# Patient Record
Sex: Male | Born: 1975 | Hispanic: No | Smoking: Current every day smoker
Health system: Southern US, Community
[De-identification: ages and names within clinical notes are randomized; demographics above are authoritative.]

## PROBLEM LIST (undated history)

## (undated) DIAGNOSIS — N189 Chronic kidney disease, unspecified: Secondary | ICD-10-CM

## (undated) DIAGNOSIS — C649 Malignant neoplasm of unspecified kidney, except renal pelvis: Secondary | ICD-10-CM

## (undated) DIAGNOSIS — M1990 Unspecified osteoarthritis, unspecified site: Secondary | ICD-10-CM

## (undated) DIAGNOSIS — I10 Essential (primary) hypertension: Secondary | ICD-10-CM

## (undated) DIAGNOSIS — F329 Major depressive disorder, single episode, unspecified: Secondary | ICD-10-CM

## (undated) DIAGNOSIS — G8929 Other chronic pain: Secondary | ICD-10-CM

## (undated) DIAGNOSIS — M109 Gout, unspecified: Secondary | ICD-10-CM

## (undated) DIAGNOSIS — E119 Type 2 diabetes mellitus without complications: Secondary | ICD-10-CM

## (undated) DIAGNOSIS — M25569 Pain in unspecified knee: Secondary | ICD-10-CM

## (undated) DIAGNOSIS — R569 Unspecified convulsions: Secondary | ICD-10-CM

## (undated) DIAGNOSIS — C801 Malignant (primary) neoplasm, unspecified: Secondary | ICD-10-CM

## (undated) DIAGNOSIS — G2581 Restless legs syndrome: Secondary | ICD-10-CM

## (undated) DIAGNOSIS — G629 Polyneuropathy, unspecified: Secondary | ICD-10-CM

## (undated) DIAGNOSIS — J45909 Unspecified asthma, uncomplicated: Secondary | ICD-10-CM

## (undated) DIAGNOSIS — F32A Depression, unspecified: Secondary | ICD-10-CM

## (undated) DIAGNOSIS — J449 Chronic obstructive pulmonary disease, unspecified: Secondary | ICD-10-CM

## (undated) HISTORY — PX: OTHER SURGICAL HISTORY: SHX169

## (undated) HISTORY — PX: PARTIAL NEPHRECTOMY: SHX414

## (undated) HISTORY — PX: KNEE SURGERY: SHX244

## (undated) HISTORY — PX: SEPTOPLASTY: SUR1290

## (undated) HISTORY — DX: Pain in unspecified knee: M25.569

## (undated) HISTORY — DX: Malignant neoplasm of unspecified kidney, except renal pelvis: C64.9

## (undated) HISTORY — DX: Other chronic pain: G89.29

## (undated) HISTORY — DX: Unspecified osteoarthritis, unspecified site: M19.90

---

## 2012-03-04 ENCOUNTER — Encounter: Payer: Self-pay | Admitting: Internal Medicine

## 2012-03-16 DIAGNOSIS — R569 Unspecified convulsions: Secondary | ICD-10-CM

## 2014-06-20 ENCOUNTER — Encounter (HOSPITAL_COMMUNITY): Payer: Self-pay | Admitting: *Deleted

## 2014-06-20 DIAGNOSIS — Z880 Allergy status to penicillin: Secondary | ICD-10-CM | POA: Diagnosis not present

## 2014-06-20 DIAGNOSIS — Z720 Tobacco use: Secondary | ICD-10-CM | POA: Insufficient documentation

## 2014-06-20 DIAGNOSIS — S4992XA Unspecified injury of left shoulder and upper arm, initial encounter: Secondary | ICD-10-CM | POA: Insufficient documentation

## 2014-06-20 DIAGNOSIS — I10 Essential (primary) hypertension: Secondary | ICD-10-CM | POA: Diagnosis not present

## 2014-06-20 DIAGNOSIS — S199XXA Unspecified injury of neck, initial encounter: Secondary | ICD-10-CM | POA: Diagnosis not present

## 2014-06-20 DIAGNOSIS — S0990XA Unspecified injury of head, initial encounter: Secondary | ICD-10-CM | POA: Insufficient documentation

## 2014-06-20 DIAGNOSIS — S3992XA Unspecified injury of lower back, initial encounter: Secondary | ICD-10-CM | POA: Insufficient documentation

## 2014-06-20 DIAGNOSIS — W07XXXA Fall from chair, initial encounter: Secondary | ICD-10-CM | POA: Diagnosis not present

## 2014-06-20 DIAGNOSIS — G8929 Other chronic pain: Secondary | ICD-10-CM | POA: Diagnosis not present

## 2014-06-20 DIAGNOSIS — M1990 Unspecified osteoarthritis, unspecified site: Secondary | ICD-10-CM | POA: Diagnosis not present

## 2014-06-20 DIAGNOSIS — G629 Polyneuropathy, unspecified: Secondary | ICD-10-CM | POA: Insufficient documentation

## 2014-06-20 DIAGNOSIS — Y998 Other external cause status: Secondary | ICD-10-CM | POA: Insufficient documentation

## 2014-06-20 DIAGNOSIS — J449 Chronic obstructive pulmonary disease, unspecified: Secondary | ICD-10-CM | POA: Diagnosis not present

## 2014-06-20 DIAGNOSIS — E119 Type 2 diabetes mellitus without complications: Secondary | ICD-10-CM | POA: Insufficient documentation

## 2014-06-20 DIAGNOSIS — Y9389 Activity, other specified: Secondary | ICD-10-CM | POA: Diagnosis not present

## 2014-06-20 DIAGNOSIS — Y92008 Other place in unspecified non-institutional (private) residence as the place of occurrence of the external cause: Secondary | ICD-10-CM | POA: Diagnosis not present

## 2014-06-20 NOTE — ED Notes (Addendum)
Pt fell out of a transfer chair - backwards ,  Pain back of neck , head, lower back ,  Headache,  ? Loc.  Alert now.   Pt outside smoking when called.  C collar placed on pt

## 2014-06-20 NOTE — ED Notes (Signed)
Pt outside , had to be called by visitor

## 2014-06-21 ENCOUNTER — Emergency Department (HOSPITAL_COMMUNITY): Payer: Medicaid Other

## 2014-06-21 ENCOUNTER — Emergency Department (HOSPITAL_COMMUNITY)
Admission: EM | Admit: 2014-06-21 | Discharge: 2014-06-21 | Discharge status: Against Medical Advice | Payer: Medicaid Other | Attending: Emergency Medicine | Admitting: Emergency Medicine

## 2014-06-21 DIAGNOSIS — W19XXXA Unspecified fall, initial encounter: Secondary | ICD-10-CM

## 2014-06-21 HISTORY — DX: Type 2 diabetes mellitus without complications: E11.9

## 2014-06-21 HISTORY — DX: Essential (primary) hypertension: I10

## 2014-06-21 HISTORY — DX: Unspecified osteoarthritis, unspecified site: M19.90

## 2014-06-21 HISTORY — DX: Polyneuropathy, unspecified: G62.9

## 2014-06-21 HISTORY — DX: Chronic obstructive pulmonary disease, unspecified: J44.9

## 2014-06-21 HISTORY — DX: Unspecified asthma, uncomplicated: J45.909

## 2014-06-21 LAB — CBG MONITORING, ED: GLUCOSE-CAPILLARY: 189 mg/dL — AB (ref 70–99)

## 2014-06-21 MED ORDER — CYCLOBENZAPRINE HCL 10 MG PO TABS
10.0000 mg | ORAL_TABLET | Freq: Once | ORAL | Status: AC
  Administered 2014-06-21: 10 mg via ORAL
  Filled 2014-06-21: qty 1

## 2014-06-21 MED ORDER — NAPROXEN 250 MG PO TABS
500.0000 mg | ORAL_TABLET | Freq: Once | ORAL | Status: AC
  Administered 2014-06-21: 500 mg via ORAL
  Filled 2014-06-21: qty 2

## 2014-06-21 NOTE — ED Notes (Signed)
Pt left after seeing EDP. States had to get his daughter & wife has court in the morning. Advised to return if any problems.

## 2014-06-21 NOTE — ED Notes (Signed)
Pt decided that he was going to have to leave after he got his medications. Pt states he had to get his daughter from grandmothers & his wife has court in the morning. Pt advised he can return at anytime to be seen if needed. Wife said she would make sure he comes back if having other issues or problems.

## 2014-06-21 NOTE — ED Provider Notes (Signed)
CSN: [id number]     Arrival date & time 06/20/14  2049 History   First MD Initiated Contact with Patient 06/21/14 [phone]     Chief Complaint  Patient presents with  . Fall     (Consider location/radiation/quality/duration/timing/severity/associated sxs/prior Treatment) HPI  Patient reports about 5 PM he was sitting in a transfer chair on his porch watching his daughter. He states one of the wheels was broken and when he started to get up out of the chair it fell backwards. He states he hit the back of his head. He is unsure of loss of consciousness. He denies nausea or vomiting but states he feels lightheaded. He has a throbbing posterior headache and also pain in his left temple which is different from his prior migraine headaches. He complains of neck pain. He also complains of pain in his left shoulder and in his lower back. Patient has chronic numbness from neuropathy and states it is unchanged. He states he has chronic low back pain from pinched nerves in his back and he goes to a pain management clinic for that. Patient states his CBGs normally run in the 150-189 range.  PCP Dr Woody [facility]  Past Medical History  Diagnosis Date  . COPD (chronic obstructive pulmonary disease)   . Diabetes mellitus without complication   . Arthritis   . Asthma   . Neuropathy   . Hypertension    Past Surgical History  Procedure Laterality Date  . Carpal tunnel release    . Pinched nerves in back     History reviewed. No pertinent family history. History  Substance Use Topics  . Smoking status: Current Every Day Smoker  . Smokeless tobacco: Not on file  . Alcohol Use: No  applying for disability  Patient uses a cane and sometimes a wheelchair for ambulation. He states he has arthritis in his knees. Patient was smoking 3 packs per day, he's down to 1 pack a day.  Review of Systems  All other systems reviewed and are negative.     Allergies  Penicillins  Home  Medications   Prior to Admission medications   Not on File   BP 118/78 mmHg  Pulse 87  Temp(Src) 98 F (36.7 C) (Oral)  Resp 16  Ht 5\' 11"  (1.803 m)  Wt 225 lb (102.059 kg)  BMI 31.39 kg/m2  SpO2 97%  Vital signs normal   Physical Exam  Constitutional: He is oriented to person, place, and time. He appears well-developed and well-nourished.  Non-toxic appearance. He does not appear ill. No distress.  HENT:  Head: Normocephalic and atraumatic.    Right Ear: External ear normal.  Left Ear: External ear normal.  Nose: Nose normal. No mucosal edema or rhinorrhea.  Mouth/Throat: Oropharynx is clear and moist and mucous membranes are normal. No dental abscesses or uvula swelling.  Patient has tenderness of his posterior scalp at the level of the top of the c-collar.  Eyes: Conjunctivae and EOM are normal. Pupils are equal, round, and reactive to light.  Neck: Full passive range of motion without pain.  Patient in a c-collar.  Cardiovascular: Normal rate, regular rhythm and normal heart sounds.  Exam reveals no gallop and no friction rub.   No murmur heard. Pulmonary/Chest: Effort normal and breath sounds normal. No respiratory distress. He has no wheezes. He has no rhonchi. He has no rales. He exhibits no tenderness and no crepitus.  Abdominal: Soft. Normal appearance and bowel sounds are normal. He  exhibits no distension. There is no tenderness. There is no rebound and no guarding.  Musculoskeletal: Normal range of motion. He exhibits no edema or tenderness.       Arms: Moves all extremities well. Patient does not have pain in his left shoulder joint but states he has pain in the left trapezius that radiates up into the left side of his neck when he moves his left arm. He also is tender over his left clavicle without deformity or crepitance. There is no swelling seen. There is no bruising seen to his back. He is tender diffusely his whole lumbar spine and over the left SI joint. His  thoracic spine is nontender.  Neurological: He is alert and oriented to person, place, and time. He has normal strength. No cranial nerve deficit.  Skin: Skin is warm, dry and intact. No rash noted. No erythema. No pallor.  Psychiatric: He has a normal mood and affect. His speech is normal and behavior is normal. His mood appears not anxious.  Nursing note and vitals reviewed.   ED Course  Procedures (including critical care time)  Medications  naproxen (NAPROSYN) tablet 500 mg (500 mg Oral Given 06/21/14 0257)  cyclobenzaprine (FLEXERIL) tablet 10 mg (10 mg Oral Given 06/21/14 0257)     Patient was fine when I left the room. However he was informed by nursing staff that x-ray was slow tonight getting the results back. Patient decided he did not want to wait. He states his wife has a court appearance 9:30 this morning. Pt was seen walking out of the ED without his cervical collar.   Review of the [place] shows patient gets #120 hydrocodone 7.5/325 monthly, the last was filled on November 27. Are prescribed from the [facility]. He is also prescribed Valium 5 mg tablets and Ambien.    Labs Review Labs Reviewed  CBG MONITORING, ED - Abnormal; Notable for the following:    Glucose-Capillary 189 (*)    All other components within normal limits    Imaging Review No results found.  X-rays were ordered of his left clavicle, lumbar spine, cervical spine, and CT of his head. Patient did not have these done.   EKG Interpretation None      MDM   Final diagnoses:  Fall    Pt left AMA   Rolland Porter, MD, Abram Sander     Janice Norrie, MD 06/21/14 [phone]

## 2014-06-21 NOTE — ED Notes (Signed)
Pt wanting to go outside to smoke. Pt informed that if he leaves he will be signing out against medical advise. Pt then requested to speak to the person in charge of the department. Charge nurse advised & went to see pt.

## 2014-06-21 NOTE — ED Notes (Signed)
Pt states fell back in a chair, hitting his head & left shoulder. Pt with good movement & sensation in upper ext. Pt ambulated to treatment room. Neuro is intact, PERRLA. Pt still has c collar in place & was advised to keep on until seen by EDP.

## 2014-08-04 ENCOUNTER — Encounter: Payer: Medicaid Other | Attending: "Endocrinology | Admitting: Nutrition

## 2014-09-21 ENCOUNTER — Telehealth: Payer: Self-pay | Admitting: Nutrition

## 2014-09-21 ENCOUNTER — Encounter: Payer: Medicaid Other | Attending: "Endocrinology | Admitting: Nutrition

## 2014-09-21 NOTE — Telephone Encounter (Signed)
Called and left message on answering machine to call [phone] to reschedule missed appointment. NS letter sent to PCP. Jearld Fenton, RDN, CDE

## 2015-04-10 ENCOUNTER — Other Ambulatory Visit: Payer: Self-pay | Admitting: "Endocrinology

## 2015-04-19 ENCOUNTER — Ambulatory Visit: Payer: Medicaid Other | Admitting: Nutrition

## 2015-04-19 ENCOUNTER — Telehealth: Payer: Self-pay | Admitting: Nutrition

## 2015-04-19 NOTE — Telephone Encounter (Signed)
VM to return call for missed appt. PC 

## 2015-04-21 ENCOUNTER — Other Ambulatory Visit: Payer: Self-pay | Admitting: "Endocrinology

## 2015-04-28 ENCOUNTER — Ambulatory Visit: Payer: Self-pay | Admitting: "Endocrinology

## 2015-05-08 ENCOUNTER — Encounter: Payer: Self-pay | Admitting: "Endocrinology

## 2015-05-19 ENCOUNTER — Other Ambulatory Visit: Payer: Self-pay | Admitting: "Endocrinology

## 2015-06-12 ENCOUNTER — Other Ambulatory Visit: Payer: Self-pay | Admitting: "Endocrinology

## 2015-07-17 ENCOUNTER — Other Ambulatory Visit: Payer: Self-pay | Admitting: "Endocrinology

## 2015-07-24 ENCOUNTER — Other Ambulatory Visit: Payer: Self-pay | Admitting: "Endocrinology

## 2015-08-21 ENCOUNTER — Other Ambulatory Visit: Payer: Self-pay | Admitting: "Endocrinology

## 2015-08-22 ENCOUNTER — Other Ambulatory Visit: Payer: Self-pay | Admitting: "Endocrinology

## 2015-08-25 ENCOUNTER — Other Ambulatory Visit: Payer: Self-pay | Admitting: "Endocrinology

## 2015-10-18 ENCOUNTER — Ambulatory Visit (INDEPENDENT_AMBULATORY_CARE_PROVIDER_SITE_OTHER): Payer: Medicaid Other | Admitting: Orthopaedic Surgery

## 2015-10-18 ENCOUNTER — Encounter: Payer: Self-pay | Admitting: Orthopaedic Surgery

## 2015-10-18 VITALS — BP 93/62 | HR 86 | Temp 97.5°F | Resp 16 | Ht 70.0 in | Wt 221.0 lb

## 2015-10-18 DIAGNOSIS — I10 Essential (primary) hypertension: Secondary | ICD-10-CM

## 2015-10-18 DIAGNOSIS — M25561 Pain in right knee: Secondary | ICD-10-CM

## 2015-10-18 DIAGNOSIS — E119 Type 2 diabetes mellitus without complications: Secondary | ICD-10-CM | POA: Diagnosis not present

## 2015-10-18 MED ORDER — HYDROCODONE-ACETAMINOPHEN 5-325 MG PO TABS
1.0000 | ORAL_TABLET | ORAL | Status: DC | PRN
Start: 2015-10-18 — End: 2015-11-16

## 2015-10-18 NOTE — Progress Notes (Signed)
Subjective: my right knee hurts    Patient ID: Matthew Dougherty, male    DOB: 28-Aug-1975, 40 y.o.   MRN: [id number]  Knee Pain  There was no injury mechanism. The pain is present in the right knee. The quality of the pain is described as aching. The pain is at a severity of 5/10. The pain is moderate. The pain has been worsening since onset. Associated symptoms include an inability to bear weight and a loss of motion. Pertinent negatives include no loss of sensation, muscle weakness, numbness or tingling. The symptoms are aggravated by weight bearing. He has tried ice, elevation, heat, non-weight bearing, rest and NSAIDs for the symptoms. The treatment provided mild relief.   He has had pain in the right knee for over six weeks.  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  He has had giving way of the knee.  He has swelling and popping.  He has no redness.  He has no trauma.   Review of Systems  Constitutional:       He has hypertension.  He has diabetes mellitus on insulin.  He has shortness of breath.  He smokes and is not willing to quit.  HENT: Negative for congestion.   Respiratory: Positive for shortness of breath. Negative for cough.   Cardiovascular: Negative for chest pain and leg swelling.  Endocrine: Positive for cold intolerance.  Musculoskeletal: Positive for joint swelling, arthralgias and gait problem.  Allergic/Immunologic: Positive for environmental allergies.  Neurological: Negative for tingling and numbness.   Past Medical History  Diagnosis Date  . COPD (chronic obstructive pulmonary disease) ([place])   . Diabetes mellitus without complication ([place])   . Arthritis   . Asthma   . Neuropathy ([place])   . Hypertension    Past Surgical History  Procedure Laterality Date  . Carpal tunnel release    . Pinched nerves in back     Social History   Social History  . Marital Status: Unknown    Spouse Name:  N/A  . Number of Children: N/A  . Years of Education: N/A   Occupational History  . Not on file.   Social History Main Topics  . Smoking status: Current Every Day Smoker  . Smokeless tobacco: Not on file  . Alcohol Use: No  . Drug Use: No  . Sexual Activity: Not on file   Other Topics Concern  . Not on file   Social History Narrative   BP 93/62 mmHg  Pulse 86  Temp(Src) 97.5 F (36.4 C)  Resp 16  Ht 5\' 10"  (1.778 m)  Wt 221 lb (100.245 kg)  BMI 31.71 kg/m2     Objective:   Physical Exam  Constitutional: He is oriented to person, place, and time. He appears well-developed and well-nourished.  HENT:  Head: Normocephalic and atraumatic.  Eyes: Conjunctivae and EOM are normal. Pupils are equal, round, and reactive to light.  Neck: Normal range of motion. Neck supple.  Cardiovascular: Normal rate, regular rhythm and intact distal pulses.   Pulmonary/Chest: Effort normal.  Abdominal: Soft.  Musculoskeletal: He exhibits tenderness (pain of right knee with 1+ effusion.  He has more pain medially, ROM 0 to 100 with crepitus.  Weakly positive medial Mcmurray.).       Legs: Neurological: He is alert and oriented to person, place, and time. He has normal reflexes. No cranial nerve deficit. He exhibits normal muscle tone. Coordination normal.  Skin:  Skin is warm and dry.  Psychiatric: He has a normal mood and affect. His behavior is normal. Judgment and thought content normal.   Left knee is not tender and has full motion.     Assessment & Plan:   Encounter Diagnoses  Name Primary?  . Right knee pain   . Essential hypertension Yes  . Diabetes mellitus without complication ([place])    I would like to get a MRI of the right knee.  Rx given for pain.  PROCEDURE NOTE:  The patient requests injections of the right knee , verbal consent was obtained.  The right knee was prepped appropriately after time out was performed.   Sterile technique was observed and injection of 1  cc of Depo-Medrol 40 mg with several cc's of plain xylocaine. Anesthesia was provided by ethyl chloride and a 20-gauge needle was used to inject the knee area. The injection was tolerated well.  A band aid dressing was applied.  The patient was advised to apply ice later today and tomorrow to the injection sight as needed.  Return after MRI of the right knee.

## 2015-10-18 NOTE — Patient Instructions (Signed)
Get MRI of the right knee.  Return after MRI.

## 2015-11-01 ENCOUNTER — Telehealth: Payer: Self-pay | Admitting: Orthopaedic Surgery

## 2015-11-01 NOTE — Telephone Encounter (Signed)
Patient called, left message inquiring about status of MRI / pre-authorization?  His ph# is [phone]; tried returning call to let patient know it is in progress; ph# not going through.

## 2015-11-02 NOTE — Telephone Encounter (Signed)
Spoke with patient and advised that medicaid denied the MRI. I told him that I would try to get a peer to peer with DR. Keeling, and we would let him know

## 2015-11-13 ENCOUNTER — Other Ambulatory Visit: Payer: Self-pay | Admitting: "Endocrinology

## 2015-11-15 ENCOUNTER — Other Ambulatory Visit: Payer: Self-pay | Admitting: "Endocrinology

## 2015-11-16 ENCOUNTER — Ambulatory Visit (INDEPENDENT_AMBULATORY_CARE_PROVIDER_SITE_OTHER): Payer: Medicaid Other | Admitting: Orthopaedic Surgery

## 2015-11-16 ENCOUNTER — Encounter: Payer: Self-pay | Admitting: Orthopaedic Surgery

## 2015-11-16 VITALS — BP 113/70 | HR 82 | Temp 97.9°F | Ht 70.0 in | Wt 221.0 lb

## 2015-11-16 DIAGNOSIS — I10 Essential (primary) hypertension: Secondary | ICD-10-CM

## 2015-11-16 DIAGNOSIS — M25561 Pain in right knee: Secondary | ICD-10-CM | POA: Diagnosis not present

## 2015-11-16 DIAGNOSIS — E119 Type 2 diabetes mellitus without complications: Secondary | ICD-10-CM | POA: Diagnosis not present

## 2015-11-16 MED ORDER — HYDROCODONE-ACETAMINOPHEN 5-325 MG PO TABS: 1.0000 | ORAL_TABLET | ORAL | Status: DC | PRN

## 2015-11-16 NOTE — Progress Notes (Signed)
Patient [id number] Matthew Dougherty, male DOB:[birth year], 40 y.o. [id number]  Chief Complaint  Patient presents with  . Follow-up    right knee pain     HPI  Matthew Dougherty is a 40 y.o. male who has right knee pain, giving way, swelling, popping, no improvement with therapy.  He has a knee brace and uses a cane.  He has had injection in the knee with no help on last visit 10-17-15.  He was seen in the ER at [facility] on 09-05-15 for the knee pain.  He had x-rays done then.  He has gone over six weeks with therapy, rest, conservative treatment, injection, brace, cane, etc with no improvement.  I feel he needs MRI of the knee.  MRI was denied by Medicaid as not meting the conservative treatment.  It is documented.  I feel he needs MRI.  MRI requested again.  He has giving way of the right knee, I feel he has a medial meniscus tear.  I think he may need surgery.   HPI  Body mass index is 31.71 kg/(m^2).  Review of Systems  Constitutional:       He has hypertension.  He has diabetes mellitus on insulin.  He has shortness of breath.  He smokes and is not willing to quit.  HENT: Negative for congestion.   Respiratory: Positive for shortness of breath. Negative for cough.   Cardiovascular: Negative for chest pain and leg swelling.  Endocrine: Positive for cold intolerance.  Musculoskeletal: Positive for joint swelling, arthralgias and gait problem.  Allergic/Immunologic: Positive for environmental allergies.  Neurological: Negative for numbness.    Past Medical History  Diagnosis Date  . COPD (chronic obstructive pulmonary disease) ([place])   . Diabetes mellitus without complication ([place])   . Arthritis   . Asthma   . Neuropathy ([place])   . Hypertension     Past Surgical History  Procedure Laterality Date  . Carpal tunnel release    . Pinched nerves in back      History reviewed. No pertinent family history.  Social History Social History  Substance Use Topics  . Smoking  status: Current Every Day Smoker  . Smokeless tobacco: None  . Alcohol Use: No    Allergies  Allergen Reactions  . Penicillins     Current Outpatient Prescriptions  Medication Sig Dispense Refill  . gabapentin (NEURONTIN) 300 MG capsule Take 300 mg by mouth 2 (two) times daily.     Marland Kitchen HYDROcodone-acetaminophen (NORCO/VICODIN) 5-325 MG tablet Take 1 tablet by mouth every 4 (four) hours as needed for moderate pain (Must last 30 days.  Do not take and drive a car or use machinery.). 120 tablet 0  . INVOKANA 100 MG TABS tablet TAKE (1) TABLET BY MOUTH ONCE DAILY. 30 tablet 0  . lisinopril (PRINIVIL,ZESTRIL) 10 MG tablet Take 10 mg by mouth daily.    Marland Kitchen NOVOLOG FLEXPEN 100 UNIT/ML FlexPen INJECT 10 UNITS SUBCUTANEOUSLY 3 TIMES DAILY BEFORE MEALS. 15 mL 0   No current facility-administered medications for this visit.     Physical Exam  Blood pressure 113/70, pulse 82, temperature 97.9 F (36.6 C), height 5\' 10"  (1.778 m), weight 221 lb (100.245 kg).  Constitutional: overall normal hygiene, normal nutrition, well developed, normal grooming, normal body habitus. Assistive device:cane  Musculoskeletal: gait and station Limp right, muscle tone and strength are normal, no tremors or atrophy is present.  .  Neurological: coordination overall normal.  Deep tendon reflex/nerve stretch intact.  Sensation normal.  Cranial nerves II-XII intact.   Skin:   normal overall no scars, lesions, ulcers or rashes. No psoriasis.  Psychiatric: Alert and oriented x 3.  Recent memory intact, remote memory unclear.  Normal mood and affect. Well groomed.  Good eye contact.  Cardiovascular: overall no swelling, no varicosities, no edema bilaterally, normal temperatures of the legs and arms, no clubbing, cyanosis and good capillary refill.  Lymphatic: palpation is normal.  The right lower extremity is examined:  Inspection:  Thigh:  Non-tender and no defects  Knee has swelling 2+ effusion.                         Joint tenderness is present                        Patient is tender over the medial joint line  Lower Leg:  Has normal appearance and no tenderness or defects  Ankle:  Non-tender and no defects  Foot:  Non-tender and no defects Range of Motion:  Knee:  Range of motion is: 0-100                        Crepitus is  present  Ankle:  Range of motion is normal. Strength and Tone:  The right lower extremity has normal strength and tone. Stability:  Knee:  The knee has positive McMurray medially.  Ankle:  The ankle is stable.  The left knee is negative.  The patient has been educated about the nature of the problem(s) and counseled on treatment options.  The patient appeared to understand what I have discussed and is in agreement with it.  Encounter Diagnoses  Name Primary?  . Right knee pain Yes  . Essential hypertension   . Diabetes mellitus without complication [facility])     PLAN Call if any problems.  Precautions discussed.  Continue current medications.   Return to clinic after MRI of the right knee.

## 2015-11-16 NOTE — Patient Instructions (Signed)
WE WILL SCHEDULE MRI FOR YOU AND CALL YOU WITH APPT

## 2015-11-22 ENCOUNTER — Telehealth: Payer: Self-pay | Admitting: Nutrition

## 2015-11-22 NOTE — Telephone Encounter (Signed)
Talked to pt's wife who stated family issues have prevented for scheduling appt. Will call for appt at a later time.

## 2015-11-28 ENCOUNTER — Ambulatory Visit (HOSPITAL_COMMUNITY)
Admission: RE | Admit: 2015-11-28 | Discharge: 2015-11-28 | Disposition: A | Discharge status: Home / Self Care | Payer: Medicaid Other | Source: Ambulatory Visit | Attending: Orthopaedic Surgery | Admitting: Orthopaedic Surgery

## 2015-11-28 DIAGNOSIS — S83206A Unspecified tear of unspecified meniscus, current injury, right knee, initial encounter: Secondary | ICD-10-CM | POA: Diagnosis not present

## 2015-11-28 DIAGNOSIS — X58XXXA Exposure to other specified factors, initial encounter: Secondary | ICD-10-CM | POA: Insufficient documentation

## 2015-11-28 DIAGNOSIS — M179 Osteoarthritis of knee, unspecified: Secondary | ICD-10-CM | POA: Insufficient documentation

## 2015-11-28 DIAGNOSIS — S83511S Sprain of anterior cruciate ligament of right knee, sequela: Secondary | ICD-10-CM | POA: Insufficient documentation

## 2015-11-28 DIAGNOSIS — M25561 Pain in right knee: Secondary | ICD-10-CM | POA: Diagnosis present

## 2015-11-30 ENCOUNTER — Ambulatory Visit (INDEPENDENT_AMBULATORY_CARE_PROVIDER_SITE_OTHER): Payer: Medicaid Other | Admitting: Orthopaedic Surgery

## 2015-11-30 ENCOUNTER — Encounter: Payer: Self-pay | Admitting: Orthopaedic Surgery

## 2015-11-30 VITALS — BP 117/82 | HR 112 | Temp 98.1°F | Ht 70.0 in | Wt 221.0 lb

## 2015-11-30 DIAGNOSIS — I10 Essential (primary) hypertension: Secondary | ICD-10-CM

## 2015-11-30 DIAGNOSIS — M25561 Pain in right knee: Secondary | ICD-10-CM | POA: Diagnosis not present

## 2015-11-30 DIAGNOSIS — E119 Type 2 diabetes mellitus without complications: Secondary | ICD-10-CM

## 2015-11-30 MED ORDER — DICLOFENAC SODIUM 1 % TD GEL: 4.0000 g | Freq: Four times a day (QID) | TRANSDERMAL | Status: DC

## 2015-11-30 NOTE — Patient Instructions (Signed)
Refer to see Dr. Aline Brochure as soon as possible.

## 2015-11-30 NOTE — Progress Notes (Signed)
CC:  I have pain of my right knee. I would like an injection.  The patient has chronic pain of the right knee.  There is no recent trauma.  There is no redness.  Injections in the past have helped.  The knee has no redness, has an effusion and crepitus present.  ROM of the knee is 0-105.  Impression:  Chronic knee pain right.  He also had MRI of the right knee just done 11-28-15 showing: IMPRESSION: 1. 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 Several intra-articular loose bodies are suspected. 2. 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 Associated posterior PCL buckling. 3. Extensive degenerative tearing of the medial meniscus with probable meniscal fragment anteriorly. 4. Possible small longitudinal tear involving the superior articular surface of the posterior horn of the lateral meniscus. 5. No acute osseous findings.  He will need arthroscopy of the knee.  I will have Dr. Aline Brochure see him.  PROCEDURE NOTE:  The patient requests injections of the right knee , verbal consent was obtained.  The right knee was prepped appropriately after time out was performed.   Sterile technique was observed and injection of 1 cc of Depo-Medrol 40 mg with several cc's of plain xylocaine. Anesthesia was provided by ethyl chloride and a 20-gauge needle was used to inject the knee area. The injection was tolerated well.  A band aid dressing was applied.  The patient was advised to apply ice later today and tomorrow to the injection sight as needed.

## 2015-12-12 ENCOUNTER — Other Ambulatory Visit: Payer: Self-pay | Admitting: "Endocrinology

## 2015-12-14 ENCOUNTER — Telehealth: Payer: Self-pay | Admitting: Orthopaedic Surgery

## 2015-12-14 MED ORDER — HYDROCODONE-ACETAMINOPHEN 5-325 MG PO TABS: 1.0000 | ORAL_TABLET | ORAL | Status: DC | PRN

## 2015-12-14 NOTE — Telephone Encounter (Signed)
Hydrocodone-Acetaminophen  5/325mg  Qty 120 Tablets °

## 2015-12-14 NOTE — Telephone Encounter (Signed)
Rx done. 

## 2015-12-18 ENCOUNTER — Ambulatory Visit: Payer: Medicaid Other | Admitting: Orthopedic Surgery

## 2015-12-19 ENCOUNTER — Other Ambulatory Visit: Payer: Self-pay | Admitting: "Endocrinology

## 2015-12-20 ENCOUNTER — Ambulatory Visit: Payer: Medicaid Other | Admitting: Orthopedic Surgery

## 2015-12-25 ENCOUNTER — Ambulatory Visit (INDEPENDENT_AMBULATORY_CARE_PROVIDER_SITE_OTHER): Payer: Medicaid Other | Admitting: Orthopedic Surgery

## 2015-12-25 VITALS — BP 120/82 | HR 88 | Ht 70.0 in | Wt 217.6 lb

## 2015-12-25 DIAGNOSIS — M25561 Pain in right knee: Secondary | ICD-10-CM

## 2015-12-25 DIAGNOSIS — M1711 Unilateral primary osteoarthritis, right knee: Secondary | ICD-10-CM

## 2015-12-25 DIAGNOSIS — M238X1 Other internal derangements of right knee: Secondary | ICD-10-CM

## 2015-12-25 DIAGNOSIS — S83281A Other tear of lateral meniscus, current injury, right knee, initial encounter: Secondary | ICD-10-CM

## 2015-12-25 DIAGNOSIS — S83241A Other tear of medial meniscus, current injury, right knee, initial encounter: Secondary | ICD-10-CM

## 2015-12-25 NOTE — Patient Instructions (Addendum)
Your surgery will be scheduled and the nurse will be calling you with surgery dates and pre and post op appointments.Knee Arthroscopy Knee arthroscopy is a surgical procedure that is used to examine the inside of your knee joint and repair any damage. The surgeon puts a small, lighted instrument with a camera on the tip (arthroscope) through a small incision in your knee. The camera sends pictures to a monitor in the operating room. Your surgeon uses those pictures to guide the surgical instruments through other incisions to the area of damage. Knee arthroscopy can be used to treat many types of knee problems. It may be used:  To repair a torn ligament.  To repair or remove damaged tissue.  To remove a fluid-filled sac (cyst) from your knee. LET [facility] CARE PROVIDER KNOW ABOUT:  Any allergies you have.  All medicines you are taking, including vitamins, herbs, eye drops, creams, and over-the-counter medicines.  Previous problems you or members of your family have had with the use of anesthetics.  Any blood disorders you have.  Previous surgeries you have had.  Any medical conditions you may have. RISKS AND COMPLICATIONS Generally, this is a safe procedure. However, problems may occur, including:  Infection.  Bleeding.  Damage to blood vessels, nerves, or structures of your knee.  A blood clot that forms in your leg and travels to your lung.  Failure to relieve symptoms. BEFORE THE PROCEDURE  Ask your health care provider about:  Changing or stopping your regular medicines. This is especially important if you are taking diabetes medicines or blood thinners.  Taking medicines such as aspirin and ibuprofen. These medicines can thin your blood. Do not take these medicines before your procedure if your health care provider instructs you not to.  Follow your health care provider's instructions about eating or drinking restrictions.  Plan to have someone take you home after  the procedure.  If you go home right after the procedure, plan to have someone with you for 24 hours.  Do not drink alcohol unless your health care provider says that you can.  Do not use any tobacco products, including cigarettes, chewing tobacco, or electronic cigarettes unless your health care provider says that you can. If you need help quitting, ask your health care provider.  You may have a physical exam. PROCEDURE  An IV tube will be inserted into one of your veins.  You will be given one or more of the following:  A medicine that helps you relax (sedative).  A medicine that numbs the area (local anesthetic).  A medicine that makes you fall asleep (general anesthetic).  A medicine that is injected into your spine that numbs the area below and slightly above the injection site (spinal anesthetic).  A medicine that is injected into an area of your body that numbs everything below the injection site (regional anesthetic).  A cuff may be placed around your upper leg to slow bleeding during the procedure.  The surgeon will make a small number of incisions around your knee.  Your knee joint will be flushed and filled with a germ-free (sterile) solution.  The arthroscope will be passed through an incision into your knee joint.  More instruments will be passed through other incisions to repair your knee as needed.  The fluid will be removed from your knee.  The incisions will be closed with adhesive strips or stitches (sutures).  A bandage (dressing) will be placed over your knee. The procedure may vary among  health care providers and hospitals. AFTER THE PROCEDURE  Your blood pressure, heart rate, breathing rate and blood oxygen level will be monitored often until the medicines you were given have worn off.  You may be given medicine for pain.  You may get crutches to help you walk without using your knee to support your body weight.  You may have to wear compression  stockings. These stocking help to prevent blood clots and reduce swelling in your legs.   This information is not intended to replace advice given to you by your health care provider. Make sure you discuss any questions you have with your health care provider.   Document Released: 06/21/2000 Document Revised: 11/08/2014 Document Reviewed: 06/20/2014 Elsevier Interactive Patient Education [employer].

## 2015-12-25 NOTE — Progress Notes (Signed)
Patient ID: Matthew Dougherty, male   DOB: 1976-04-09, 40 y.o.   MRN: [id number]  Chief Complaint  Patient presents with  . Follow-up    Right knee, Discuss surgery, patient of Dr. Luna Glasgow    HPI 40 year old male with long-standing anterior cruciate ligament deficient knee presents for possible arthroscopic surgery on the right knee complaining of pain popping locking and instability. Patient had a recent instability episode within the last 2 weeks. He has used a cane outside of his house for the last 5 years and he also uses a knee brace to help with stability  He had an MRI which showed severe arthritis in the right knee loose bodies as well as anterior cruciate ligament deficiency torn lateral meniscus degenerative tear medial meniscus  ROS  Occasional shortness of breath from COPD Abnormal sensation most likely from neuropathy    Past Medical History  Diagnosis Date  . COPD (chronic obstructive pulmonary disease) ([place])   . Diabetes mellitus without complication ([place])   . Arthritis   . Asthma   . Neuropathy ([place])   . Hypertension     BP 120/82 mmHg  Pulse 88  Ht 5\' 10"  (1.778 m)  Wt 217 lb 9.6 oz (98.703 kg)  BMI 31.22 kg/m2  Physical Exam Physical Exam  Constitutional: The patient appears well-developed and well-nourished. No distress.  The patient is oriented to person, place, and time.  Psychiatric: The patient has a normal mood and affect.  Cardiovascular: Intact distal pulses.   right and left foot  Neurological: sensation is abnormal in both feet  Skin: Right and left foot Skin is warm and dry. No rash noted. The patient is not diaphoretic. No erythema. No pallor.    Ortho Exam  Left knee he says he had injury there with deficiency of the ligament but on my exam he has full range of motion no tenderness collateral ligaments and cruciate ligaments are stable muscle tone is normal.  On the right knee has anterior cruciate ligament deficiency positive Lockman  positive drawer test anteriorly flexion to 100 extension to 0 medial compartment tenderness muscle tone is normal. Palpable joint line tenderness in the medial compartment as well with positive McMurray sign on the medial side  ASSESSMENT AND PLAN   I reviewed his MRI and Dr. Brooke Bonito notes there included an incorporated by reference  He has failed conservative treatment including bracing hydrocodone Voltaren gel ibuprofen and bracing  He's 40 years old recommend arthroscopic meniscectomy medial and lateral chondroplasties as needed. I do not recommend anterior cruciate ligament reconstruction in this low demand patient. He will need to be braced postoperatively. I expect a 60-70% pain relief with this surgery an avid ice to move such  Risks versus benefits and imponderables have been discussed with the patient This procedure has been fully reviewed with the patient and written informed consent has been obtained.   Plan arthroscopy right knee medial and lateral meniscectomy   Arther Abbott, MD 12/25/2015 9:49 AM

## 2015-12-28 ENCOUNTER — Other Ambulatory Visit: Payer: Self-pay | Admitting: *Deleted

## 2016-01-01 ENCOUNTER — Other Ambulatory Visit: Payer: Self-pay | Admitting: "Endocrinology

## 2016-01-03 NOTE — Patient Instructions (Signed)
Matthew Dougherty  01/03/2016     @PREFPERIOPPHARMACY @   Your procedure is scheduled on 01/11/2016.  Report to [facility] at 7:30 A.M.  Call this number if you have problems the morning of surgery:  [phone]   Remember:  Do not eat food or drink liquids after midnight.  Take these medicines the morning of surgery with A SIP OF WATER Gaapentin, Lisinopril, Hydrocodone if needed, Voltaren  DO NOT TAKE DIABETIC MEDICATIONS MORNING OF PROCEDURE  TAKE ONLY 1/2 EVENING DOSE OF INSULIN NIGHT PRIOR TO PROCEDURE.   Do not wear jewelry, make-up or nail polish.  Do not wear lotions, powders, or perfumes.  You may wear deoderant.  Do not shave 48 hours prior to surgery.  Men may shave face and neck.  Do not bring valuables to the hospital.  [facility] is not responsible for any belongings or valuables.  Contacts, dentures or bridgework may not be worn into surgery.  Leave your suitcase in the car.  After surgery it may be brought to your room.  For patients admitted to the hospital, discharge time will be determined by your treatment team.  Patients discharged the day of surgery will not be allowed to drive home.    Please read over the following fact sheets that you were given. Surgical Site Infection Prevention and Anesthesia Post-op Instructions     PATIENT INSTRUCTIONS POST-ANESTHESIA  IMMEDIATELY FOLLOWING SURGERY:  Do not drive or operate machinery for the first twenty four hours after surgery.  Do not make any important decisions for twenty four hours after surgery or while taking narcotic pain medications or sedatives.  If you develop intractable nausea and vomiting or a severe headache please notify your doctor immediately.  FOLLOW-UP:  Please make an appointment with your surgeon as instructed. You do not need to follow up with anesthesia unless specifically instructed to do so.  WOUND CARE INSTRUCTIONS (if applicable):  Keep a dry clean dressing on the  anesthesia/puncture wound site if there is drainage.  Once the wound has quit draining you may leave it open to air.  Generally you should leave the bandage intact for twenty four hours unless there is drainage.  If the epidural site drains for more than 36-48 hours please call the anesthesia department.  QUESTIONS?:  Please feel free to call your physician or the hospital operator if you have any questions, and they will be happy to assist you.      Knee Arthroscopy Knee arthroscopy is a surgical procedure that is used to examine the inside of your knee joint and repair any damage. The surgeon puts a small, lighted instrument with a camera on the tip (arthroscope) through a small incision in your knee. The camera sends pictures to a monitor in the operating room. Your surgeon uses those pictures to guide the surgical instruments through other incisions to the area of damage. Knee arthroscopy can be used to treat many types of knee problems. It may be used:  To repair a torn ligament.  To repair or remove damaged tissue.  To remove a fluid-filled sac (cyst) from your knee. LET [facility] CARE PROVIDER KNOW ABOUT:  Any allergies you have.  All medicines you are taking, including vitamins, herbs, eye drops, creams, and over-the-counter medicines.  Previous problems you or members of your family have had with the use of anesthetics.  Any blood disorders you have.  Previous surgeries you have had.  Any medical conditions you may have. RISKS AND  COMPLICATIONS Generally, this is a safe procedure. However, problems may occur, including:  Infection.  Bleeding.  Damage to blood vessels, nerves, or structures of your knee.  A blood clot that forms in your leg and travels to your lung.  Failure to relieve symptoms. BEFORE THE PROCEDURE  Ask your health care provider about:  Changing or stopping your regular medicines. This is especially important if you are taking diabetes medicines  or blood thinners.  Taking medicines such as aspirin and ibuprofen. These medicines can thin your blood. Do not take these medicines before your procedure if your health care provider instructs you not to.  Follow your health care provider's instructions about eating or drinking restrictions.  Plan to have someone take you home after the procedure.  If you go home right after the procedure, plan to have someone with you for 24 hours.  Do not drink alcohol unless your health care provider says that you can.  Do not use any tobacco products, including cigarettes, chewing tobacco, or electronic cigarettes unless your health care provider says that you can. If you need help quitting, ask your health care provider.  You may have a physical exam. PROCEDURE  An IV tube will be inserted into one of your veins.  You will be given one or more of the following:  A medicine that helps you relax (sedative).  A medicine that numbs the area (local anesthetic).  A medicine that makes you fall asleep (general anesthetic).  A medicine that is injected into your spine that numbs the area below and slightly above the injection site (spinal anesthetic).  A medicine that is injected into an area of your body that numbs everything below the injection site (regional anesthetic).  A cuff may be placed around your upper leg to slow bleeding during the procedure.  The surgeon will make a small number of incisions around your knee.  Your knee joint will be flushed and filled with a germ-free (sterile) solution.  The arthroscope will be passed through an incision into your knee joint.  More instruments will be passed through other incisions to repair your knee as needed.  The fluid will be removed from your knee.  The incisions will be closed with adhesive strips or stitches (sutures).  A bandage (dressing) will be placed over your knee. The procedure may vary among health care providers and  hospitals. AFTER THE PROCEDURE  Your blood pressure, heart rate, breathing rate and blood oxygen level will be monitored often until the medicines you were given have worn off.  You may be given medicine for pain.  You may get crutches to help you walk without using your knee to support your body weight.  You may have to wear compression stockings. These stocking help to prevent blood clots and reduce swelling in your legs.   This information is not intended to replace advice given to you by your health care provider. Make sure you discuss any questions you have with your health care provider.   Document Released: 06/21/2000 Document Revised: 11/08/2014 Document Reviewed: 06/20/2014 Elsevier Interactive Patient Education [employer].

## 2016-01-05 ENCOUNTER — Encounter (HOSPITAL_COMMUNITY)
Admission: RE | Admit: 2016-01-05 | Discharge: 2016-01-05 | Disposition: A | Discharge status: Home / Self Care | Payer: Medicaid Other | Source: Ambulatory Visit | Attending: Orthopedic Surgery | Admitting: Orthopedic Surgery

## 2016-01-05 ENCOUNTER — Encounter (HOSPITAL_COMMUNITY): Payer: Self-pay

## 2016-01-05 DIAGNOSIS — Z01810 Encounter for preprocedural cardiovascular examination: Secondary | ICD-10-CM | POA: Insufficient documentation

## 2016-01-05 DIAGNOSIS — Z01812 Encounter for preprocedural laboratory examination: Secondary | ICD-10-CM | POA: Insufficient documentation

## 2016-01-05 HISTORY — DX: Malignant (primary) neoplasm, unspecified: C80.1

## 2016-01-05 HISTORY — DX: Major depressive disorder, single episode, unspecified: F32.9

## 2016-01-05 HISTORY — DX: Restless legs syndrome: G25.81

## 2016-01-05 HISTORY — DX: Chronic kidney disease, unspecified: N18.9

## 2016-01-05 HISTORY — DX: Unspecified convulsions: R56.9

## 2016-01-05 HISTORY — DX: Depression, unspecified: F32.A

## 2016-01-05 LAB — CBC
HCT: 48.7 % (ref 39.0–52.0)
HEMOGLOBIN: 17 g/dL (ref 13.0–17.0)
MCH: 32.5 pg (ref 26.0–34.0)
MCHC: 34.9 g/dL (ref 30.0–36.0)
MCV: 93.1 fL (ref 78.0–100.0)
Platelets: 261 10*3/uL (ref 150–400)
RBC: 5.23 MIL/uL (ref 4.22–5.81)
RDW: 13.2 % (ref 11.5–15.5)
WBC: 10.2 10*3/uL (ref 4.0–10.5)

## 2016-01-05 LAB — BASIC METABOLIC PANEL
Anion gap: 7 (ref 5–15)
BUN: 14 mg/dL (ref 6–20)
CHLORIDE: 98 mmol/L — AB (ref 101–111)
CO2: 25 mmol/L (ref 22–32)
CREATININE: 0.71 mg/dL (ref 0.61–1.24)
Calcium: 9.6 mg/dL (ref 8.9–10.3)
Glucose, Bld: 365 mg/dL — ABNORMAL HIGH (ref 65–99)
POTASSIUM: 4.7 mmol/L (ref 3.5–5.1)
SODIUM: 130 mmol/L — AB (ref 135–145)

## 2016-01-05 NOTE — Pre-Procedure Instructions (Signed)
Patient given information to sign up for my chart at home. 

## 2016-01-05 NOTE — Pre-Procedure Instructions (Signed)
Dr Patsey Berthold aware of history and glucose level of 365. Will do fasting CBG on arrival am of surgery.

## 2016-01-11 ENCOUNTER — Encounter (HOSPITAL_COMMUNITY): Payer: Self-pay | Admitting: Anesthesiology

## 2016-01-11 ENCOUNTER — Ambulatory Visit (HOSPITAL_COMMUNITY)
Admission: RE | Admit: 2016-01-11 | Discharge: 2016-01-11 | Disposition: A | Discharge status: Home / Self Care | Payer: Medicaid Other | Source: Ambulatory Visit | Attending: Orthopedic Surgery | Admitting: Orthopedic Surgery

## 2016-01-11 ENCOUNTER — Encounter (HOSPITAL_COMMUNITY)
Admission: RE | Disposition: A | Discharge status: Home / Self Care | Payer: Self-pay | Source: Ambulatory Visit | Attending: Orthopedic Surgery

## 2016-01-11 ENCOUNTER — Other Ambulatory Visit: Payer: Self-pay | Admitting: "Endocrinology

## 2016-01-11 DIAGNOSIS — Z5309 Procedure and treatment not carried out because of other contraindication: Secondary | ICD-10-CM | POA: Diagnosis not present

## 2016-01-11 DIAGNOSIS — S83289A Other tear of lateral meniscus, current injury, unspecified knee, initial encounter: Secondary | ICD-10-CM | POA: Diagnosis present

## 2016-01-11 DIAGNOSIS — Y939 Activity, unspecified: Secondary | ICD-10-CM | POA: Diagnosis not present

## 2016-01-11 DIAGNOSIS — IMO0002 Reserved for concepts with insufficient information to code with codable children: Secondary | ICD-10-CM

## 2016-01-11 DIAGNOSIS — S83249A Other tear of medial meniscus, current injury, unspecified knee, initial encounter: Secondary | ICD-10-CM | POA: Diagnosis not present

## 2016-01-11 DIAGNOSIS — E1169 Type 2 diabetes mellitus with other specified complication: Principal | ICD-10-CM

## 2016-01-11 DIAGNOSIS — X58XXXA Exposure to other specified factors, initial encounter: Secondary | ICD-10-CM | POA: Insufficient documentation

## 2016-01-11 DIAGNOSIS — E1165 Type 2 diabetes mellitus with hyperglycemia: Secondary | ICD-10-CM

## 2016-01-11 LAB — GLUCOSE, CAPILLARY: GLUCOSE-CAPILLARY: 345 mg/dL — AB (ref 65–99)

## 2016-01-11 SURGERY — CANCELLED PROCEDURE
Laterality: Right

## 2016-01-11 MED ORDER — LIDOCAINE HCL (PF) 1 % IJ SOLN
INTRAMUSCULAR | Status: AC
  Filled 2016-01-11: qty 5

## 2016-01-11 MED ORDER — FENTANYL CITRATE (PF) 250 MCG/5ML IJ SOLN
INTRAMUSCULAR | Status: AC
  Filled 2016-01-11: qty 5

## 2016-01-11 MED ORDER — PROPOFOL 10 MG/ML IV BOLUS
INTRAVENOUS | Status: AC
  Filled 2016-01-11: qty 20

## 2016-01-11 MED ORDER — SUCCINYLCHOLINE CHLORIDE 20 MG/ML IJ SOLN
INTRAMUSCULAR | Status: AC
  Filled 2016-01-11: qty 1

## 2016-01-11 MED ORDER — CHLORHEXIDINE GLUCONATE 4 % EX LIQD: 60.0000 mL | Freq: Once | CUTANEOUS | Status: DC

## 2016-01-11 MED ORDER — VANCOMYCIN HCL IN DEXTROSE 1-5 GM/200ML-% IV SOLN: 1000.0000 mg | INTRAVENOUS | Status: DC

## 2016-01-11 SURGICAL SUPPLY — 51 items
ARTHROWAND PARAGON T2 (SURGICAL WAND)
BAG HAMPER (MISCELLANEOUS) ×3 IMPLANT
BANDAGE ELASTIC 6 VELCRO NS (GAUZE/BANDAGES/DRESSINGS) ×3 IMPLANT
BLADE AGGRESSIVE PLUS 4.0 (BLADE) ×3 IMPLANT
BLADE SURG SZ11 CARB STEEL (BLADE) ×3 IMPLANT
CHLORAPREP W/TINT 26ML (MISCELLANEOUS) ×6 IMPLANT
CLOTH BEACON ORANGE TIMEOUT ST (SAFETY) ×3 IMPLANT
COOLER CRYO IC GRAV AND TUBE (ORTHOPEDIC SUPPLIES) ×3 IMPLANT
CUFF CRYO KNEE LG 20X31 COOLER (ORTHOPEDIC SUPPLIES) IMPLANT
CUFF CRYO KNEE18X23 MED (MISCELLANEOUS) IMPLANT
CUFF TOURNIQUET SINGLE 34IN LL (TOURNIQUET CUFF) IMPLANT
CUFF TOURNIQUET SINGLE 44IN (TOURNIQUET CUFF) IMPLANT
CUTTER ANGLED DBL BITE 4.5 (BURR) IMPLANT
DECANTER SPIKE VIAL GLASS SM (MISCELLANEOUS) ×6 IMPLANT
GAUZE SPONGE 4X4 12PLY STRL (GAUZE/BANDAGES/DRESSINGS) ×3 IMPLANT
GAUZE SPONGE 4X4 16PLY XRAY LF (GAUZE/BANDAGES/DRESSINGS) ×3 IMPLANT
GAUZE XEROFORM 5X9 LF (GAUZE/BANDAGES/DRESSINGS) ×3 IMPLANT
GLOVE BIOGEL PI IND STRL 7.0 (GLOVE) ×1 IMPLANT
GLOVE BIOGEL PI INDICATOR 7.0 (GLOVE) ×2
GLOVE SKINSENSE NS SZ8.0 LF (GLOVE) ×2
GLOVE SKINSENSE STRL SZ8.0 LF (GLOVE) ×1 IMPLANT
GLOVE SS N UNI LF 8.5 STRL (GLOVE) ×3 IMPLANT
GOWN STRL REUS W/ TWL LRG LVL3 (GOWN DISPOSABLE) ×1 IMPLANT
GOWN STRL REUS W/TWL LRG LVL3 (GOWN DISPOSABLE) ×2
GOWN STRL REUS W/TWL XL LVL3 (GOWN DISPOSABLE) ×3 IMPLANT
HLDR LEG FOAM (MISCELLANEOUS) ×1 IMPLANT
IV NS IRRIG 3000ML ARTHROMATIC (IV SOLUTION) ×6 IMPLANT
KIT BLADEGUARD II DBL (SET/KITS/TRAYS/PACK) ×3 IMPLANT
KIT ROOM TURNOVER AP CYSTO (KITS) ×3 IMPLANT
LEG HOLDER FOAM (MISCELLANEOUS) ×2
MANIFOLD NEPTUNE II (INSTRUMENTS) ×3 IMPLANT
MARKER SKIN DUAL TIP RULER LAB (MISCELLANEOUS) ×3 IMPLANT
NEEDLE HYPO 18GX1.5 BLUNT FILL (NEEDLE) ×3 IMPLANT
NEEDLE HYPO 21X1.5 SAFETY (NEEDLE) ×3 IMPLANT
NEEDLE SPNL 18GX3.5 QUINCKE PK (NEEDLE) ×3 IMPLANT
NS IRRIG 1000ML POUR BTL (IV SOLUTION) ×3 IMPLANT
PACK ARTHRO LIMB DRAPE STRL (MISCELLANEOUS) ×3 IMPLANT
PAD ABD 5X9 TENDERSORB (GAUZE/BANDAGES/DRESSINGS) ×3 IMPLANT
PAD ARMBOARD 7.5X6 YLW CONV (MISCELLANEOUS) ×3 IMPLANT
PADDING CAST COTTON 6X4 STRL (CAST SUPPLIES) ×3 IMPLANT
SET ARTHROSCOPY INST (INSTRUMENTS) ×3 IMPLANT
SET ARTHROSCOPY PUMP TUBE (IRRIGATION / IRRIGATOR) ×3 IMPLANT
SET BASIN LINEN APH (SET/KITS/TRAYS/PACK) ×3 IMPLANT
SUT ETHILON 3 0 FSL (SUTURE) IMPLANT
SYR 30ML LL (SYRINGE) ×3 IMPLANT
SYRINGE 10CC LL (SYRINGE) ×3 IMPLANT
TUBE CONNECTING 12'X1/4 (SUCTIONS) ×3
TUBE CONNECTING 12X1/4 (SUCTIONS) ×6 IMPLANT
WAND 50 DEG COVAC W/CORD (SURGICAL WAND) IMPLANT
WAND 90 DEG TURBOVAC W/CORD (SURGICAL WAND) IMPLANT
WAND ARTHRO PARAGON T2 (SURGICAL WAND) IMPLANT

## 2016-01-11 NOTE — Progress Notes (Signed)
Receptionist from Dr Liliane Channel office returned call. States that pt has not been to an appt in over a year. Pt refuses to pick up bloodsugar meter and is non-compliant. appt made this AM for work-up. Documentation of blood sugar readings and note given to pt to take for work-up. Pt discharge at 0830 to go to Dr Emi Holes office.

## 2016-01-11 NOTE — Progress Notes (Signed)
BMP Latest Ref Rng 01/05/2016  Glucose 65 - 99 mg/dL 365(H)  BUN 6 - 20 mg/dL 14  Creatinine 0.61 - 1.24 mg/dL 0.71  Sodium 135 - 145 mmol/L 130(L)  Potassium 3.5 - 5.1 mmol/L 4.7  Chloride 101 - 111 mmol/L 98(L)  CO2 22 - 32 mmol/L 25  Calcium 8.9 - 10.3 mg/dL 9.6    Elevated glucose  Not taking meds-cant get his script refilled  Surgery cancelled until glucose controlled

## 2016-01-11 NOTE — H&P (Signed)
Surgery cancel secondary to glucose of 350

## 2016-01-11 NOTE — Progress Notes (Signed)
Fingerstick 345 this AM. Dr Patsey Berthold aware. Surgery canceled. Dr Aline Brochure notified per Dr Patsey Berthold. Pt notified per Dr Patsey Berthold. Voiced understanding. Message left at Dr Dorris Fetch office about pt status and to return call.

## 2016-01-15 ENCOUNTER — Telehealth: Payer: Self-pay | Admitting: Orthopaedic Surgery

## 2016-01-15 MED ORDER — HYDROCODONE-ACETAMINOPHEN 5-325 MG PO TABS: 1.0000 | ORAL_TABLET | ORAL | Status: DC | PRN

## 2016-01-15 NOTE — Telephone Encounter (Signed)
Hydrocodone-Acetaminophen  5/325mg  Qty 120 Tablets °

## 2016-01-15 NOTE — Telephone Encounter (Signed)
Rx done. 

## 2016-01-16 ENCOUNTER — Ambulatory Visit: Payer: Medicaid Other | Admitting: Orthopaedic Surgery

## 2016-01-16 ENCOUNTER — Other Ambulatory Visit: Payer: Self-pay | Admitting: "Endocrinology

## 2016-01-17 LAB — BASIC METABOLIC PANEL
BUN / CREAT RATIO: 13 (ref 9–20)
BUN: 10 mg/dL (ref 6–24)
CO2: 23 mmol/L (ref 18–29)
CREATININE: 0.8 mg/dL (ref 0.76–1.27)
Calcium: 9.2 mg/dL (ref 8.7–10.2)
Chloride: 98 mmol/L (ref 96–106)
GFR calc Af Amer: 128 mL/min/{1.73_m2} (ref >59)
GFR, EST NON AFRICAN AMERICAN: 111 mL/min/{1.73_m2} (ref >59)
GLUCOSE: 195 mg/dL — AB (ref 65–99)
Potassium: 4.4 mmol/L (ref 3.5–5.2)
SODIUM: 139 mmol/L (ref 134–144)

## 2016-01-17 LAB — HGB A1C W/O EAG: Hgb A1c MFr Bld: 11.6 % — ABNORMAL HIGH (ref 4.8–5.6)

## 2016-01-19 ENCOUNTER — Other Ambulatory Visit: Payer: Self-pay | Admitting: Orthopaedic Surgery

## 2016-01-19 ENCOUNTER — Encounter: Payer: Self-pay | Admitting: *Deleted

## 2016-01-25 ENCOUNTER — Ambulatory Visit (INDEPENDENT_AMBULATORY_CARE_PROVIDER_SITE_OTHER): Payer: Self-pay | Admitting: "Endocrinology

## 2016-01-25 ENCOUNTER — Encounter: Payer: Self-pay | Admitting: "Endocrinology

## 2016-01-25 VITALS — BP 116/76 | HR 100 | Resp 18 | Ht 71.0 in | Wt 222.0 lb

## 2016-01-25 DIAGNOSIS — Z91199 Patient's noncompliance with other medical treatment and regimen due to unspecified reason: Secondary | ICD-10-CM | POA: Insufficient documentation

## 2016-01-25 DIAGNOSIS — I10 Essential (primary) hypertension: Secondary | ICD-10-CM

## 2016-01-25 DIAGNOSIS — E118 Type 2 diabetes mellitus with unspecified complications: Secondary | ICD-10-CM

## 2016-01-25 DIAGNOSIS — Z794 Long term (current) use of insulin: Secondary | ICD-10-CM

## 2016-01-25 DIAGNOSIS — E1165 Type 2 diabetes mellitus with hyperglycemia: Secondary | ICD-10-CM

## 2016-01-25 DIAGNOSIS — Z9119 Patient's noncompliance with other medical treatment and regimen: Secondary | ICD-10-CM

## 2016-01-25 DIAGNOSIS — IMO0002 Reserved for concepts with insufficient information to code with codable children: Secondary | ICD-10-CM

## 2016-01-25 LAB — GLUCOSE, POCT (MANUAL RESULT ENTRY): POC GLUCOSE: 347 mg/dL — AB (ref 70–99)

## 2016-01-25 MED ORDER — INSULIN GLARGINE 100 UNIT/ML SOLOSTAR PEN: 50.0000 [IU] | PEN_INJECTOR | Freq: Every day | SUBCUTANEOUS | Status: DC

## 2016-01-25 MED ORDER — GLUCOSE BLOOD VI STRP: ORAL_STRIP | Status: DC

## 2016-01-25 MED ORDER — METFORMIN HCL 500 MG PO TABS: 500.0000 mg | ORAL_TABLET | Freq: Two times a day (BID) | ORAL | Status: DC

## 2016-01-25 NOTE — Progress Notes (Signed)
Subjective:    Patient ID: Matthew Dougherty, male    DOB: 12/14/1975, PCP Glenda Chroman, MD   Past Medical History  Diagnosis Date  . COPD (chronic obstructive pulmonary disease) ([place])   . Diabetes mellitus without complication ([place])   . Arthritis   . Asthma   . Neuropathy ([place])   . Hypertension   . Depression   . Chronic kidney disease     partial removal of left kidney; cancer  . Cancer ([place])     kidney  . Seizures ([place])     few years ago; states that he aspirated and had a seizure; no meds for seizures and none since then  . Restless leg syndrome    Past Surgical History  Procedure Laterality Date  . Pinched nerves in back    . Partial nephrectomy Left   . Septoplasty    . Knee surgery Left     as child; removal of straight pin from under knee cap.   Social History   Social History  . Marital Status: Unknown    Spouse Name: N/A  . Number of Children: N/A  . Years of Education: N/A   Social History Main Topics  . Smoking status: Current Every Day Smoker -- 1.50 packs/day for 31 years    Types: Cigarettes  . Smokeless tobacco: None  . Alcohol Use: Yes     Comment: occasional  . Drug Use: No  . Sexual Activity: Yes   Other Topics Concern  . None   Social History Narrative   Outpatient Encounter Prescriptions as of 01/25/2016  Medication Sig  . diclofenac sodium (VOLTAREN) 1 % GEL Apply 4 g topically 4 (four) times daily. (Patient not taking: Reported on 01/25/2016)  . gabapentin (NEURONTIN) 300 MG capsule Take 300 mg by mouth 2 (two) times daily. Reported on 01/25/2016  . glucose blood (ACCU-CHEK AVIVA) test strip To test glucose 4 times a day  . HYDROcodone-acetaminophen (NORCO/VICODIN) 5-325 MG tablet Take 1 tablet by mouth every 4 (four) hours as needed for moderate pain (Must last 30 days.  Do not take and drive a car or use machinery.). (Patient not taking: Reported on 01/25/2016)  . Insulin Glargine (LANTUS SOLOSTAR) 100 UNIT/ML Solostar Pen Inject 50  Units into the skin daily at 10 pm.  . INVOKANA 100 MG TABS tablet TAKE (1) TABLET BY MOUTH ONCE DAILY. (Patient not taking: Reported on 01/25/2016)  . lisinopril (PRINIVIL,ZESTRIL) 10 MG tablet Take 10 mg by mouth daily. Reported on 01/25/2016  . metFORMIN (GLUCOPHAGE) 500 MG tablet Take 1 tablet (500 mg total) by mouth 2 (two) times daily with a meal.  . NOVOLOG FLEXPEN 100 UNIT/ML FlexPen INJECT 10 UNITS SUBCUTANEOUSLY 3 TIMES DAILY BEFORE MEALS. (Patient not taking: Reported on 01/25/2016)   No facility-administered encounter medications on file as of 01/25/2016.   ALLERGIES: Allergies  Allergen Reactions  . Penicillins    VACCINATION STATUS:  There is no immunization history on file for this patient.  Diabetes He presents for his follow-up diabetic visit. He has type 2 diabetes mellitus. Onset time: He was diagnosed at approximate age of 53 years. His disease course has been worsening. There are no hypoglycemic associated symptoms. Pertinent negatives for hypoglycemia include no confusion, headaches, pallor or seizures. Associated symptoms include polydipsia, polyphagia, polyuria and visual change. Pertinent negatives for diabetes include no chest pain, no fatigue and no weakness. There are no hypoglycemic complications. Symptoms are worsening. Diabetic complications include peripheral neuropathy. Risk factors  for coronary artery disease include diabetes mellitus, dyslipidemia, family history, hypertension, male sex, obesity, sedentary lifestyle and tobacco exposure. Current diabetic treatment includes insulin injections and oral agent (monotherapy) (He did not keep up with his appointments, ran out of most of his medications. His most recent A1c has increased to 11.6%.). He is compliant with treatment none of the time. His weight is increasing steadily. He is following a generally unhealthy diet. When asked about meal planning, he reported none. He has not had a previous visit with a dietitian  (He missed his appointments with the dietitian as well.). He never participates in exercise. He monitors blood glucose at home 1-2 x per day. There is no compliance with monitoring of blood glucose. His home blood glucose trend is increasing steadily. His breakfast blood glucose range is generally >200 mg/dl. His lunch blood glucose range is generally >200 mg/dl. His dinner blood glucose range is generally >200 mg/dl. His overall blood glucose range is >200 mg/dl. An ACE inhibitor/angiotensin II receptor blocker is being taken. Eye exam is current.  Hyperlipidemia This is a chronic problem. The current episode started more than 1 year ago. Pertinent negatives include no chest pain, myalgias or shortness of breath. Risk factors for coronary artery disease include family history, dyslipidemia, diabetes mellitus, hypertension, male sex, obesity and a sedentary lifestyle.  Hypertension This is a chronic problem. The current episode started more than 1 year ago. Pertinent negatives include no chest pain, headaches, neck pain, palpitations or shortness of breath. Risk factors for coronary artery disease include diabetes mellitus, dyslipidemia, male gender, smoking/tobacco exposure, sedentary lifestyle and obesity. Past treatments include ACE inhibitors.     Review of Systems  Constitutional: Negative for fever, chills, fatigue and unexpected weight change.  HENT: Negative for dental problem, mouth sores and trouble swallowing.   Eyes: Negative for visual disturbance.  Respiratory: Negative for cough, choking, chest tightness, shortness of breath and wheezing.   Cardiovascular: Negative for chest pain, palpitations and leg swelling.  Gastrointestinal: Negative for nausea, vomiting, abdominal pain, diarrhea, constipation and abdominal distention.  Endocrine: Positive for polydipsia, polyphagia and polyuria.  Genitourinary: Negative for dysuria, urgency, hematuria and flank pain.  Musculoskeletal: Negative  for myalgias, back pain, gait problem and neck pain.  Skin: Negative for pallor, rash and wound.  Neurological: Negative for seizures, syncope, weakness, numbness and headaches.  Psychiatric/Behavioral: Negative.  Negative for confusion and dysphoric mood.    Objective:    BP 116/76 mmHg  Pulse 100  Resp 18  Ht 5\' 11"  (1.803 m)  Wt 222 lb (100.699 kg)  BMI 30.98 kg/m2  SpO2 92%  Wt Readings from Last 3 Encounters:  01/25/16 222 lb (100.699 kg)  01/05/16 218 lb (98.884 kg)  12/25/15 217 lb 9.6 oz (98.703 kg)    Physical Exam  Constitutional: He is oriented to person, place, and time. He appears well-developed. He is cooperative. No distress.  Obese, disheveled. Has poor hygiene.  HENT:  Head: Normocephalic and atraumatic.  He has poor oral hygiene.   Eyes: EOM are normal.  Neck: Normal range of motion. Neck supple. No tracheal deviation present. No thyromegaly present.  Cardiovascular: Normal rate, S1 normal, S2 normal and normal heart sounds.  Exam reveals no gallop.   No murmur heard. Pulses:      Dorsalis pedis pulses are 1+ on the right side, and 1+ on the left side.       Posterior tibial pulses are 1+ on the right side, and 1+ on the  left side.  Pulmonary/Chest: Breath sounds normal. No respiratory distress. He has no wheezes.  Abdominal: Soft. Bowel sounds are normal. He exhibits no distension. There is no tenderness. There is no guarding and no CVA tenderness.  Musculoskeletal: He exhibits no edema.       Right shoulder: He exhibits no swelling and no deformity.  Neurological: He is alert and oriented to person, place, and time. He has normal strength and normal reflexes. No cranial nerve deficit or sensory deficit. Gait normal.  Skin: Skin is warm and dry. No rash noted. No cyanosis. Nails show no clubbing.  Psychiatric: His speech is normal. Cognition and memory are normal.  Noncompliant/reluctant affect.     CMP ( most recent) CMP     Component Value  Date/Time   NA 139 01/16/2016 0924   NA 130* 01/05/2016 1110   K 4.4 01/16/2016 0924   CL 98 01/16/2016 0924   CO2 23 01/16/2016 0924   GLUCOSE 195* 01/16/2016 0924   GLUCOSE 365* 01/05/2016 1110   BUN 10 01/16/2016 0924   BUN 14 01/05/2016 1110   CREATININE 0.80 01/16/2016 0924   CALCIUM 9.2 01/16/2016 0924   GFRNONAA 111 01/16/2016 0924   GFRAA 128 01/16/2016 0924    Point-of-care glucose this morning was 347 mg/dL.   Assessment & Plan:   1. Uncontrolled type 2 diabetes mellitus with complication, with long-term current use of insulin ([place])  His diabetes is  complicated by noncompliance/nonadherence, peripheral neuropathy, obesity, and patient remains at a high risk for more acute and chronic complications of diabetes which include CAD, CVA, CKD, retinopathy, and neuropathy. These are all discussed in detail with the patient.  Patient came with inadequate glucose profile, and  recent A1c of 11.6 %.  Glucose logs and insulin administration records pertaining to this visit,  to be scanned into patient's records.  Recent labs reviewed.   - I have re-counseled the patient on diet management and weight loss  by adopting a carbohydrate restricted / protein rich  Diet.  - Suggestion is made for patient to avoid simple carbohydrates   from their diet including Cakes , Desserts, Ice Cream,  Soda (  diet and regular) , Sweet Tea , Candies,  Chips, Cookies, Artificial Sweeteners,   and "Sugar-free" Products .  This will help patient to have stable blood glucose profile and potentially avoid unintended  Weight gain.  - Patient is advised to stick to a routine mealtimes to eat 3 meals  a day and avoid unnecessary snacks ( to snack only to correct hypoglycemia).  - The patient  has been  scheduled with Jearld Fenton, RDN, CDE for individualized DM education.  - I have approached patient with the following individualized plan to manage diabetes and patient agrees.  -  Patient remains  noncompliant, canceled his visits from July 2016. - I will resume his basal insulin Lantus at 50 units QHS,  associated with strict monitoring of glucose  AC and HS. - He will likely require prandial insulin based on his commitment and blood glucose readings.  -Adjustment parameters are given for hypo and hyperglycemia in writing. -Patient is encouraged to call clinic for blood glucose levels less than 70 or above 300 mg /dl. - I will continue metformin 500 mg by mouth twice a day, therapeutically suitable for patient. -I will discontinue his Invokana. -  He has history of left partial nephrectomy for reported renal cell carcinoma hence he only has right kidney.  - Patient specific target  for A1c; LDL, HDL, Triglycerides, and  Waist Circumference were discussed in detail.  2) BP/HTN: Controlled. Continue current medications including ACEI/ARB. 3) Lipids/HPL: Control unknown, he is not on statins. I will obtain fasting lipid panel on subsequent visit. 4)  Weight/Diet: CDE consult in progress, exercise, and carbohydrates information provided.  5) Chronic Care/Health Maintenance:  -Patient is on ACEI/ARB and encouraged to continue to follow up with Ophthalmology, Podiatrist at least yearly or according to recommendations, and advised to quit smoking. I have recommended yearly flu vaccine and pneumonia vaccination at least every 5 years; moderate intensity exercise for up to 150 minutes weekly; and  sleep for at least 7 hours a day.  - 25 minutes of time was spent on the care of this patient , 50% of which was applied for counseling on diabetes complications and their preventions.  - I advised patient to maintain close follow up with Glenda Chroman, MD for primary care needs.  Patient is asked to bring meter and  blood glucose logs during their next visit.   Follow up plan: -Return in about 2 weeks (around 02/08/2016) for diabetes, high blood pressure, follow up with meter and logs- no  labs.  Glade Lloyd, MD Phone: [phone]  Fax: [phone]   01/25/2016, 5:30 PM

## 2016-01-26 ENCOUNTER — Other Ambulatory Visit: Payer: Self-pay | Admitting: "Endocrinology

## 2016-01-26 ENCOUNTER — Telehealth: Payer: Self-pay

## 2016-01-26 ENCOUNTER — Other Ambulatory Visit: Payer: Self-pay

## 2016-01-26 NOTE — Telephone Encounter (Signed)
Patient aware.

## 2016-01-26 NOTE — Telephone Encounter (Signed)
Please advise him to increase lantus to 60 units qhs, continue to monitor BG AC and HS , call back if >300x3, and return on appointment.

## 2016-01-29 NOTE — Telephone Encounter (Signed)
Patient aware of advice.  Will increase Lantus and call back with readings over 300 x 3

## 2016-02-08 ENCOUNTER — Ambulatory Visit (INDEPENDENT_AMBULATORY_CARE_PROVIDER_SITE_OTHER): Payer: Medicaid Other | Admitting: "Endocrinology

## 2016-02-08 ENCOUNTER — Encounter: Payer: Self-pay | Admitting: "Endocrinology

## 2016-02-08 VITALS — BP 119/79 | HR 92 | Wt 229.4 lb

## 2016-02-08 DIAGNOSIS — Z91199 Patient's noncompliance with other medical treatment and regimen due to unspecified reason: Secondary | ICD-10-CM

## 2016-02-08 DIAGNOSIS — E119 Type 2 diabetes mellitus without complications: Secondary | ICD-10-CM

## 2016-02-08 DIAGNOSIS — I10 Essential (primary) hypertension: Secondary | ICD-10-CM | POA: Diagnosis not present

## 2016-02-08 DIAGNOSIS — Z9119 Patient's noncompliance with other medical treatment and regimen: Secondary | ICD-10-CM | POA: Diagnosis not present

## 2016-02-08 MED ORDER — ACCU-CHEK AVIVA DEVI: 0 refills | Status: DC

## 2016-02-08 MED ORDER — INSULIN ASPART 100 UNIT/ML FLEXPEN: 10.0000 [IU] | PEN_INJECTOR | Freq: Three times a day (TID) | SUBCUTANEOUS | 2 refills | Status: DC

## 2016-02-08 NOTE — Progress Notes (Signed)
Subjective:    Patient ID: Matthew Dougherty, male    DOB: 1976/01/07, PCP Glenda Chroman, MD   Past Medical History:  Diagnosis Date  . Arthritis   . Asthma   . Cancer ([place])    kidney  . Chronic kidney disease    partial removal of left kidney; cancer  . COPD (chronic obstructive pulmonary disease) ([place])   . Depression   . Diabetes mellitus without complication ([place])   . Hypertension   . Neuropathy ([place])   . Restless leg syndrome   . Seizures ([place])    few years ago; states that he aspirated and had a seizure; no meds for seizures and none since then   Past Surgical History:  Procedure Laterality Date  . KNEE SURGERY Left    as child; removal of straight pin from under knee cap.  Marland Kitchen PARTIAL NEPHRECTOMY Left   . pinched nerves in back    . SEPTOPLASTY     Social History   Social History  . Marital status: Unknown    Spouse name: N/A  . Number of children: N/A  . Years of education: N/A   Social History Main Topics  . Smoking status: Current Every Day Smoker    Packs/day: 1.50    Years: 31.00    Types: Cigarettes  . Smokeless tobacco: None  . Alcohol use Yes     Comment: occasional  . Drug use: No  . Sexual activity: Yes   Other Topics Concern  . None   Social History Narrative  . None   Outpatient Encounter Prescriptions as of 02/08/2016  Medication Sig  . diclofenac sodium (VOLTAREN) 1 % GEL Apply 4 g topically 4 (four) times daily.  Marland Kitchen EASY TOUCH PEN NEEDLES 31G X 8 MM MISC DEVICE TO INJECT INSULIN 4 TIMES DAILY.  Marland Kitchen gabapentin (NEURONTIN) 300 MG capsule Take 300 mg by mouth 2 (two) times daily. Reported on 01/25/2016  . glucose blood (ACCU-CHEK AVIVA) test strip To test glucose 4 times a day  . HYDROcodone-acetaminophen (NORCO/VICODIN) 5-325 MG tablet Take 1 tablet by mouth every 4 (four) hours as needed for moderate pain (Must last 30 days.  Do not take and drive a car or use machinery.).  . Insulin Glargine (LANTUS SOLOSTAR [place]) Inject 60 Units into the  skin at bedtime.  Marland Kitchen lisinopril (PRINIVIL,ZESTRIL) 10 MG tablet Take 10 mg by mouth daily. Reported on 01/25/2016  . metFORMIN (GLUCOPHAGE) 500 MG tablet Take 1 tablet (500 mg total) by mouth 2 (two) times daily with a meal.  . [DISCONTINUED] Insulin Glargine (LANTUS SOLOSTAR) 100 UNIT/ML Solostar Pen Inject 50 Units into the skin daily at 10 pm.  . Blood Glucose Monitoring Suppl (ACCU-CHEK AVIVA) device Use as instructed  . insulin aspart (NOVOLOG) 100 UNIT/ML FlexPen Inject 10 Units into the skin 3 (three) times daily with meals.  . [DISCONTINUED] Blood Glucose Monitoring Suppl (ACCU-CHEK AVIVA) device Use as instructed   No facility-administered encounter medications on file as of 02/08/2016.    ALLERGIES: Allergies  Allergen Reactions  . Penicillins    VACCINATION STATUS:  There is no immunization history on file for this patient.  Diabetes  He presents for his follow-up diabetic visit. He has type 2 diabetes mellitus. Onset time: He was diagnosed at approximate age of 49 years. His disease course has been worsening. There are no hypoglycemic associated symptoms. Pertinent negatives for hypoglycemia include no confusion, headaches, pallor or seizures. Associated symptoms include polydipsia, polyphagia, polyuria and  visual change. Pertinent negatives for diabetes include no chest pain, no fatigue and no weakness. There are no hypoglycemic complications. Symptoms are worsening. Diabetic complications include peripheral neuropathy. Risk factors for coronary artery disease include diabetes mellitus, dyslipidemia, family history, hypertension, male sex, obesity, sedentary lifestyle and tobacco exposure. Current diabetic treatment includes insulin injections and oral agent (monotherapy) (He did not keep up with his appointments, ran out of most of his medications. His most recent A1c has increased to 11.6%.). He is compliant with treatment none of the time. His weight is increasing steadily. He is  following a generally unhealthy diet. When asked about meal planning, he reported none. He has not had a previous visit with a dietitian (He missed his appointments with the dietitian as well.). He never participates in exercise. He monitors blood glucose at home 1-2 x per day. There is no compliance with monitoring of blood glucose. His home blood glucose trend is increasing steadily. His breakfast blood glucose range is generally >200 mg/dl. His lunch blood glucose range is generally >200 mg/dl. His dinner blood glucose range is generally >200 mg/dl. His overall blood glucose range is >200 mg/dl. An ACE inhibitor/angiotensin II receptor blocker is being taken. Eye exam is current.  Hyperlipidemia  This is a chronic problem. The current episode started more than 1 year ago. Pertinent negatives include no chest pain, myalgias or shortness of breath. Risk factors for coronary artery disease include family history, dyslipidemia, diabetes mellitus, hypertension, male sex, obesity and a sedentary lifestyle.  Hypertension  This is a chronic problem. The current episode started more than 1 year ago. Pertinent negatives include no chest pain, headaches, neck pain, palpitations or shortness of breath. Risk factors for coronary artery disease include diabetes mellitus, dyslipidemia, male gender, smoking/tobacco exposure, sedentary lifestyle and obesity. Past treatments include ACE inhibitors.     Review of Systems  Constitutional: Negative for chills, fatigue, fever and unexpected weight change.  HENT: Negative for dental problem, mouth sores and trouble swallowing.   Eyes: Negative for visual disturbance.  Respiratory: Negative for cough, choking, chest tightness, shortness of breath and wheezing.   Cardiovascular: Negative for chest pain, palpitations and leg swelling.  Gastrointestinal: Negative for abdominal distention, abdominal pain, constipation, diarrhea, nausea and vomiting.  Endocrine: Positive for  polydipsia, polyphagia and polyuria.  Genitourinary: Negative for dysuria, flank pain, hematuria and urgency.  Musculoskeletal: Negative for back pain, gait problem, myalgias and neck pain.  Skin: Negative for pallor, rash and wound.  Neurological: Negative for seizures, syncope, weakness, numbness and headaches.  Psychiatric/Behavioral: Negative.  Negative for confusion and dysphoric mood.    Objective:    BP 119/79   Pulse 92   Wt 229 lb 6 oz (104 kg)   BMI 31.99 kg/m   Wt Readings from Last 3 Encounters:  02/08/16 229 lb 6 oz (104 kg)  01/25/16 222 lb (100.7 kg)  01/05/16 218 lb (98.9 kg)    Physical Exam  Constitutional: He is oriented to person, place, and time. He appears well-developed. He is cooperative. No distress.  Obese, disheveled. Has poor hygiene.  HENT:  Head: Normocephalic and atraumatic.  He has poor oral hygiene.   Eyes: EOM are normal.  Neck: Normal range of motion. Neck supple. No tracheal deviation present. No thyromegaly present.  Cardiovascular: Normal rate, S1 normal, S2 normal and normal heart sounds.  Exam reveals no gallop.   No murmur heard. Pulses:      Dorsalis pedis pulses are 1+ on the right side,  and 1+ on the left side.       Posterior tibial pulses are 1+ on the right side, and 1+ on the left side.  Pulmonary/Chest: Breath sounds normal. No respiratory distress. He has no wheezes.  Abdominal: Soft. Bowel sounds are normal. He exhibits no distension. There is no tenderness. There is no guarding and no CVA tenderness.  Musculoskeletal: He exhibits no edema.       Right shoulder: He exhibits no swelling and no deformity.  Neurological: He is alert and oriented to person, place, and time. He has normal strength and normal reflexes. No cranial nerve deficit or sensory deficit. Gait normal.  Skin: Skin is warm and dry. No rash noted. No cyanosis. Nails show no clubbing.  Psychiatric: His speech is normal. Cognition and memory are normal.   Noncompliant/reluctant affect.     CMP ( most recent) CMP     Component Value Date/Time   NA 139 01/16/2016 0924   K 4.4 01/16/2016 0924   CL 98 01/16/2016 0924   CO2 23 01/16/2016 0924   GLUCOSE 195 (H) 01/16/2016 0924   GLUCOSE 365 (H) 01/05/2016 1110   BUN 10 01/16/2016 0924   CREATININE 0.80 01/16/2016 0924   CALCIUM 9.2 01/16/2016 0924   GFRNONAA 111 01/16/2016 0924   GFRAA 128 01/16/2016 0924    Point-of-care glucose this morning was 347 mg/dL.   Assessment & Plan:   1. Uncontrolled type 2 diabetes mellitus with complication, with long-term current use of insulin ([place])  His diabetes is  complicated by noncompliance/nonadherence, peripheral neuropathy, obesity, and patient remains at a high risk for more acute and chronic complications of diabetes which include CAD, CVA, CKD, retinopathy, and neuropathy. These are all discussed in detail with the patient.  Patient came with inadequate glucose profile, and  recent A1c of 11.6 %.  Glucose logs and insulin administration records pertaining to this visit,  to be scanned into patient's records.  Recent labs reviewed.   - I have re-counseled the patient on diet management and weight loss  by adopting a carbohydrate restricted / protein rich  Diet.  - Suggestion is made for patient to avoid simple carbohydrates   from their diet including Cakes , Desserts, Ice Cream,  Soda (  diet and regular) , Sweet Tea , Candies,  Chips, Cookies, Artificial Sweeteners,   and "Sugar-free" Products .  This will help patient to have stable blood glucose profile and potentially avoid unintended  Weight gain.  - Patient is advised to stick to a routine mealtimes to eat 3 meals  a day and avoid unnecessary snacks ( to snack only to correct hypoglycemia).  - The patient  has been  scheduled with Jearld Fenton, RDN, CDE for individualized DM education.  - I have approached patient with the following individualized plan to manage diabetes and  patient agrees.  -  Patient remains  Alarmingly noncompliant, as far as his meal and insulin timing. - He came with a log showing 1-2 readings despite my advice for him to monitor blood glucose before meals and at bedtime. - I approached him for better engagement because he needs basal/bolus insulin. - I will increase his basal insulin Lantus at 50 units QHS,  initiate NovoLog 10 units 3 times a day before meals for pre-meal blood glucose between 90 and 150 minute grams per deciliter plus correction for above 150 mg/dL, associated with strict monitoring of glucose  AC and HS.  -Adjustment parameters are given for hypo and  hyperglycemia in writing. -Patient is encouraged to call clinic for blood glucose levels less than 70 or above 300 mg /dl. - I will continue metformin 500 mg by mouth twice a day, therapeutically suitable for patient. -I will discontinue his Invokana. -  He has history of left partial nephrectomy for reported renal cell carcinoma hence he only has right kidney.  - Patient specific target  for A1c; LDL, HDL, Triglycerides, and  Waist Circumference were discussed in detail.  2) BP/HTN: Controlled. Continue current medications including ACEI/ARB. 3) Lipids/HPL: Control unknown, he is not on statins. I will obtain fasting lipid panel on subsequent visit. 4)  Weight/Diet: CDE consult in progress, exercise, and carbohydrates information provided.  5) Chronic Care/Health Maintenance:  -Patient is on ACEI/ARB and encouraged to continue to follow up with Ophthalmology, Podiatrist at least yearly or according to recommendations, and advised to quit smoking. I have recommended yearly flu vaccine and pneumonia vaccination at least every 5 years; moderate intensity exercise for up to 150 minutes weekly; and  sleep for at least 7 hours a day.  - 25 minutes of time was spent on the care of this patient , 50% of which was applied for counseling on diabetes complications and their  preventions.  - I advised patient to maintain close follow up with Glenda Chroman, MD for primary care needs.  Patient is asked to bring meter and  blood glucose logs during their next visit.   Follow up plan: -Return in about 3 weeks (around 02/29/2016) for follow up with meter and logs- no labs.  Glade Lloyd, MD Phone: [phone]  Fax: [phone]   02/08/2016, 9:52 AM

## 2016-02-08 NOTE — Patient Instructions (Signed)

## 2016-02-14 ENCOUNTER — Telehealth: Payer: Self-pay | Admitting: Orthopaedic Surgery

## 2016-02-14 ENCOUNTER — Telehealth: Payer: Self-pay | Admitting: Orthopedic Surgery

## 2016-02-14 NOTE — Telephone Encounter (Signed)
He was scheduled for surgery in July but surgery cancelled secondary to elevated blood sugars.  Since he was sent to Dr. Aline Brochure for possible surgery, send Rx request to him now.  Thanks.

## 2016-02-14 NOTE — Telephone Encounter (Signed)
Patient called and requested a refill on Hydrocodone-Acetaminophen 5-325 mgs  Qty  120  Sig: Take 1 tablet by mouth every 4 (four) hours as needed for moderate pain (Must last 30 days.  Do not take and drive a car or use machinery.). °

## 2016-02-15 ENCOUNTER — Encounter: Payer: Self-pay | Admitting: *Deleted

## 2016-02-16 ENCOUNTER — Ambulatory Visit (INDEPENDENT_AMBULATORY_CARE_PROVIDER_SITE_OTHER): Payer: Medicaid Other | Admitting: Internal Medicine

## 2016-02-16 ENCOUNTER — Other Ambulatory Visit: Payer: Medicaid Other

## 2016-02-16 ENCOUNTER — Ambulatory Visit (INDEPENDENT_AMBULATORY_CARE_PROVIDER_SITE_OTHER)
Admission: RE | Admit: 2016-02-16 | Discharge: 2016-02-16 | Disposition: A | Discharge status: Home / Self Care | Payer: Medicaid Other | Source: Ambulatory Visit | Attending: Internal Medicine | Admitting: Internal Medicine

## 2016-02-16 ENCOUNTER — Encounter (INDEPENDENT_AMBULATORY_CARE_PROVIDER_SITE_OTHER): Payer: Self-pay

## 2016-02-16 ENCOUNTER — Telehealth: Payer: Self-pay | Admitting: Internal Medicine

## 2016-02-16 ENCOUNTER — Encounter: Payer: Self-pay | Admitting: Internal Medicine

## 2016-02-16 VITALS — BP 100/60 | HR 103 | Ht 71.0 in | Wt 226.0 lb

## 2016-02-16 DIAGNOSIS — I10 Essential (primary) hypertension: Secondary | ICD-10-CM

## 2016-02-16 DIAGNOSIS — J449 Chronic obstructive pulmonary disease, unspecified: Secondary | ICD-10-CM

## 2016-02-16 DIAGNOSIS — Z720 Tobacco use: Secondary | ICD-10-CM

## 2016-02-16 DIAGNOSIS — F17210 Nicotine dependence, cigarettes, uncomplicated: Secondary | ICD-10-CM

## 2016-02-16 MED ORDER — PREDNISONE 10 MG PO TABS: ORAL_TABLET | ORAL | 0 refills | Status: DC

## 2016-02-16 MED ORDER — FLUTICASONE-SALMETEROL 115-21 MCG/ACT IN AERO: 2.0000 | INHALATION_SPRAY | Freq: Two times a day (BID) | RESPIRATORY_TRACT | Status: DC

## 2016-02-16 MED ORDER — TIOTROPIUM BROMIDE MONOHYDRATE 2.5 MCG/ACT IN AERS: INHALATION_SPRAY | RESPIRATORY_TRACT | 11 refills | Status: DC

## 2016-02-16 MED ORDER — VALSARTAN 80 MG PO TABS: 80.0000 mg | ORAL_TABLET | Freq: Every day | ORAL | 11 refills | Status: DC

## 2016-02-16 MED ORDER — TIOTROPIUM BROMIDE MONOHYDRATE 2.5 MCG/ACT IN AERS: INHALATION_SPRAY | RESPIRATORY_TRACT | 0 refills | Status: DC

## 2016-02-16 NOTE — Telephone Encounter (Signed)
Called and spoke with pt and he is aware of cxr results per MW.  Nothing further is needed.  

## 2016-02-16 NOTE — Progress Notes (Signed)
Subjective:    Patient ID: Matthew Dougherty, male    DOB: [birth year],    MRN: [id number]  HPI  40 yowm active smoker  from [place] good ex tol as teenager though had dx of asthma starting around age 42-14 and able to play to football/ cross country and had inhaler but didn't use it much until around 2010 need for saba increased and around 2014 placed on maint rx with advair but not better with criteria for severe copd in 04/2014 referred to pulmonary clinic 02/16/2016 by Dr Woody Seller   02/16/2016 1st [facility] office visit/ Matthew Dougherty  maint rx advair hfa  spiriva dpi / acei Chief Complaint  Patient presents with  . Pulmonary Consult    Referred by Dr. Woody Seller. Pt states dxed with COPD in 2015. He c/o increased cough and SOB over the past year. He states that some days he gets winded just walking around his house. His cough is prod with clear sputum and tends to be worse first thing in the am and then at night. He states that his symptoms can flare when exposed to certain smells and also with weather change.   placed on acei x 10 y and cough worse x around 2 years prior to OV  - no classic pleuritic or ex cp Doe = MMRC4  = sob if tries to leave home or while getting dressed   Some better with saba  No obvious day to day or daytime variability or assoc excess/ purulent sputum or mucus plugs or hemoptysis or  chest tightness, subjective wheeze or overt sinus or hb symptoms. No unusual exp hx or h/o childhood pna/ asthma or knowledge of premature birth.  Sleeping ok without nocturnal  or early am exacerbation  of respiratory  c/o's or need for noct saba. Also denies any obvious fluctuation of symptoms with weather or environmental changes or other aggravating or alleviating factors except as outlined above   Current Medications, Allergies, Complete Past Medical History, Past Surgical History, Family History, and Social History were reviewed in [employer] record.        Review of Systems  Constitutional: Positive for appetite change. Negative for activity change, chills, fever and unexpected weight change.  HENT: Positive for congestion. Negative for dental problem, postnasal drip, rhinorrhea, sneezing, sore throat, trouble swallowing and voice change.   Eyes: Negative for visual disturbance.  Respiratory: Positive for cough and shortness of breath. Negative for choking.   Cardiovascular: Positive for chest pain. Negative for leg swelling.  Gastrointestinal: Negative for abdominal pain, nausea and vomiting.  Genitourinary: Negative for difficulty urinating.  Musculoskeletal: Positive for arthralgias.  Skin: Negative for rash.  Psychiatric/Behavioral: Negative for behavioral problems and confusion.       Objective:   Physical Exam  Wt Readings from Last 3 Encounters:  02/16/16 226 lb (102.5 kg)  02/08/16 229 lb 6 oz (104 kg)  01/25/16 222 lb (100.7 kg)    Vital signs reviewed   HEENT: nl dentition, turbinates, and oropharynx. Nl external ear canals without cough reflex   NECK :  without JVD/Nodes/TM/ nl carotid upstrokes bilaterally   LUNGS: no acc muscle use,  Nl contour chest which is clear to A and P bilaterally without cough on insp or exp maneuvers   CV:  RRR  no s3 or murmur or increase in P2, no edema   ABD:  soft and nontender with nl inspiratory excursion in the supine position. No bruits or organomegaly, bowel sounds  nl  MS:  Nl gait/ ext warm without deformities, calf tenderness, cyanosis or clubbing No obvious joint restrictions   SKIN: warm and dry without lesions    NEURO:  alert, approp, nl sensorium with  no motor deficits    CXR PA and Lateral:   02/16/2016 :    I personally reviewed images and agree with radiology impression as follows:   Mild hyperinflation. No infiltrate or pulmonary edema. Minimal perihilar bronchitic changes.              Assessment & Plan:

## 2016-02-16 NOTE — Progress Notes (Signed)
lmtcb

## 2016-02-16 NOTE — Assessment & Plan Note (Addendum)
PFTs 05/06/14   FEV1  1.12 (25%) ratio 65 with 23% improvement p saba in SVC and dlco 62% corrects to 102%  FEV1/SVC = 46%  02/16/2016  After extensive coaching HFA effectiveness =    90%  - 02/16/2016 changed spiriva to respimat/ d/c'd acei  - alpha one screen 02/16/2016 >>>  Severe symptoms and airflow obst in pt with clear evidence of copd at a very young age.  DDX of  difficult airways management almost all start with A and  include Adherence, Ace Inhibitors, Acid Reflux, Active Sinus Disease, Alpha 1 Antitripsin deficiency, Anxiety masquerading as Airways dz,  ABPA,  Allergy(esp in young), Aspiration (esp in elderly), Adverse effects of meds,  Active smokers, A bunch of PE's (a small clot burden can't cause this syndrome unless there is already severe underlying pulm or vascular dz with poor reserve) plus two Bs  = Bronchiectasis and Beta blocker use..and one C= CHF   Adherence is always the initial "prime suspect" and is a multilayered concern that requires a "trust but verify" approach in every patient - starting with knowing how to use medications, especially inhalers, correctly, keeping up with refills and understanding the fundamental difference between maintenance and prns vs those medications only taken for a very short course and then stopped and not refilled.  - The proper method of use, as well as anticipated side effects, of a metered-dose inhaler are discussed and demonstrated to the patient. Improved effectiveness after extensive coaching during this visit to a level of approximately 90 % from a baseline of 75 % > try uniform inhalers rather than mixing hfa and dpi  Active smoking top of the list (see separate a/p)   ACEi also at top of the list of usual suspects (see separate a/p)   Alpha one screening critical here though suspect already done at some point not in EPIC > sent   Total time devoted to counseling  = 35/79m review case with pt/ discussion of options/alternatives/  personally creating written instructions  in presence of pt  then going over those specific  Instructions directly with the pt including how to use all of the meds but in particular covering each new medication in detail and the difference between the maintenance/automatic meds and the prns using an action plan format for the latter.

## 2016-02-16 NOTE — Patient Instructions (Addendum)
Plan A = Automatic = Advair 115 Take 2 puffs first thing in am and then another 2 puffs about 12 hours later.                                      Spiriva respimat 2 pffs each am   Plan B = Backup Only use your albuterol as a rescue medication to be used if you can't catch your breath by resting or doing a relaxed purse lip breathing pattern.  - The less you use it, the better it will work when you need it. - Ok to use the inhaler up to 2 puffs  every 4 hours if you must but call for appointment if use goes up over your usual need - Don't leave home without it !!  (think of it like the spare tire for your car)    Stop lisinopril and start diovan (valsartan) 80 mg daily   The key is to stop smoking completely before smoking completely stops you - at least cut down if you can.  Please remember to go to the lab and x-ray department downstairs for your tests - we will call you with the results when they are available.      Please schedule a follow up office visit in 6 weeks, call sooner if needed with pfts same day (ok to schedule at [facility])

## 2016-02-18 DIAGNOSIS — F17200 Nicotine dependence, unspecified, uncomplicated: Secondary | ICD-10-CM | POA: Insufficient documentation

## 2016-02-18 NOTE — Assessment & Plan Note (Signed)
In the best review of chronic cough to date ( NEJM 2016 375 [phone]) ,  ACEi are now felt to cause cough in up to  20% of pts which is a 4 fold increase from previous reports and does not include the variety of non-specific complaints we see in pulmonary clinic in pts on ACEi but previously attributed to another dx like  Copd/asthma and  include PNDS, throat and chest congestion, "bronchitis", unexplained dyspnea and noct "strangling" sensations, and hoarseness, but also  atypical /refractory GERD symptoms like dysphagia and "bad heartburn"   The only way I know  to prove this is not an "ACEi Case" is a trial off ACEi x a minimum of 6 weeks then regroup.   Try diovan 80 mg daily

## 2016-02-18 NOTE — Assessment & Plan Note (Signed)

## 2016-02-19 ENCOUNTER — Telehealth: Payer: Self-pay | Admitting: Internal Medicine

## 2016-02-19 NOTE — Telephone Encounter (Signed)
Received fax from [facility] for PA for Diovan (Valsartan) 80mg  daily.  Member ID # [id number] Spoke with Katie at [facility] - states that Diovan 80mg  is preferred by insurance.  Joellen Jersey states that this will be approved if pt has tried/failed an Acei.  Aware that the patient has tried and failed Lisinopril.  Diovan 80mg  is approved through 02/13/2017.  PA confirms # [id number] Ref # [id number]  [place] - made aware that Diovan is approved by insurance. Pharmacist states that they ship the patient is medications monthly and this will be placed to be delivered.  Nothing further needed.

## 2016-02-20 ENCOUNTER — Telehealth: Payer: Self-pay | Admitting: Orthopedic Surgery

## 2016-02-20 NOTE — Telephone Encounter (Signed)
Patient requests a refill on Hydrocodone/Acetaminophen 5-325 mgs.  Qty  120  Sig: Take 1 tablet by mouth every 4 (four) hours as needed for moderate pain (Must last 30 days. Do not take and drive a car or use machinery.).

## 2016-02-21 ENCOUNTER — Telehealth: Payer: Self-pay | Admitting: Orthopedic Surgery

## 2016-02-21 LAB — ALPHA-1-ANTITRYPSIN: A1 ANTITRYPSIN SER: 170 mg/dL (ref 83–199)

## 2016-02-21 NOTE — Telephone Encounter (Signed)
Routing to Dr Harrison for approval 

## 2016-02-21 NOTE — Telephone Encounter (Signed)
Please put in as refill request to Dr Luna Glasgow

## 2016-02-21 NOTE — Telephone Encounter (Signed)
no

## 2016-02-21 NOTE — Telephone Encounter (Signed)
Patient called and requested a refill on Hydrocodone-Acetaminophen 5-325 mgs  Qty  120  Sig: Take 1 tablet by mouth every 4 (four) hours as needed for moderate pain (Must last 30 days.  Do not take and drive a car or use machinery.). °

## 2016-02-23 LAB — ALPHA-1 ANTITRYPSIN PHENOTYPE: A-1 Antitrypsin: 139 mg/dL (ref 83–199)

## 2016-02-26 ENCOUNTER — Telehealth: Payer: Self-pay | Admitting: Orthopedic Surgery

## 2016-02-26 NOTE — Telephone Encounter (Signed)
02/22/16 patient made aware.

## 2016-02-26 NOTE — Telephone Encounter (Signed)
Patient called to inquire about either a new appointment or other advice regarding clearance for surgery.  His procedure had been cancelled due to elevated blood sugars, and patient states he is treating with his endocrinologist and primary care, and is better.  Please advise as to appointment or clearance letter.  Patient's [phone]

## 2016-02-27 NOTE — Telephone Encounter (Signed)
Patient has been seeing Dr Dorris Fetch, please see ov in chart and advise how to proceed

## 2016-02-27 NOTE — Telephone Encounter (Signed)
Patient is aware, states he needs refill on pain medication, states pcp and endo will not provide this

## 2016-02-27 NOTE — Telephone Encounter (Signed)
Tell him to have both send [place] a letter and then we can reschedule for preop appt in office with the letters

## 2016-02-28 NOTE — Telephone Encounter (Signed)
No way

## 2016-03-01 NOTE — Telephone Encounter (Signed)
PATIENT AWARE

## 2016-03-07 ENCOUNTER — Ambulatory Visit (INDEPENDENT_AMBULATORY_CARE_PROVIDER_SITE_OTHER): Payer: Medicaid Other | Admitting: "Endocrinology

## 2016-03-07 ENCOUNTER — Encounter: Payer: Self-pay | Admitting: "Endocrinology

## 2016-03-07 VITALS — BP 112/77 | HR 88 | Ht 71.0 in | Wt 237.0 lb

## 2016-03-07 DIAGNOSIS — I10 Essential (primary) hypertension: Secondary | ICD-10-CM

## 2016-03-07 DIAGNOSIS — E119 Type 2 diabetes mellitus without complications: Secondary | ICD-10-CM | POA: Diagnosis not present

## 2016-03-07 MED ORDER — INSULIN GLARGINE 100 UNIT/ML SOLOSTAR PEN: 60.0000 [IU] | PEN_INJECTOR | Freq: Every day | SUBCUTANEOUS | 3 refills | Status: DC

## 2016-03-07 NOTE — Progress Notes (Signed)
Subjective:    Patient ID: Matthew Dougherty, male    DOB: [birth year], PCP Glenda Chroman, MD   Past Medical History:  Diagnosis Date  . Arthritis   . Asthma   . Cancer ([place])    kidney  . Chronic kidney disease    partial removal of left kidney; cancer  . Chronic knee pain   . COPD (chronic obstructive pulmonary disease) ([place])   . Depression   . Diabetes mellitus without complication ([place])   . Hypertension   . Neuropathy ([place])   . Osteoarthritis   . Renal cancer ([place])    2010 left partial nephrectomy  . Restless leg syndrome   . Seizures ([place])    few years ago; states that he aspirated and had a seizure; no meds for seizures and none since then   Past Surgical History:  Procedure Laterality Date  . KNEE SURGERY Left    as child; removal of straight pin from under knee cap.  Marland Kitchen PARTIAL NEPHRECTOMY Left   . pinched nerves in back    . SEPTOPLASTY     Social History   Social History  . Marital status: Unknown    Spouse name: N/A  . Number of children: N/A  . Years of education: N/A   Social History Main Topics  . Smoking status: Current Every Day Smoker    Packs/day: 2.00    Years: 31.00    Types: Cigarettes  . Smokeless tobacco: Never Used  . Alcohol use Yes     Comment: occasional  . Drug use: No  . Sexual activity: Yes   Other Topics Concern  . None   Social History Narrative   Works as a Dealer, Careers adviser   Outpatient Encounter Prescriptions as of 03/07/2016  Medication Sig  . albuterol (PROAIR HFA) 108 (90 Base) MCG/ACT inhaler Inhale 2 puffs into the lungs every 6 (six) hours as needed for wheezing or shortness of breath.  . Blood Glucose Monitoring Suppl (ACCU-CHEK AVIVA) device Use as instructed  . citalopram (CELEXA) 40 MG tablet Take 40 mg by mouth daily.  . diclofenac sodium (VOLTAREN) 1 % GEL Apply 4 g topically 4 (four) times daily.  Marland Kitchen EASY TOUCH PEN NEEDLES 31G X 8 MM MISC DEVICE TO INJECT INSULIN 4 TIMES DAILY.  .  fluticasone-salmeterol (ADVAIR HFA) 115-21 MCG/ACT inhaler Inhale 2 puffs into the lungs 2 (two) times daily.  Marland Kitchen gabapentin (NEURONTIN) 300 MG capsule Take 300 mg by mouth 2 (two) times daily. Reported on 01/25/2016  . glucose blood (ACCU-CHEK AVIVA) test strip To test glucose 4 times a day  . HYDROcodone-acetaminophen (NORCO/VICODIN) 5-325 MG tablet Take 1 tablet by mouth every 4 (four) hours as needed for moderate pain (Must last 30 days.  Do not take and drive a car or use machinery.). (Patient not taking: Reported on 02/16/2016)  . insulin aspart (NOVOLOG) 100 UNIT/ML FlexPen Inject 10 Units into the skin 3 (three) times daily with meals.  . Insulin Glargine (LANTUS SOLOSTAR) 100 UNIT/ML Solostar Pen Inject 60 Units into the skin at bedtime.  . metFORMIN (GLUCOPHAGE) 500 MG tablet Take 1 tablet (500 mg total) by mouth 2 (two) times daily with a meal.  . metoprolol succinate (TOPROL-XL) 25 MG 24 hr tablet Take 25 mg by mouth daily.  . OXYGEN 2lpm with sleep only  [name]  . potassium chloride (MICRO-K) 10 MEQ CR capsule Take 10 mEq by mouth daily.  . predniSONE (DELTASONE) 10 MG tablet Take  4 each am x 2 days,   2 each am x 2 days,  1 each am x 2 days and stop  . Tiotropium Bromide Monohydrate (SPIRIVA RESPIMAT) 2.5 MCG/ACT AERS 2 puffs each am  . valsartan (DIOVAN) 80 MG tablet Take 1 tablet (80 mg total) by mouth daily.  Marland Kitchen zolpidem (AMBIEN) 5 MG tablet Take 5 mg by mouth at bedtime as needed for sleep.  . [DISCONTINUED] Insulin Glargine (LANTUS SOLOSTAR [place]) Inject 60 Units into the skin at bedtime.   No facility-administered encounter medications on file as of 03/07/2016.    ALLERGIES: Allergies  Allergen Reactions  . Penicillins    VACCINATION STATUS: Immunization History  Administered Date(s) Administered  . Pneumococcal-Unspecified 07/08/2012    Diabetes  He presents for his follow-up diabetic visit. He has type 2 diabetes mellitus. Onset time: He was diagnosed at approximate age  of 75 years. His disease course has been improving. There are no hypoglycemic associated symptoms. Pertinent negatives for hypoglycemia include no confusion, headaches, pallor or seizures. Associated symptoms include polydipsia, polyphagia, polyuria and visual change. Pertinent negatives for diabetes include no chest pain, no fatigue and no weakness. There are no hypoglycemic complications. Symptoms are improving. Diabetic complications include peripheral neuropathy. Risk factors for coronary artery disease include diabetes mellitus, dyslipidemia, family history, hypertension, male sex, obesity, sedentary lifestyle and tobacco exposure. Current diabetic treatment includes insulin injections and oral agent (monotherapy) (He did not keep up with his appointments, ran out of most of his medications. His most recent A1c has increased to 11.6%.). He is compliant with treatment none of the time. His weight is increasing steadily. He is following a generally unhealthy diet. When asked about meal planning, he reported none. He has not had a previous visit with a dietitian (He missed his appointments with the dietitian as well.). He never participates in exercise. He monitors blood glucose at home 1-2 x per day. Blood glucose monitoring compliance is adequate. His home blood glucose trend is decreasing steadily. His breakfast blood glucose range is generally 180-200 mg/dl. His lunch blood glucose range is generally 180-200 mg/dl. His dinner blood glucose range is generally 180-200 mg/dl. His overall blood glucose range is 180-200 mg/dl. An ACE inhibitor/angiotensin II receptor blocker is being taken. Eye exam is current.  Hyperlipidemia  This is a chronic problem. The current episode started more than 1 year ago. Pertinent negatives include no chest pain, myalgias or shortness of breath. Risk factors for coronary artery disease include family history, dyslipidemia, diabetes mellitus, hypertension, male sex, obesity and a  sedentary lifestyle.  Hypertension  This is a chronic problem. The current episode started more than 1 year ago. Pertinent negatives include no chest pain, headaches, neck pain, palpitations or shortness of breath. Risk factors for coronary artery disease include diabetes mellitus, dyslipidemia, male gender, smoking/tobacco exposure, sedentary lifestyle and obesity. Past treatments include ACE inhibitors.     Review of Systems  Constitutional: Negative for chills, fatigue, fever and unexpected weight change.  HENT: Negative for dental problem, mouth sores and trouble swallowing.   Eyes: Negative for visual disturbance.  Respiratory: Negative for cough, choking, chest tightness, shortness of breath and wheezing.   Cardiovascular: Negative for chest pain, palpitations and leg swelling.  Gastrointestinal: Negative for abdominal distention, abdominal pain, constipation, diarrhea, nausea and vomiting.  Endocrine: Positive for polydipsia, polyphagia and polyuria.  Genitourinary: Negative for dysuria, flank pain, hematuria and urgency.  Musculoskeletal: Negative for back pain, gait problem, myalgias and neck pain.  Skin: Negative  for pallor, rash and wound.  Neurological: Negative for seizures, syncope, weakness, numbness and headaches.  Psychiatric/Behavioral: Negative.  Negative for confusion and dysphoric mood.    Objective:    BP 112/77   Pulse 88   Ht 5' 11"  (1.803 m)   Wt 237 lb (107.5 kg)   BMI 33.05 kg/m   Wt Readings from Last 3 Encounters:  03/07/16 237 lb (107.5 kg)  02/16/16 226 lb (102.5 kg)  02/08/16 229 lb 6 oz (104 kg)    Physical Exam  Constitutional: He is oriented to person, place, and time. He appears well-developed. He is cooperative. No distress.  Obese, disheveled. Has poor hygiene.  HENT:  Head: Normocephalic and atraumatic.  He has poor oral hygiene.   Eyes: EOM are normal.  Neck: Normal range of motion. Neck supple. No tracheal deviation present. No  thyromegaly present.  Cardiovascular: Normal rate, S1 normal, S2 normal and normal heart sounds.  Exam reveals no gallop.   No murmur heard. Pulses:      Dorsalis pedis pulses are 1+ on the right side, and 1+ on the left side.       Posterior tibial pulses are 1+ on the right side, and 1+ on the left side.  Pulmonary/Chest: Breath sounds normal. No respiratory distress. He has no wheezes.  Abdominal: Soft. Bowel sounds are normal. He exhibits no distension. There is no tenderness. There is no guarding and no CVA tenderness.  Musculoskeletal: He exhibits no edema.       Right shoulder: He exhibits no swelling and no deformity.  Neurological: He is alert and oriented to person, place, and time. He has normal strength and normal reflexes. No cranial nerve deficit or sensory deficit. Gait normal.  Skin: Skin is warm and dry. No rash noted. No cyanosis. Nails show no clubbing.  Psychiatric: His speech is normal. Cognition and memory are normal.  Noncompliant/reluctant affect.     Recent Results (from the past 2160 hour(s))  CBC     Status: None   Collection Time: 01/05/16 11:10 AM  Result Value Ref Range   WBC 10.2 4.0 - 10.5 K/uL   RBC 5.23 4.22 - 5.81 MIL/uL   Hemoglobin 17.0 13.0 - 17.0 g/dL   HCT 48.7 39.0 - 52.0 %   MCV 93.1 78.0 - 100.0 fL   MCH 32.5 26.0 - 34.0 pg   MCHC 34.9 30.0 - 36.0 g/dL   RDW 13.2 11.5 - 15.5 %   Platelets 261 150 - 400 K/uL  Basic metabolic panel     Status: Abnormal   Collection Time: 01/05/16 11:10 AM  Result Value Ref Range   Sodium 130 (L) 135 - 145 mmol/L   Potassium 4.7 3.5 - 5.1 mmol/L   Chloride 98 (L) 101 - 111 mmol/L   CO2 25 22 - 32 mmol/L   Glucose, Bld 365 (H) 65 - 99 mg/dL   BUN 14 6 - 20 mg/dL   Creatinine, Ser 0.71 0.61 - 1.24 mg/dL   Calcium 9.6 8.9 - 10.3 mg/dL   GFR calc non Af Amer >60 >60 mL/min   GFR calc Af Amer >60 >60 mL/min    Comment: (NOTE) The eGFR has been calculated using the CKD EPI equation. This calculation has  not been validated in all clinical situations. eGFR's persistently <60 mL/min signify possible Chronic Kidney Disease.    Anion gap 7 5 - 15  Glucose, capillary     Status: Abnormal   Collection Time: 01/11/16  7:43  AM  Result Value Ref Range   Glucose-Capillary 345 (H) 65 - 99 mg/dL  Basic metabolic panel     Status: Abnormal   Collection Time: 01/16/16  9:24 AM  Result Value Ref Range   Glucose 195 (H) 65 - 99 mg/dL   BUN 10 6 - 24 mg/dL   Creatinine, Ser 0.80 0.76 - 1.27 mg/dL   GFR calc non Af Amer 111 >59 mL/min/1.73   GFR calc Af Amer 128 >59 mL/min/1.73   BUN/Creatinine Ratio 13 9 - 20   Sodium 139 134 - 144 mmol/L   Potassium 4.4 3.5 - 5.2 mmol/L   Chloride 98 96 - 106 mmol/L   CO2 23 18 - 29 mmol/L   Calcium 9.2 8.7 - 10.2 mg/dL  Hgb A1c w/o eAG     Status: Abnormal   Collection Time: 01/16/16  9:24 AM  Result Value Ref Range   Hgb A1c MFr Bld 11.6 (H) 4.8 - 5.6 %    Comment:          Pre-diabetes: 5.7 - 6.4          Diabetes: >6.4          Glycemic control for adults with diabetes: <7.0   POCT glucose (manual entry)     Status: Abnormal   Collection Time: 01/25/16 10:31 AM  Result Value Ref Range   POC Glucose 347 (A) 70 - 99 mg/dl    Comment: fasting-coffee only   Alpha-1 antitrypsin phenotype     Status: None   Collection Time: 02/16/16 11:17 AM  Result Value Ref Range   A-1 Antitrypsin 139 83 - 199 mg/dL   Results Received SEE NOTE     Comment: THIS PATIENT'S ALPHA-1-ANTITRYPSIN PHENOTYPE IS PI*MM.   90% of normal individuals have the MM phenotype, with normal quantitative AAT levels. Many phenotypic patterns have been described, including deficiency states with F, S, Z, or other alleles. As a general estimation, compared to M allele of 100% of normal A-1-Antitrypsin protein, the S allele produces approximately 60% and the Z allele 20%. For example, an MS phenotype would have about 80% of normal A-1-Antitrypsin protein level, a 50% contribution from the  M allele and 30% from the S allele. A ZZ phenotype would have about 20% of normal levels, a 10% contribution from each Z gene. The F allele has normal A-1-Antitrypsin levels, but the kinetics of elastase inhibition is not as efficient as an M allele product; F alleles should be considered functionally mildly deficient. Other variants are identifiable by phenotypic analysis. These include CM, DP, EM, GM, IS, LM, M1M2, M3M3, MP, MT, XX, MY, and M1N. I, P, T and nu ll alleles are considered deleterious. C, D, E, G, L, M1, M2, M3, X and Y alleles are generally considered normal variants. The MZ-Pratt phenotype is a normal variant; care should be taken to avoid confusion with the deficient MZ phenotype.   Alpha-1-antitrypsin     Status: None   Collection Time: 02/16/16 11:17 AM  Result Value Ref Range   A-1 Antitrypsin, Ser 170 83 - 199 mg/dL     Assessment & Plan:   1. Uncontrolled type 2 diabetes mellitus with complication, with long-term current use of insulin ([place])  His diabetes is  complicated by noncompliance/nonadherence, peripheral neuropathy, obesity, and patient remains at a high risk for more acute and chronic complications of diabetes which include CAD, CVA, CKD, retinopathy, and neuropathy. These are all discussed in detail with the patient.  Patient  came with inadequate glucose profile, and  recent A1c of 11.6 %.  Glucose logs and insulin administration records pertaining to this visit,  to be scanned into patient's records.  Recent labs reviewed.   - I have re-counseled the patient on diet management and weight loss  by adopting a carbohydrate restricted / protein rich  Diet.  - Suggestion is made for patient to avoid simple carbohydrates   from their diet including Cakes , Desserts, Ice Cream,  Soda (  diet and regular) , Sweet Tea , Candies,  Chips, Cookies, Artificial Sweeteners,   and "Sugar-free" Products .  This will help patient to have stable blood glucose profile  and potentially avoid unintended  Weight gain.  - Patient is advised to stick to a routine mealtimes to eat 3 meals  a day and avoid unnecessary snacks ( to snack only to correct hypoglycemia).  - The patient  has been  scheduled with Jearld Fenton, RDN, CDE for individualized DM education.  - I have approached patient with the following individualized plan to manage diabetes and patient agrees.  - He came with a log showing 3-4 readings, averaging 203 over the last 14 days. - I approached him for better engagement because he needs basal/bolus insulin. - I will increase his basal insulin Lantus to 60 units QHS, continue NovoLog 10 units 3 times a day before meals for pre-meal blood glucose between 90 and 150 minute grams per deciliter plus correction for above 150 mg/dL, associated with strict monitoring of glucose  AC and HS.  -Adjustment parameters are given for hypo and hyperglycemia in writing. -Patient is encouraged to call clinic for blood glucose levels less than 70 or above 300 mg /dl. - I will continue metformin 500 mg by mouth twice a day, therapeutically suitable for patient. -I will discontinue his Invokana. -  He has history of left partial nephrectomy for reported renal cell carcinoma hence he only has right kidney.  - Patient specific target  for A1c; LDL, HDL, Triglycerides, and  Waist Circumference were discussed in detail.  2) BP/HTN: Controlled. Continue current medications including ACEI/ARB. 3) Lipids/HPL: Control unknown, he is not on statins. I will obtain fasting lipid panel on subsequent visit. 4)  Weight/Diet: CDE consult in progress, exercise, and carbohydrates information provided.  5) Chronic Care/Health Maintenance:  -Patient is on ACEI/ARB and encouraged to continue to follow up with Ophthalmology, Podiatrist at least yearly or according to recommendations, and advised to quit smoking. I have recommended yearly flu vaccine and pneumonia vaccination at least  every 5 years; moderate intensity exercise for up to 150 minutes weekly; and  sleep for at least 7 hours a day.  - 25 minutes of time was spent on the care of this patient , 50% of which was applied for counseling on diabetes complications and their preventions.  - I advised patient to maintain close follow up with Glenda Chroman, MD for primary care needs.  Patient is asked to bring meter and  blood glucose logs during their next visit.   Follow up plan: -Return in about 9 weeks (around 05/09/2016) for meter, and logs.  Glade Lloyd, MD Phone: [phone]  Fax: [phone]   03/07/2016, 11:57 AM

## 2016-03-07 NOTE — Patient Instructions (Signed)

## 2016-03-16 ENCOUNTER — Other Ambulatory Visit: Payer: Self-pay | Admitting: "Endocrinology

## 2016-03-29 ENCOUNTER — Ambulatory Visit (HOSPITAL_COMMUNITY)
Admission: RE | Admit: 2016-03-29 | Discharge: 2016-03-29 | Disposition: A | Discharge status: Home / Self Care | Payer: Medicaid Other | Source: Ambulatory Visit | Attending: Internal Medicine | Admitting: Internal Medicine

## 2016-03-29 ENCOUNTER — Encounter: Payer: Self-pay | Admitting: Internal Medicine

## 2016-03-29 ENCOUNTER — Ambulatory Visit (INDEPENDENT_AMBULATORY_CARE_PROVIDER_SITE_OTHER): Payer: Medicaid Other | Admitting: Internal Medicine

## 2016-03-29 VITALS — BP 126/74 | HR 93 | Ht 71.0 in | Wt 239.6 lb

## 2016-03-29 DIAGNOSIS — F17210 Nicotine dependence, cigarettes, uncomplicated: Secondary | ICD-10-CM

## 2016-03-29 DIAGNOSIS — Z23 Encounter for immunization: Secondary | ICD-10-CM | POA: Diagnosis not present

## 2016-03-29 DIAGNOSIS — R942 Abnormal results of pulmonary function studies: Secondary | ICD-10-CM | POA: Diagnosis not present

## 2016-03-29 DIAGNOSIS — J449 Chronic obstructive pulmonary disease, unspecified: Secondary | ICD-10-CM | POA: Diagnosis not present

## 2016-03-29 DIAGNOSIS — I10 Essential (primary) hypertension: Secondary | ICD-10-CM | POA: Diagnosis not present

## 2016-03-29 LAB — PULMONARY FUNCTION TEST
DL/VA % PRED: 102 %
DL/VA: 4.83 ml/min/mmHg/L
DLCO UNC: 22.35 ml/min/mmHg
DLCO unc % pred: 66 %
FEF 25-75 POST: 1.81 L/s
FEF 25-75 Pre: 0.6 L/sec
FEF2575-%Change-Post: 202 %
FEF2575-%PRED-PRE: 15 %
FEF2575-%Pred-Post: 45 %
FEV1-%Change-Post: 39 %
FEV1-%PRED-PRE: 34 %
FEV1-%Pred-Post: 48 %
FEV1-Post: 2.07 L
FEV1-Pre: 1.49 L
FEV1FVC-%Change-Post: 10 %
FEV1FVC-%PRED-PRE: 71 %
FEV6-%CHANGE-POST: 32 %
FEV6-%PRED-POST: 61 %
FEV6-%Pred-Pre: 46 %
FEV6-POST: 3.22 L
FEV6-Pre: 2.44 L
FEV6FVC-%CHANGE-POST: 4 %
FEV6FVC-%PRED-POST: 100 %
FEV6FVC-%Pred-Pre: 96 %
FVC-%Change-Post: 26 %
FVC-%Pred-Post: 60 %
FVC-%Pred-Pre: 48 %
FVC-Post: 3.3 L
FVC-Pre: 2.62 L
PRE FEV1/FVC RATIO: 57 %
Post FEV1/FVC ratio: 63 %
Post FEV6/FVC ratio: 98 %
Pre FEV6/FVC Ratio: 94 %
RV % pred: 232 %
RV: 4.47 L
TLC % PRED: 100 %
TLC: 7.2 L

## 2016-03-29 MED ORDER — BUDESONIDE-FORMOTEROL FUMARATE 160-4.5 MCG/ACT IN AERO: 2.0000 | INHALATION_SPRAY | Freq: Two times a day (BID) | RESPIRATORY_TRACT | 11 refills | Status: DC

## 2016-03-29 MED ORDER — ALBUTEROL SULFATE (2.5 MG/3ML) 0.083% IN NEBU
2.5000 mg | INHALATION_SOLUTION | Freq: Once | RESPIRATORY_TRACT | Status: AC
  Administered 2016-03-29: 2.5 mg via RESPIRATORY_TRACT

## 2016-03-29 NOTE — Assessment & Plan Note (Signed)
>   3 min Discussed the risks and costs (both direct and indirect)  of smoking relative to the benefits of quitting but patient unwilling to commit at this point to a specific quit date.    Although I don't endorse regular use of e cigs/ many pts find them helpful; however, I emphasized they should be considered a "one-way bridge" off all tobacco products.  

## 2016-03-29 NOTE — Progress Notes (Signed)
Subjective:    Patient ID: Matthew Dougherty, male    DOB: [birth year],    MRN: [id number]    Brief patient profile:  40 yowm active smoker  from [place] good ex tol as teenager though had dx of asthma starting around age 42-14 and able to play to football/ cross country and had inhaler but didn't use it much until around 2010 need for saba increased and around 2014 placed on maint rx with advair but not better with criteria for severe copd in 04/2014 referred to pulmonary clinic 02/16/2016 by Dr Woody Seller   History of Present Illness  02/16/2016 1st [facility] office visit/ Matthew Dougherty  maint rx advair hfa  spiriva dpi / acei Chief Complaint  Patient presents with  . Pulmonary Consult    Referred by Dr. Woody Seller. Pt states dxed with COPD in 2015. He c/o increased cough and SOB over the past year. He states that some days he gets winded just walking around his house. His cough is prod with clear sputum and tends to be worse first thing in the am and then at night. He states that his symptoms can flare when exposed to certain smells and also with weather change.   placed on acei x 10 y and cough worse x around 2 years prior to OV  - no classic pleuritic or ex cp Doe = MMRC4  = sob if tries to leave home or while getting dressed   Some better with saba rec Plan A = Automatic = Advair 115 Take 2 puffs first thing in am and then another 2 puffs about 12 hours later.                                      Spiriva respimat 2 pffs each am  Plan B = Backup Only use your albuterol as a rescue medication to be used if you can't catch your breath by resting or doing a relaxed purse lip breathing pattern.  Stop lisinopril and start diovan (valsartan) 80 mg daily  The key is to stop smoking completely before smoking completely stops you - at least cut down if you can.       03/29/2016  f/u ov/Crawford Tamura re: COPD III/ AB /still smoking on advair /spiriva respimat Chief Complaint  Patient presents with  . Follow-up      PFT's done today. He still smokes 1 ppd. He states that he is interested in quitting. His breathing is unchanged since the last visit.   cough is better, amble to walk full wm = MMRC2 = can't walk a nl pace on a flat grade s sob but does fine slow and flat  Only using albuterol when over does it    No obvious day to day or daytime variability or assoc excess/ purulent sputum or mucus plugs or hemoptysis or cp or chest tightness, subjective wheeze or overt sinus or hb symptoms. No unusual exp hx or h/o childhood pna/ asthma or knowledge of premature birth.  Sleeping ok without nocturnal  or early am exacerbation  of respiratory  c/o's or need for noct saba. Also denies any obvious fluctuation of symptoms with weather or environmental changes or other aggravating or alleviating factors except as outlined above   Current Medications, Allergies, Complete Past Medical History, Past Surgical History, Family History, and Social History were reviewed in [employer] record.  ROS  The  following are not active complaints unless bolded sore throat, dysphagia, dental problems, itching, sneezing,  nasal congestion or excess/ purulent secretions, ear ache,   fever, chills, sweats, unintended wt loss, classically pleuritic or exertional cp,  orthopnea pnd or leg swelling, presyncope, palpitations, abdominal pain, anorexia, nausea, vomiting, diarrhea  or change in bowel or bladder habits, change in stools or urine, dysuria,hematuria,  rash, arthralgias, visual complaints, headache, numbness, weakness or ataxia or problems with walking or coordination,  change in mood/affect or memory.               Objective:   Physical Exam   03/29/2016       239  02/16/16 226 lb (102.5 kg)  02/08/16 229 lb 6 oz (104 kg)  01/25/16 222 lb (100.7 kg)    Vital signs reviewed - 02 sat 94% RA on arrival   HEENT: nl dentition, turbinates, and oropharynx. Nl external ear canals without cough  reflex   NECK :  without JVD/Nodes/TM/ nl carotid upstrokes bilaterally   LUNGS: no acc muscle use,  Nl contour chest  With insp/exp rhonchi bilaterally   CV:  RRR  no s3 or murmur or increase in P2, no edema   ABD:  soft and nontender with nl inspiratory excursion in the supine position. No bruits or organomegaly, bowel sounds nl  MS:  Nl gait/ ext warm without deformities, calf tenderness, cyanosis or clubbing No obvious joint restrictions   SKIN: warm and dry without lesions    NEURO:  alert, approp, nl sensorium with  no motor deficits    CXR PA and Lateral:   02/16/2016 :    I personally reviewed images and agree with radiology impression as follows:   Mild hyperinflation. No infiltrate or pulmonary edema. Minimal perihilar bronchitic changes.               Assessment & Plan:

## 2016-03-29 NOTE — Patient Instructions (Addendum)
Stop advair and symbicort 160 Take 2 puffs first thing in am and then another 2 puffs about 12 hours later.   Continue spiriva 2puff each am   Call me if problems acquiring spiriva   The key is to stop smoking completely before smoking completely stops you - it's not too late   Please schedule a follow up visit in 3 months but call sooner if needed

## 2016-03-29 NOTE — Assessment & Plan Note (Signed)
PFTs 05/06/14   FEV1  1.12 (25%) ratio 65 with 23% improvement p saba in SVC and dlco 62% corrects to 102%  FEV1/SVC = 46%   - 02/16/2016 changed spiriva to respimat/ d/c'd acei  - alpha one screen 02/16/2016 >  MM - PFT's  03/29/2016  FEV1 2.07 (48 % ) ratio 63  p 39 % improvement from saba p nothing prior to study with DLCO  66 % corrects to 102  % for alv volume   - 03/29/2016  After extensive coaching HFA effectiveness =    90% try symb 160 2bid instead of advair  Symptoms are markedly disproportionate to objective findings and not clear this is a lung problem but pt does appear to have difficult airway management issues. DDX of  difficult airways management almost all start with A and  include Adherence, Ace Inhibitors, Acid Reflux, Active Sinus Disease, Alpha 1 Antitripsin deficiency, Anxiety masquerading as Airways dz,  ABPA,  Allergy(esp in young), Aspiration (esp in elderly), Adverse effects of meds,  Active smokers, A bunch of PE's (a small clot burden can't cause this syndrome unless there is already severe underlying pulm or vascular dz with poor reserve) plus two Bs  = Bronchiectasis and Beta blocker use..and one C= CHF   Adherence is always the initial "prime suspect" and is a multilayered concern that requires a "trust but verify" approach in every patient - starting with knowing how to use medications, especially inhalers, correctly, keeping up with refills and understanding the fundamental difference between maintenance and prns vs those medications only taken for a very short course and then stopped and not refilled.   ? acei > clearly better off  ? Adverse effect of advair > try symbicort 160 2bid  ? Allergy/asthma > max dose symbicort   ? Anxiety/depression/ deconditioning clearly an issue    I had an extended discussion with the patient reviewing all relevant studies completed to date and  lasting 15 to 20 minutes of a 25 minute visit    Each maintenance medication was reviewed  in detail including most importantly the difference between maintenance and prns and under what circumstances the prns are to be triggered using an action plan format that is not reflected in the computer generated alphabetically organized AVS.    Please see instructions for details which were reviewed in writing and the patient given a copy highlighting the part that I personally wrote and discussed at today's ov.

## 2016-03-29 NOTE — Assessment & Plan Note (Signed)
Body mass index is 33.42 kg/m.  No results found for: TSH   Contributing to gerd tendency/ doe/reviewed the need and the process to achieve and maintain neg calorie balance > defer f/u primary care including intermittently monitoring thyroid status    Cautioned re wt gain with smoking cessation

## 2016-03-29 NOTE — Assessment & Plan Note (Signed)
Try off acei 02/16/2016 due to severe copd /cough > improved 03/29/2016   Although even in retrospect it may not be clear the ACEi contributed to the pt's symptoms,  Pt improved off them and adding them back at this point or in the future would risk confusion in interpretation of non-specific respiratory symptoms to which this patient is prone  ie  Better not to muddy the waters here.

## 2016-04-05 ENCOUNTER — Other Ambulatory Visit: Payer: Self-pay | Admitting: "Endocrinology

## 2016-04-08 ENCOUNTER — Other Ambulatory Visit: Payer: Self-pay | Admitting: "Endocrinology

## 2016-04-09 ENCOUNTER — Other Ambulatory Visit: Payer: Self-pay

## 2016-04-09 MED ORDER — INSULIN GLARGINE 100 UNIT/ML SOLOSTAR PEN: 60.0000 [IU] | PEN_INJECTOR | Freq: Every day | SUBCUTANEOUS | 3 refills | Status: DC

## 2016-04-09 MED ORDER — GLUCOSE BLOOD VI STRP: ORAL_STRIP | 5 refills | Status: DC

## 2016-04-13 ENCOUNTER — Emergency Department (HOSPITAL_COMMUNITY)
Admission: EM | Admit: 2016-04-13 | Discharge: 2016-04-14 | Disposition: A | Discharge status: Home / Self Care | Payer: Medicaid Other | Attending: Emergency Medicine | Admitting: Emergency Medicine

## 2016-04-13 ENCOUNTER — Encounter (HOSPITAL_COMMUNITY): Payer: Self-pay | Admitting: Emergency Medicine

## 2016-04-13 DIAGNOSIS — F17210 Nicotine dependence, cigarettes, uncomplicated: Secondary | ICD-10-CM | POA: Insufficient documentation

## 2016-04-13 DIAGNOSIS — I129 Hypertensive chronic kidney disease with stage 1 through stage 4 chronic kidney disease, or unspecified chronic kidney disease: Secondary | ICD-10-CM | POA: Diagnosis not present

## 2016-04-13 DIAGNOSIS — M25561 Pain in right knee: Secondary | ICD-10-CM | POA: Diagnosis present

## 2016-04-13 DIAGNOSIS — J449 Chronic obstructive pulmonary disease, unspecified: Secondary | ICD-10-CM | POA: Insufficient documentation

## 2016-04-13 DIAGNOSIS — Z794 Long term (current) use of insulin: Secondary | ICD-10-CM | POA: Diagnosis not present

## 2016-04-13 DIAGNOSIS — Z85528 Personal history of other malignant neoplasm of kidney: Secondary | ICD-10-CM | POA: Diagnosis not present

## 2016-04-13 DIAGNOSIS — E1122 Type 2 diabetes mellitus with diabetic chronic kidney disease: Secondary | ICD-10-CM | POA: Diagnosis not present

## 2016-04-13 DIAGNOSIS — N189 Chronic kidney disease, unspecified: Secondary | ICD-10-CM | POA: Insufficient documentation

## 2016-04-13 DIAGNOSIS — Z7984 Long term (current) use of oral hypoglycemic drugs: Secondary | ICD-10-CM | POA: Insufficient documentation

## 2016-04-13 DIAGNOSIS — Z79899 Other long term (current) drug therapy: Secondary | ICD-10-CM | POA: Insufficient documentation

## 2016-04-13 DIAGNOSIS — J45909 Unspecified asthma, uncomplicated: Secondary | ICD-10-CM | POA: Diagnosis not present

## 2016-04-13 HISTORY — DX: Gout, unspecified: M10.9

## 2016-04-13 MED ORDER — OXYCODONE-ACETAMINOPHEN 5-325 MG PO TABS
1.0000 | ORAL_TABLET | Freq: Once | ORAL | Status: AC
  Administered 2016-04-14: 1 via ORAL
  Filled 2016-04-13: qty 1

## 2016-04-13 MED ORDER — KETOROLAC TROMETHAMINE 60 MG/2ML IM SOLN
60.0000 mg | Freq: Once | INTRAMUSCULAR | Status: AC
  Administered 2016-04-14: 60 mg via INTRAMUSCULAR
  Filled 2016-04-13: qty 2

## 2016-04-13 NOTE — ED Triage Notes (Signed)
Pt reports R hip and knee pain that started 3 days ago.Pt has no ACL and has degenerative arthritis. Pt was to have surgery but has not been able to schedule it yet.

## 2016-04-13 NOTE — ED Provider Notes (Signed)
[facility] DEPT Provider Note   CSN: [id number] Arrival date & time: 04/13/16  2020 By signing my name below, I, Georgette Shell, attest that this documentation has been prepared under the direction and in the presence of Orpah Greek, MD. Electronically Signed: Georgette Shell, ED Scribe. 04/13/16. 11:43 PM.  History   Chief Complaint Chief Complaint  Patient presents with  . Knee Pain  . Hip Pain   HPI Comments: ANDRES KAWCZYNSKI is a 40 y.o. male with h/o arthritis and DM who presents to the Emergency Department complaining of right hip and right knee pain onset 3 days ago. Pain is exacerbated with movement and palpation. No alleviating factors noted. Pt states he is suppose to have surgery but has yet to be scheduled. Denies fever.   The history is provided by the patient. No language interpreter was used.    Past Medical History:  Diagnosis Date  . Arthritis   . Asthma   . Cancer ([place])    kidney  . Chronic kidney disease    partial removal of left kidney; cancer  . Chronic knee pain   . COPD (chronic obstructive pulmonary disease) ([place])   . Depression   . Diabetes mellitus without complication ([place])   . Gout   . Hypertension   . Neuropathy ([place])   . Osteoarthritis   . Renal cancer ([place])    2010 left partial nephrectomy  . Restless leg syndrome   . Seizures ([place])    few years ago; states that he aspirated and had a seizure; no meds for seizures and none since then    Patient Active Problem List   Diagnosis Date Noted  . Cigarette smoker 02/18/2016  . COPD GOLD III/ still smoking with restrictive component  02/16/2016  . Morbid obesity due to excess calories ([place]) 01/25/2016  . Personal history of noncompliance with medical treatment, presenting hazards to health 01/25/2016  . Essential hypertension 10/18/2015  . Diabetes mellitus without complication ([place]) [id number]    Past Surgical History:  Procedure Laterality Date  . KNEE SURGERY Left    as child;  removal of straight pin from under knee cap.  [facility] PARTIAL NEPHRECTOMY Left   . pinched nerves in back    . SEPTOPLASTY         Home Medications    Prior to Admission medications   Medication Sig Start Date End Date Taking? Authorizing Provider  ACCU-CHEK AVIVA PLUS test strip TEST BLOOD SUGAR 4 TIMES DAILY. 03/18/16   Cassandria Anger, MD  albuterol (PROAIR HFA) 108 (90 Base) MCG/ACT inhaler Inhale 2 puffs into the lungs every 6 (six) hours as needed for wheezing or shortness of breath.    Historical Provider, MD  Blood Glucose Monitoring Suppl (ACCU-CHEK AVIVA) device Use as instructed 02/08/16   Cassandria Anger, MD  budesonide-formoterol [facility]) 160-4.5 MCG/ACT inhaler Inhale 2 puffs into the lungs 2 (two) times daily. 03/29/16   Tanda Rockers, MD  citalopram (CELEXA) 40 MG tablet Take 40 mg by mouth daily.    Historical Provider, MD  diclofenac sodium (VOLTAREN) 1 % GEL Apply 4 g topically 4 (four) times daily. 11/30/15   Sanjuana Kava, MD  EASY TOUCH PEN NEEDLES 31G X 8 MM MISC DEVICE TO INJECT INSULIN 4 TIMES DAILY. 01/26/16   Cassandria Anger, MD  fluticasone-salmeterol (ADVAIR HFA) [id number] MCG/ACT inhaler Inhale 2 puffs into the lungs 2 (two) times daily. 02/16/16   Tanda Rockers, MD  gabapentin (NEURONTIN) 300 MG capsule  Take 300 mg by mouth 2 (two) times daily. Reported on 01/25/2016    Historical Provider, MD  glucose blood test strip Use as instructed 4 x daily. e11.65 04/09/16   Cassandria Anger, MD  HYDROcodone-acetaminophen (NORCO/VICODIN) 5-325 MG tablet Take 2 tablets by mouth every 4 (four) hours as needed. 04/14/16   Orpah Greek, MD  ibuprofen (ADVIL,MOTRIN) 800 MG tablet Take 1 tablet (800 mg total) by mouth 3 (three) times daily. 04/14/16   Orpah Greek, MD  Insulin Glargine (LANTUS SOLOSTAR) 100 UNIT/ML Solostar Pen Inject 60 Units into the skin at bedtime. 04/09/16   Cassandria Anger, MD  metFORMIN (GLUCOPHAGE) 500 MG tablet TAKE (1)  TABLET TWICE A DAY WITH MEALS. 04/09/16   Cassandria Anger, MD  metoprolol succinate (TOPROL-XL) 25 MG 24 hr tablet Take 25 mg by mouth daily.    Historical Provider, MD  NOVOLOG FLEXPEN 100 UNIT/ML FlexPen INJECT 10 UNITS SUBCUTANEOUSLY 3 TIMES DAILY BEFORE MEALS. 03/18/16   Cassandria Anger, MD  OXYGEN 2lpm with sleep only  [name]    Historical Provider, MD  potassium chloride (MICRO-K) 10 MEQ CR capsule Take 10 mEq by mouth daily.    Historical Provider, MD  Tiotropium Bromide Monohydrate (SPIRIVA RESPIMAT) 2.5 MCG/ACT AERS 2 puffs each am 02/16/16   Tanda Rockers, MD  traMADol (ULTRAM) 50 MG tablet Take 1 tablet (50 mg total) by mouth every 6 (six) hours as needed. 04/14/16   Orpah Greek, MD  valsartan (DIOVAN) 80 MG tablet Take 1 tablet (80 mg total) by mouth daily. 02/16/16   Tanda Rockers, MD  zolpidem (AMBIEN) 5 MG tablet Take 5 mg by mouth at bedtime as needed for sleep.    Historical Provider, MD    Family History Family History  Problem Relation Age of Onset  . Breast cancer Mother   . Melanoma Father     Social History Social History  Substance Use Topics  . Smoking status: Current Every Day Smoker    Packs/day: 1.00    Years: 31.00    Types: Cigarettes  . Smokeless tobacco: Never Used  . Alcohol use Yes     Comment: occasional     Allergies   Penicillins   Review of Systems Review of Systems  Constitutional: Negative for fever.  Musculoskeletal: Positive for arthralgias and myalgias.  All other systems reviewed and are negative.    Physical Exam Updated Vital Signs BP 128/78 (BP Location: Left Arm)   Pulse 108   Temp 98.9 F (37.2 C) (Oral)   Resp 20   Ht 5\' 11"  (1.803 m)   Wt 239 lb (108.4 kg)   SpO2 96%   BMI 33.33 kg/m   Physical Exam  Constitutional: He is oriented to person, place, and time. He appears well-developed and well-nourished. No distress.  HENT:  Head: Normocephalic and atraumatic.  Right Ear: Hearing normal.    Left Ear: Hearing normal.  Nose: Nose normal.  Mouth/Throat: Oropharynx is clear and moist and mucous membranes are normal.  Eyes: Conjunctivae and EOM are normal. Pupils are equal, round, and reactive to light.  Neck: Normal range of motion. Neck supple.  Cardiovascular: Regular rhythm, S1 normal and S2 normal.  Exam reveals no gallop and no friction rub.   No murmur heard. Pulmonary/Chest: Effort normal and breath sounds normal. No respiratory distress. He exhibits no tenderness.  Abdominal: Soft. Normal appearance and bowel sounds are normal. There is no hepatosplenomegaly. There is no tenderness. There  is no rebound, no guarding, no tenderness at McBurney's point and negative Murphy's sign. No hernia.  Musculoskeletal: He exhibits tenderness.  Tenderness with painful ROM of right knee. No erythema, warmth, or effusion.  Neurological: He is alert and oriented to person, place, and time. He has normal strength. No cranial nerve deficit or sensory deficit. Coordination normal. GCS eye subscore is 4. GCS verbal subscore is 5. GCS motor subscore is 6.  Skin: Skin is warm, dry and intact. No rash noted. No cyanosis.  Psychiatric: He has a normal mood and affect. His speech is normal and behavior is normal. Thought content normal.  Nursing note and vitals reviewed.    ED Treatments / Results  DIAGNOSTIC STUDIES: Oxygen Saturation is 96% on RA, adequate by my interpretation.    COORDINATION OF CARE: 11:42 PM Discussed treatment plan with pt at bedside which includes brace and pt agreed to plan.  Labs (all labs ordered are listed, but only abnormal results are displayed) Labs Reviewed - No data to display  EKG  EKG Interpretation None       Radiology No results found.  Procedures Procedures (including critical care time)  Medications Ordered in ED Medications  ketorolac (TORADOL) injection 60 mg (60 mg Intramuscular Given 04/14/16 0005)  oxyCODONE-acetaminophen  (PERCOCET/ROXICET) 5-325 MG per tablet 1 tablet (1 tablet Oral Given 04/14/16 0005)     Initial Impression / Assessment and Plan / ED Course  I have reviewed the triage vital signs and the nursing notes.  Pertinent labs & imaging results that were available during my care of the patient were reviewed by me and considered in my medical decision making (see chart for details).  Clinical Course    Patient with history of chronic right knee pain secondary to osteoarthritis presents with worsening pain. There is no joint effusion. Patient does not have any erythema or warmth. No concern for joint infection. No recent injury, no need for imaging. Patient has follow-up with his orthopedic surgeon on Tuesday. Treat with analgesia and rest.  Final Clinical Impressions(s) / ED Diagnoses   Final diagnoses:  Acute pain of right knee   I personally performed the services described in this documentation, which was scribed in my presence. The recorded information has been reviewed and is accurate.   New Prescriptions Discharge Medication List as of 04/14/2016 12:25 AM    START taking these medications   Details  HYDROcodone-acetaminophen (NORCO/VICODIN) 5-325 MG tablet Take 2 tablets by mouth every 4 (four) hours as needed., Starting Sun 04/14/2016, Print    ibuprofen (ADVIL,MOTRIN) 800 MG tablet Take 1 tablet (800 mg total) by mouth 3 (three) times daily., Starting Sun 04/14/2016, Print    traMADol (ULTRAM) 50 MG tablet Take 1 tablet (50 mg total) by mouth every 6 (six) hours as needed., Starting Sun 04/14/2016, Print         Orpah Greek, MD 04/14/16 [phone]

## 2016-04-14 MED ORDER — IBUPROFEN 800 MG PO TABS: 800.0000 mg | ORAL_TABLET | Freq: Three times a day (TID) | ORAL | 0 refills | Status: DC

## 2016-04-14 MED ORDER — TRAMADOL HCL 50 MG PO TABS: 50.0000 mg | ORAL_TABLET | Freq: Four times a day (QID) | ORAL | 0 refills | Status: DC | PRN

## 2016-04-14 MED ORDER — HYDROCODONE-ACETAMINOPHEN 5-325 MG PO TABS: 2.0000 | ORAL_TABLET | ORAL | 0 refills | Status: DC | PRN

## 2016-04-14 NOTE — ED Notes (Signed)
Pt given discharge instructions and pre-pack of Hydrocodone 6 tabs patient verified contents. States he did not need a knee immobilizer states he had some at home of the same type.

## 2016-04-15 MED FILL — Hydrocodone-Acetaminophen Tab 5-325 MG: ORAL | Qty: 6 | Status: AC

## 2016-04-18 ENCOUNTER — Telehealth: Payer: Self-pay | Admitting: Orthopedic Surgery

## 2016-04-22 ENCOUNTER — Telehealth: Payer: Self-pay | Admitting: Orthopedic Surgery

## 2016-04-22 NOTE — Telephone Encounter (Signed)
I spoke to Matthew Dougherty and told him that I have spoken to Dr. Aline Brochure.  He has directed that the patient should continue seeing  Dr. Dorris Fetch and get clearance for the knee surgery.  I have told Mr. Lindenmeyer to make sure that Dr. Dorris Fetch sends a report to out office once his labs have returned to normal.  He said he would do this.

## 2016-04-23 ENCOUNTER — Ambulatory Visit: Payer: Medicaid Other | Admitting: Orthopedic Surgery

## 2016-04-25 ENCOUNTER — Telehealth: Payer: Self-pay | Admitting: Internal Medicine

## 2016-04-25 NOTE — Telephone Encounter (Signed)
It is 100% but this value is misleading and cannot be interpreted without it's component parts. I would be happy to go over this in more detail   - any other questions/ issues re this study  need to be addressed then (and not over the phone)

## 2016-04-25 NOTE — Telephone Encounter (Signed)
Pt wants to know what his total lung capacity percentage is based on his last PFT.  Please advise Dr Melvyn Novas. Thanks.

## 2016-04-26 NOTE — Telephone Encounter (Signed)
Spoke with the pt and notified of recs per MW  He verbalized understanding and nothing further needed 

## 2016-05-08 ENCOUNTER — Other Ambulatory Visit: Payer: Self-pay | Admitting: "Endocrinology

## 2016-05-13 ENCOUNTER — Other Ambulatory Visit: Payer: Self-pay | Admitting: "Endocrinology

## 2016-05-14 LAB — COMPREHENSIVE METABOLIC PANEL
ALBUMIN: 4.1 g/dL (ref 3.5–5.5)
ALK PHOS: 86 IU/L (ref 39–117)
ALT: 28 IU/L (ref 0–44)
AST: 15 IU/L (ref 0–40)
Albumin/Globulin Ratio: 1.6 (ref 1.2–2.2)
BILIRUBIN TOTAL: 0.4 mg/dL (ref 0.0–1.2)
BUN/Creatinine Ratio: 10 (ref 9–20)
BUN: 10 mg/dL (ref 6–24)
CO2: 19 mmol/L (ref 18–29)
Calcium: 9.3 mg/dL (ref 8.7–10.2)
Chloride: 96 mmol/L (ref 96–106)
Creatinine, Ser: 1.04 mg/dL (ref 0.76–1.27)
GFR calc Af Amer: 103 mL/min/{1.73_m2} (ref >59)
GFR calc non Af Amer: 89 mL/min/{1.73_m2} (ref >59)
GLOBULIN, TOTAL: 2.5 g/dL (ref 1.5–4.5)
GLUCOSE: 210 mg/dL — AB (ref 65–99)
POTASSIUM: 4.7 mmol/L (ref 3.5–5.2)
Sodium: 138 mmol/L (ref 134–144)
Total Protein: 6.6 g/dL (ref 6.0–8.5)

## 2016-05-14 LAB — T4, FREE: FREE T4: 1.12 ng/dL (ref 0.82–1.77)

## 2016-05-14 LAB — HGB A1C W/O EAG: Hgb A1c MFr Bld: 9.5 % — ABNORMAL HIGH (ref 4.8–5.6)

## 2016-05-14 LAB — MICROALBUMIN / CREATININE URINE RATIO
CREATININE, UR: 181.1 mg/dL
MICROALBUM., U, RANDOM: 1485.2 ug/mL
Microalb/Creat Ratio: 820.1 mg/g creat — ABNORMAL HIGH (ref 0.0–30.0)

## 2016-05-14 LAB — LIPID PANEL W/O CHOL/HDL RATIO
Cholesterol, Total: 201 mg/dL — ABNORMAL HIGH (ref 100–199)
HDL: 34 mg/dL — AB (ref >39)
LDL CALC: 106 mg/dL — AB (ref 0–99)
Triglycerides: 305 mg/dL — ABNORMAL HIGH (ref 0–149)
VLDL CHOLESTEROL CAL: 61 mg/dL — AB (ref 5–40)

## 2016-05-14 LAB — TSH: TSH: 3.06 u[IU]/mL (ref 0.450–4.500)

## 2016-05-16 ENCOUNTER — Ambulatory Visit: Payer: Medicaid Other | Admitting: "Endocrinology

## 2016-05-16 ENCOUNTER — Ambulatory Visit (INDEPENDENT_AMBULATORY_CARE_PROVIDER_SITE_OTHER): Payer: Medicaid Other | Admitting: "Endocrinology

## 2016-05-16 ENCOUNTER — Encounter: Payer: Self-pay | Admitting: "Endocrinology

## 2016-05-16 VITALS — BP 110/75 | HR 82 | Ht 71.0 in | Wt 241.0 lb

## 2016-05-16 DIAGNOSIS — Z9119 Patient's noncompliance with other medical treatment and regimen: Secondary | ICD-10-CM | POA: Diagnosis not present

## 2016-05-16 DIAGNOSIS — F17210 Nicotine dependence, cigarettes, uncomplicated: Secondary | ICD-10-CM | POA: Diagnosis not present

## 2016-05-16 DIAGNOSIS — IMO0002 Reserved for concepts with insufficient information to code with codable children: Secondary | ICD-10-CM

## 2016-05-16 DIAGNOSIS — E1165 Type 2 diabetes mellitus with hyperglycemia: Secondary | ICD-10-CM

## 2016-05-16 DIAGNOSIS — E118 Type 2 diabetes mellitus with unspecified complications: Secondary | ICD-10-CM | POA: Diagnosis not present

## 2016-05-16 DIAGNOSIS — I10 Essential (primary) hypertension: Secondary | ICD-10-CM | POA: Diagnosis not present

## 2016-05-16 DIAGNOSIS — Z794 Long term (current) use of insulin: Secondary | ICD-10-CM

## 2016-05-16 DIAGNOSIS — Z91199 Patient's noncompliance with other medical treatment and regimen due to unspecified reason: Secondary | ICD-10-CM

## 2016-05-16 MED ORDER — INSULIN GLARGINE 100 UNIT/ML SOLOSTAR PEN: 70.0000 [IU] | PEN_INJECTOR | Freq: Every day | SUBCUTANEOUS | 2 refills | Status: DC

## 2016-05-16 NOTE — Progress Notes (Signed)
Subjective:    Patient ID: Matthew Dougherty, male    DOB: Mar 31, 1976, PCP Glenda Chroman, MD   Past Medical History:  Diagnosis Date  . Arthritis   . Asthma   . Cancer ([place])    kidney  . Chronic kidney disease    partial removal of left kidney; cancer  . Chronic knee pain   . COPD (chronic obstructive pulmonary disease) ([place])   . Depression   . Diabetes mellitus without complication ([place])   . Gout   . Hypertension   . Neuropathy ([place])   . Osteoarthritis   . Renal cancer ([place])    2010 left partial nephrectomy  . Restless leg syndrome   . Seizures ([place])    few years ago; states that he aspirated and had a seizure; no meds for seizures and none since then   Past Surgical History:  Procedure Laterality Date  . KNEE SURGERY Left    as child; removal of straight pin from under knee cap.  Marland Kitchen PARTIAL NEPHRECTOMY Left   . pinched nerves in back    . SEPTOPLASTY     Social History   Social History  . Marital status: Unknown    Spouse name: N/A  . Number of children: N/A  . Years of education: N/A   Social History Main Topics  . Smoking status: Current Every Day Smoker    Packs/day: 1.00    Years: 31.00    Types: Cigarettes  . Smokeless tobacco: Never Used  . Alcohol use Yes     Comment: occasional  . Drug use: No  . Sexual activity: Yes   Other Topics Concern  . None   Social History Narrative   Works as a Dealer, Econom[employer] Prescriptions as of 05/16/2016  Medication Sig  . ACCU-CHEK AVIVA PLUS test strip TEST BLOOD SUGAR 4 TIMES DAILY.  Marland Kitchen ACCU-CHEK AVIVA PLUS test strip TEST BLOOD SUGAR 4 TIMES DAILY.  Marland Kitchen albuterol (PROAIR HFA) 108 (90 Base) MCG/ACT inhaler Inhale 2 puffs into the lungs every 6 (six) hours as needed for wheezing or shortness of breath.  . Blood Glucose Monitoring Suppl (ACCU-CHEK AVIVA) device Use as instructed  . budesonide-formoterol (SYMBICORT) 160-4.5 MCG/ACT inhaler Inhale 2 puffs into the lungs 2 (two)  times daily.  . citalopram (CELEXA) 40 MG tablet Take 40 mg by mouth daily.  . diclofenac sodium (VOLTAREN) 1 % GEL Apply 4 g topically 4 (four) times daily.  Marland Kitchen EASY TOUCH PEN NEEDLES 31G X 8 MM MISC DEVICE TO INJECT INSULIN 4 TIMES DAILY.  . fluticasone-salmeterol (ADVAIR HFA) 115-21 MCG/ACT inhaler Inhale 2 puffs into the lungs 2 (two) times daily.  Marland Kitchen gabapentin (NEURONTIN) 300 MG capsule Take 300 mg by mouth 2 (two) times daily. Reported on 01/25/2016  . glucose blood test strip Use as instructed 4 x daily. e11.65  . HYDROcodone-acetaminophen (NORCO/VICODIN) 5-325 MG tablet Take 2 tablets by mouth every 4 (four) hours as needed.  Marland Kitchen ibuprofen (ADVIL,MOTRIN) 800 MG tablet Take 1 tablet (800 mg total) by mouth 3 (three) times daily.  . Insulin Glargine (LANTUS SOLOSTAR) 100 UNIT/ML Solostar Pen Inject 70 Units into the skin daily at 10 pm.  . metFORMIN (GLUCOPHAGE) 500 MG tablet TAKE (1) TABLET TWICE A DAY WITH MEALS.  . metoprolol succinate (TOPROL-XL) 25 MG 24 hr tablet Take 25 mg by mouth daily.  Marland Kitchen NOVOLOG FLEXPEN 100 UNIT/ML FlexPen INJECT 10 UNITS SUBCUTANEOUSLY 3 TIMES DAILY BEFORE MEALS.  Marland Kitchen  OXYGEN 2lpm with sleep only  [name]  . potassium chloride (MICRO-K) 10 MEQ CR capsule Take 10 mEq by mouth daily.  . Tiotropium Bromide Monohydrate (SPIRIVA RESPIMAT) 2.5 MCG/ACT AERS 2 puffs each am  . traMADol (ULTRAM) 50 MG tablet Take 1 tablet (50 mg total) by mouth every 6 (six) hours as needed.  . valsartan (DIOVAN) 80 MG tablet Take 1 tablet (80 mg total) by mouth daily.  Marland Kitchen zolpidem (AMBIEN) 5 MG tablet Take 5 mg by mouth at bedtime as needed for sleep.  . [DISCONTINUED] Insulin Glargine (LANTUS SOLOSTAR) 100 UNIT/ML Solostar Pen Inject 60 Units into the skin at bedtime.   No facility-administered encounter medications on file as of 05/16/2016.    ALLERGIES: Allergies  Allergen Reactions  . Penicillins    VACCINATION STATUS: Immunization History  Administered Date(s) Administered  .  Influenza,inj,Quad PF,36+ Mos 03/29/2016  . Pneumococcal-Unspecified 07/08/2012    Diabetes  He presents for his follow-up diabetic visit. He has type 2 diabetes mellitus. Onset time: He was diagnosed at approximate age of 84 years. His disease course has been improving. There are no hypoglycemic associated symptoms. Pertinent negatives for hypoglycemia include no confusion, headaches, pallor or seizures. Associated symptoms include polydipsia, polyphagia, polyuria and visual change. Pertinent negatives for diabetes include no chest pain, no fatigue and no weakness. There are no hypoglycemic complications. Symptoms are improving. Diabetic complications include peripheral neuropathy. Risk factors for coronary artery disease include diabetes mellitus, dyslipidemia, family history, hypertension, male sex, obesity, sedentary lifestyle and tobacco exposure. Current diabetic treatment includes insulin injections and oral agent (monotherapy) (He did not keep up with his appointments, ran out of most of his medications. His most recent A1c has increased to 11.6%.). He is compliant with treatment none of the time. His weight is stable. He is following a generally unhealthy diet. When asked about meal planning, he reported none. He has not had a previous visit with a dietitian (He missed his appointments with the dietitian as well.). He never participates in exercise. He monitors blood glucose at home 1-2 x per day. Blood glucose monitoring compliance is adequate. His home blood glucose trend is decreasing steadily. His breakfast blood glucose range is generally >200 mg/dl. His lunch blood glucose range is generally >200 mg/dl. His dinner blood glucose range is generally >200 mg/dl. His overall blood glucose range is >200 mg/dl. An ACE inhibitor/angiotensin II receptor blocker is being taken. Eye exam is current.  Hyperlipidemia  This is a chronic problem. The current episode started more than 1 year ago. Pertinent  negatives include no chest pain, myalgias or shortness of breath. Risk factors for coronary artery disease include family history, dyslipidemia, diabetes mellitus, hypertension, male sex, obesity and a sedentary lifestyle.  Hypertension  This is a chronic problem. The current episode started more than 1 year ago. Pertinent negatives include no chest pain, headaches, neck pain, palpitations or shortness of breath. Risk factors for coronary artery disease include diabetes mellitus, dyslipidemia, male gender, smoking/tobacco exposure, sedentary lifestyle and obesity. Past treatments include ACE inhibitors.     Review of Systems  Constitutional: Negative for chills, fatigue, fever and unexpected weight change.  HENT: Negative for dental problem, mouth sores and trouble swallowing.   Eyes: Negative for visual disturbance.  Respiratory: Negative for cough, choking, chest tightness, shortness of breath and wheezing.   Cardiovascular: Negative for chest pain, palpitations and leg swelling.  Gastrointestinal: Negative for abdominal distention, abdominal pain, constipation, diarrhea, nausea and vomiting.  Endocrine: Positive for polydipsia,  polyphagia and polyuria.  Genitourinary: Negative for dysuria, flank pain, hematuria and urgency.  Musculoskeletal: Negative for back pain, gait problem, myalgias and neck pain.  Skin: Negative for pallor, rash and wound.  Neurological: Negative for seizures, syncope, weakness, numbness and headaches.  Psychiatric/Behavioral: Negative.  Negative for confusion and dysphoric mood.    Objective:    BP 110/75   Pulse 82   Ht 5\' 11"  (1.803 m)   Wt 241 lb (109.3 kg)   BMI 33.61 kg/m   Wt Readings from Last 3 Encounters:  05/16/16 241 lb (109.3 kg)  04/13/16 239 lb (108.4 kg)  03/29/16 239 lb 9.6 oz (108.7 kg)    Physical Exam  Constitutional: He is oriented to person, place, and time. He appears well-developed. He is cooperative. No distress.  Obese,  disheveled. Has poor hygiene.  HENT:  Head: Normocephalic and atraumatic.  He has poor oral hygiene.   Eyes: EOM are normal.  Neck: Normal range of motion. Neck supple. No tracheal deviation present. No thyromegaly present.  Cardiovascular: Normal rate, S1 normal, S2 normal and normal heart sounds.  Exam reveals no gallop.   No murmur heard. Pulses:      Dorsalis pedis pulses are 1+ on the right side, and 1+ on the left side.       Posterior tibial pulses are 1+ on the right side, and 1+ on the left side.  Pulmonary/Chest: Breath sounds normal. No respiratory distress. He has no wheezes.  Abdominal: Soft. Bowel sounds are normal. He exhibits no distension. There is no tenderness. There is no guarding and no CVA tenderness.  Musculoskeletal: He exhibits no edema.       Right shoulder: He exhibits no swelling and no deformity.  Neurological: He is alert and oriented to person, place, and time. He has normal strength and normal reflexes. No cranial nerve deficit or sensory deficit. Gait normal.  Skin: Skin is warm and dry. No rash noted. No cyanosis. Nails show no clubbing.  Psychiatric: His speech is normal. Cognition and memory are normal.  Noncompliant/reluctant affect.     Recent Results (from the past 2160 hour(s))  Pulmonary function test     Status: None   Collection Time: 03/29/16 10:02 AM  Result Value Ref Range   FVC-Pre 2.62 L   FVC-%Pred-Pre 48 %   FVC-Post 3.30 L   FVC-%Pred-Post 60 %   FVC-%Change-Post 26 %   FEV1-Pre 1.49 L   FEV1-%Pred-Pre 34 %   FEV1-Post 2.07 L   FEV1-%Pred-Post 48 %   FEV1-%Change-Post 39 %   FEV6-Pre 2.44 L   FEV6-%Pred-Pre 46 %   FEV6-Post 3.22 L   FEV6-%Pred-Post 61 %   FEV6-%Change-Post 32 %   Pre FEV1/FVC ratio 57 %   FEV1FVC-%Pred-Pre 71 %   Post FEV1/FVC ratio 63 %   FEV1FVC-%Change-Post 10 %   Pre FEV6/FVC Ratio 94 %   FEV6FVC-%Pred-Pre 96 %   Post FEV6/FVC ratio 98 %   FEV6FVC-%Pred-Post 100 %   FEV6FVC-%Change-Post 4 %    FEF 25-75 Pre 0.60 L/sec   FEF2575-%Pred-Pre 15 %   FEF 25-75 Post 1.81 L/sec   FEF2575-%Pred-Post 45 %   FEF2575-%Change-Post 202 %   RV 4.47 L   RV % pred 232 %   TLC 7.20 L   TLC % pred 100 %   DLCO unc 22.35 ml/min/mmHg   DLCO unc % pred 66 %   DL/VA 4.83 ml/min/mmHg/L   DL/VA % pred 102 %  Comprehensive metabolic panel  Status: Abnormal   Collection Time: 05/13/16 10:39 AM  Result Value Ref Range   Glucose 210 (H) 65 - 99 mg/dL   BUN 10 6 - 24 mg/dL   Creatinine, Ser 1.04 0.76 - 1.27 mg/dL   GFR calc non Af Amer 89 >59 mL/min/1.73   GFR calc Af Amer 103 >59 mL/min/1.73   BUN/Creatinine Ratio 10 9 - 20   Sodium 138 134 - 144 mmol/L   Potassium 4.7 3.5 - 5.2 mmol/L   Chloride 96 96 - 106 mmol/L   CO2 19 18 - 29 mmol/L   Calcium 9.3 8.7 - 10.2 mg/dL   Total Protein 6.6 6.0 - 8.5 g/dL   Albumin 4.1 3.5 - 5.5 g/dL   Globulin, Total 2.5 1.5 - 4.5 g/dL   Albumin/Globulin Ratio 1.6 1.2 - 2.2   Bilirubin Total 0.4 0.0 - 1.2 mg/dL   Alkaline Phosphatase 86 39 - 117 IU/L   AST 15 0 - 40 IU/L   ALT 28 0 - 44 IU/L  Lipid Panel w/o Chol/HDL Ratio     Status: Abnormal   Collection Time: 05/13/16 10:39 AM  Result Value Ref Range   Cholesterol, Total 201 (H) 100 - 199 mg/dL   Triglycerides 305 (H) 0 - 149 mg/dL   HDL 34 (L) >39 mg/dL   VLDL Cholesterol Cal 61 (H) 5 - 40 mg/dL   LDL Calculated 106 (H) 0 - 99 mg/dL  Microalbumin / creatinine urine ratio     Status: Abnormal   Collection Time: 05/13/16 10:39 AM  Result Value Ref Range   Creatinine, Urine 181.1 Not Estab. mg/dL   Microalbum.,Leonard Schwartz [id number] Not Estab. ug/mL    Comment: Results confirmed on dilution.    Microalb/Creat Ratio 820.1 (H) 0.0 - 30.0 mg/g creat  Hgb A1c w/o eAG     Status: Abnormal   Collection Time: 05/13/16 10:39 AM  Result Value Ref Range   Hgb A1c MFr Bld 9.5 (H) 4.8 - 5.6 %    Comment:          Pre-diabetes: 5.7 - 6.4          Diabetes: >6.4          Glycemic control for adults with  diabetes: <7.0   T4, free     Status: None   Collection Time: 05/13/16 10:39 AM  Result Value Ref Range   Free T4 1.12 0.82 - 1.77 ng/dL  TSH     Status: None   Collection Time: 05/13/16 10:39 AM  Result Value Ref Range   TSH 3.060 0.450 - 4.500 uIU/mL     Assessment & Plan:   1. Uncontrolled type 2 diabetes mellitus with complication, with long-term current use of insulin ([place])  His diabetes is  complicated by noncompliance/nonadherence, peripheral neuropathy, obesity, and patient remains at a high risk for more acute and chronic complications of diabetes which include CAD, CVA, CKD, retinopathy, and neuropathy. These are all discussed in detail with the patient.  Patient came with  Still inadequate glucose profile,  And missed at least a third of his insulin opportunities. His A1c has improved to 9.5% from 11.6%.     Glucose logs and insulin administration records pertaining to this visit,  to be scanned into patient's records.  Recent labs reviewed.   - I have re-counseled the patient on diet management and weight loss  by adopting a carbohydrate restricted / protein rich  Diet.  - Suggestion is made for patient to avoid simple carbohydrates  from their diet including Cakes , Desserts, Ice Cream,  Soda (  diet and regular) , Sweet Tea , Candies,  Chips, Cookies, Artificial Sweeteners,   and "Sugar-free" Products .  This will help patient to have stable blood glucose profile and potentially avoid unintended  Weight gain.  - Patient is advised to stick to a routine mealtimes to eat 3 meals  a day and avoid unnecessary snacks ( to snack only to correct hypoglycemia).  - The patient  has been  scheduled with Jearld Fenton, RDN, CDE for individualized DM education.  - I have approached patient with the following individualized plan to manage diabetes and patient agrees.  - He came with a log showing  inadequate blood glucose readings, averaging 215 over the last 14 days. - I  approached him for better engagement because he needs basal/bolus insulin. - I will increase his basal insulin Lantus to 70 units QHS, continue NovoLog 10 units 3 times a day before meals for pre-meal blood glucose between 90 and 150 minute grams per deciliter plus correction for above 150 mg/dL, associated with strict monitoring of glucose  AC and HS.  -Adjustment parameters are given for hypo and hyperglycemia in writing. -Patient is encouraged to call clinic for blood glucose levels less than 70 or above 300 mg /dl. - I will continue metformin 500 mg by mouth twice a day, therapeutically suitable for patient. - He will drop off his logs in 2 weeks and if his average blood glucoses below 200 he will have clearance for his shoulder shoulder surgery. -  He has history of left partial nephrectomy for reported renal cell carcinoma hence he only has right kidney.  - Patient specific target  for A1c; LDL, HDL, Triglycerides, and  Waist Circumference were discussed in detail.  2) BP/HTN: Controlled. Continue current medications including ACEI/ARB. 3) Lipids/HPL: Control unknown, he is not on statins. I will obtain fasting lipid panel on subsequent visit. 4)  Weight/Diet: CDE consult in progress, exercise, and carbohydrates information provided.  5) Chronic Care/Health Maintenance:  -Patient is on ACEI/ARB and encouraged to continue to follow up with Ophthalmology, Podiatrist at least yearly or according to recommendations, and advised to quit smoking. I have recommended yearly flu vaccine and pneumonia vaccination at least every 5 years; moderate intensity exercise for up to 150 minutes weekly; and  sleep for at least 7 hours a day.  - 25 minutes of time was spent on the care of this patient , 50% of which was applied for counseling on diabetes complications and their preventions.  - I advised patient to maintain close follow up with Glenda Chroman, MD for primary care needs.  Patient is asked to  bring meter and  blood glucose logs during their next visit.   Follow up plan: -Return in about 3 months (around 08/16/2016) for follow up with pre-visit labs, meter, and logs.  Glade Lloyd, MD Phone: [phone]  Fax: [phone]   05/16/2016, 4:34 PM

## 2016-05-16 NOTE — Patient Instructions (Signed)

## 2016-06-07 ENCOUNTER — Other Ambulatory Visit: Payer: Self-pay | Admitting: "Endocrinology

## 2016-06-11 ENCOUNTER — Other Ambulatory Visit: Payer: Self-pay | Admitting: "Endocrinology

## 2016-06-18 ENCOUNTER — Other Ambulatory Visit: Payer: Self-pay | Admitting: Pain Medicine

## 2016-06-18 DIAGNOSIS — M5440 Lumbago with sciatica, unspecified side: Principal | ICD-10-CM

## 2016-06-18 DIAGNOSIS — M4327 Fusion of spine, lumbosacral region: Secondary | ICD-10-CM

## 2016-06-18 DIAGNOSIS — G8929 Other chronic pain: Secondary | ICD-10-CM

## 2016-06-25 ENCOUNTER — Ambulatory Visit (HOSPITAL_COMMUNITY): Payer: Medicaid Other

## 2016-06-26 ENCOUNTER — Ambulatory Visit (HOSPITAL_COMMUNITY)
Admission: RE | Admit: 2016-06-26 | Discharge: 2016-06-26 | Disposition: A | Discharge status: Home / Self Care | Payer: Medicaid Other | Source: Ambulatory Visit | Attending: Pain Medicine | Admitting: Pain Medicine

## 2016-06-26 DIAGNOSIS — G8929 Other chronic pain: Secondary | ICD-10-CM

## 2016-06-26 DIAGNOSIS — R202 Paresthesia of skin: Secondary | ICD-10-CM | POA: Diagnosis not present

## 2016-06-26 DIAGNOSIS — Z85528 Personal history of other malignant neoplasm of kidney: Secondary | ICD-10-CM | POA: Insufficient documentation

## 2016-06-26 DIAGNOSIS — R200 Anesthesia of skin: Secondary | ICD-10-CM | POA: Diagnosis not present

## 2016-06-26 DIAGNOSIS — M5440 Lumbago with sciatica, unspecified side: Secondary | ICD-10-CM | POA: Insufficient documentation

## 2016-06-26 DIAGNOSIS — E279 Disorder of adrenal gland, unspecified: Secondary | ICD-10-CM | POA: Diagnosis not present

## 2016-06-26 DIAGNOSIS — M4327 Fusion of spine, lumbosacral region: Secondary | ICD-10-CM

## 2016-06-28 ENCOUNTER — Encounter: Payer: Self-pay | Admitting: Internal Medicine

## 2016-06-28 ENCOUNTER — Ambulatory Visit (INDEPENDENT_AMBULATORY_CARE_PROVIDER_SITE_OTHER): Payer: Medicaid Other | Admitting: Internal Medicine

## 2016-06-28 ENCOUNTER — Ambulatory Visit (INDEPENDENT_AMBULATORY_CARE_PROVIDER_SITE_OTHER)
Admission: RE | Admit: 2016-06-28 | Discharge: 2016-06-28 | Disposition: A | Discharge status: Home / Self Care | Payer: Medicaid Other | Source: Ambulatory Visit | Attending: Internal Medicine | Admitting: Internal Medicine

## 2016-06-28 VITALS — BP 114/74 | HR 103 | Ht 71.0 in | Wt 249.0 lb

## 2016-06-28 DIAGNOSIS — F17210 Nicotine dependence, cigarettes, uncomplicated: Secondary | ICD-10-CM | POA: Diagnosis not present

## 2016-06-28 DIAGNOSIS — J449 Chronic obstructive pulmonary disease, unspecified: Secondary | ICD-10-CM

## 2016-06-28 MED ORDER — AZITHROMYCIN 250 MG PO TABS: ORAL_TABLET | ORAL | 0 refills | Status: DC

## 2016-06-28 MED ORDER — PREDNISONE 10 MG PO TABS: ORAL_TABLET | ORAL | 0 refills | Status: DC

## 2016-06-28 NOTE — Patient Instructions (Addendum)
Plan A = Automatic = symbicort 160 Take 2 puffs first thing in am and then another 2 puffs about 12 hours later.                                      Spiriva respimat 2 pffs each am    Plan B = Backup Only use your albuterol (Proair)  as a rescue medication to be used if you can't catch your breath by resting or doing a relaxed purse lip breathing pattern.  - The less you use it, the better it will work when you need it. - Ok to use the inhaler up to 2 puffs  every 4 hours if you must but call for appointment if use goes up over your usual need - Don't leave home without it !!  (think of it like the spare tire for your car)   Plan C = Crisis - only use your albuterol nebulizer if you first try Plan B and it fails to help > ok to use the nebulizer up to every 4 hours but if start needing it regularly call for immediate appointment  Prescriptions called in zpak Prednisone 10 mg take  4 each am x 2 days,   2 each am x 2 days,  1 each am x 2 days and stop   Please remember to go to the  x-ray department downstairs for your tests - we will call you with the results when they are available.   The key is to stop smoking completely before smoking completely stops you!      Please schedule a follow up visit in 3 months but call sooner if needed

## 2016-06-28 NOTE — Progress Notes (Signed)
Subjective:    Patient ID: Matthew Dougherty, male    DOB: March 31, 1976,    MRN: [id number]    Brief patient profile:  40 yowm active smoker  from [place] good ex tol as teenager though had dx of asthma starting around age 42-14 and able to play to football/ cross country and had inhaler but didn't use it much until around 2010 need for saba increased and around 2014 placed on maint rx with advair but not better with criteria for severe copd in 04/2014 referred to pulmonary clinic 02/16/2016 by Dr Woody Seller   History of Present Illness  02/16/2016 1st [facility] office visit/ Matthew Dougherty  maint rx advair hfa  spiriva dpi / acei Chief Complaint  Patient presents with  . Pulmonary Consult    Referred by Dr. Woody Seller. Pt states dxed with COPD in 2015. He c/o increased cough and SOB over the past year. He states that some days he gets winded just walking around his house. His cough is prod with clear sputum and tends to be worse first thing in the am and then at night. He states that his symptoms can flare when exposed to certain smells and also with weather change.   placed on acei x 10 y and cough worse x around 2 years prior to OV  - no classic pleuritic or ex cp Doe = MMRC4  = sob if tries to leave home or while getting dressed   Some better with saba rec Plan A = Automatic = Advair 115 Take 2 puffs first thing in am and then another 2 puffs about 12 hours later.                                      Spiriva respimat 2 pffs each am  Plan B = Backup Only use your albuterol as a rescue medication to be used if you can't catch your breath by resting or doing a relaxed purse lip breathing pattern.  Stop lisinopril and start diovan (valsartan) 80 mg daily  The key is to stop smoking completely before smoking completely stops you - at least cut down if you can.       03/29/2016  f/u ov/Matthew Dougherty re: COPD III/ AB /still smoking on advair /spiriva respimat still actively smoking  Chief Complaint  Patient  presents with  . Follow-up    PFT's done today. He still smokes 1 ppd. He states that he is interested in quitting. His breathing is unchanged since the last visit.   cough is better, amble to walk full wm = MMRC2 = can't walk a nl pace on a flat grade s sob but does fine slow and flat  Only using albuterol when over does it  rec Stop advair and symbicort 160 Take 2 puffs first thing in am and then another 2 puffs about 12 hours later.  Continue spiriva 2puff each am  Call me if problems acquiring spiriva  The key is to stop smoking completely before smoking completely stops you - it's not too late     06/28/2016  f/u ov/Matthew Dougherty re:   Copd III / AB symb/spiriva / still actively smoking  Chief Complaint  Patient presents with  . Follow-up    Pt c/o increased SOB. He has been having sinus congestion for the past 2 wks. He also c/o cough with white sputum.  He is using albuterol inhaler at  least once per day on average.   day and night cough some yellow in am assoc with nasal congestion Using saba up to 4 x daily / occ neb  Doe still MMRC 2    No obvious patterns in day to day or daytime variability or assoc purulent sputum or mucus plugs or hemoptysis or cp or chest tightness, subjective wheeze or overt  hb symptoms. No unusual exp hx or h/o childhood pna/ asthma or knowledge of premature birth.  Sleeping ok without nocturnal  or early am exacerbation  of respiratory  c/o's or need for noct saba. Also denies any obvious fluctuation of symptoms with weather or environmental changes or other aggravating or alleviating factors except as outlined above   Current Medications, Allergies, Complete Past Medical History, Past Surgical History, Family History, and Social History were reviewed in [employer] record.  ROS  The following are not active complaints unless bolded sore throat, dysphagia, dental problems, itching, sneezing,  nasal congestion or excess/ purulent  secretions, ear ache,   fever, chills, sweats, unintended wt loss, classically pleuritic or exertional cp,  orthopnea pnd or leg swelling, presyncope, palpitations, abdominal pain, anorexia, nausea, vomiting, diarrhea  or change in bowel or bladder habits, change in stools or urine, dysuria,hematuria,  rash, arthralgias, visual complaints, headache, numbness, weakness or ataxia or problems with walking or coordination,  change in mood/affect or memory.               Objective:   Physical Exam  06/28/2016     249  03/29/2016       239  02/16/16 226 lb (102.5 kg)  02/08/16 229 lb 6 oz (104 kg)  01/25/16 222 lb (100.7 kg)    Vital signs reviewed - 02 sat 95% RA on arrival   HEENT: nl dentition, turbinates, and oropharynx. Nl external ear canals without cough reflex   NECK :  without JVD/Nodes/TM/ nl carotid upstrokes bilaterally   LUNGS: no acc muscle use,  Nl contour chest  With insp/exp rhonchi bilaterally worse L base   CV:  RRR  no s3 or murmur or increase in P2, no edema   ABD:  soft and nontender with nl inspiratory excursion in the supine position. No bruits or organomegaly, bowel sounds nl  MS:  Nl gait/ ext warm without deformities, calf tenderness, cyanosis or clubbing No obvious joint restrictions   SKIN: warm and dry without lesions    NEURO:  alert, approp, nl sensorium with  no motor deficits    CXR PA and Lateral:   06/28/2016 :    I personally reviewed images and agree with radiology impression as follows:   Low lung volumes, otherwise no active cardiopulmonary disease.              Assessment & Plan:

## 2016-06-28 NOTE — Progress Notes (Signed)
Spoke with pt and notified of results per Dr. Wert. Pt verbalized understanding and denied any questions. 

## 2016-07-01 NOTE — Assessment & Plan Note (Signed)
Body mass index is 34.73 till trending up   Lab Results  Component Value Date   TSH 3.060 05/13/2016     Contributing to gerd tendency/ doe/reviewed the need and the process to achieve and maintain neg calorie balance > defer f/u primary care including intermittently monitoring thyroid status

## 2016-07-01 NOTE — Assessment & Plan Note (Addendum)
PFTs 05/06/14   FEV1  1.12 (25%) ratio 65 with 23% improvement p saba in SVC and dlco 62% corrects to 102%  FEV1/SVC = 46%   - 02/16/2016 changed spiriva to respimat/ d/c'd acei  - alpha one screen 02/16/2016 >  MM - PFT's  03/29/2016  FEV1 2.07 (48 % ) ratio 63  p 39 % improvement from saba p nothing prior to study with DLCO  66 % corrects to 102  % for alv volume   - 03/29/2016  After extensive coaching HFA effectiveness =    90% try symb 160 2bid instead of advair    Still poorly controlled ? Adherent/ still smoking with mild flair of AB > rec zpak/ pred x 6 days only   I had an extended discussion with the patient reviewing all relevant studies completed to date and  lasting 15 to 20 minutes of a 25 minute visit    Each maintenance medication was reviewed in detail including most importantly the difference between maintenance and prns and under what circumstances the prns are to be triggered using an action plan format that is not reflected in the computer generated alphabetically organized AVS.    Please see AVS for unique instructions that I personally wrote and verbalized to the the pt in detail and then reviewed with pt  by my nurse highlighting any  changes in therapy recommended at today's visit to their plan of care.

## 2016-07-01 NOTE — Assessment & Plan Note (Signed)
Discussed the risks and costs (both direct and indirect)  of smoking relative to the benefits of quitting but patient unwilling to commit at this point to a specific quit date.    Offered to help with quitting by use of chantix or referral to our Quit Smart program when the patient is ready.  

## 2016-07-12 ENCOUNTER — Other Ambulatory Visit: Payer: Self-pay | Admitting: "Endocrinology

## 2016-08-07 ENCOUNTER — Other Ambulatory Visit: Payer: Self-pay | Admitting: "Endocrinology

## 2016-08-15 ENCOUNTER — Other Ambulatory Visit: Payer: Self-pay | Admitting: "Endocrinology

## 2016-08-16 IMAGING — DX DG CHEST 2V
2 series · 2 of 2 positions shown · non-contrast
Comparison: 03/19/2015

CLINICAL DATA: COPD, cough, congestion

EXAM:
CHEST  2 VIEW

[chest pa]
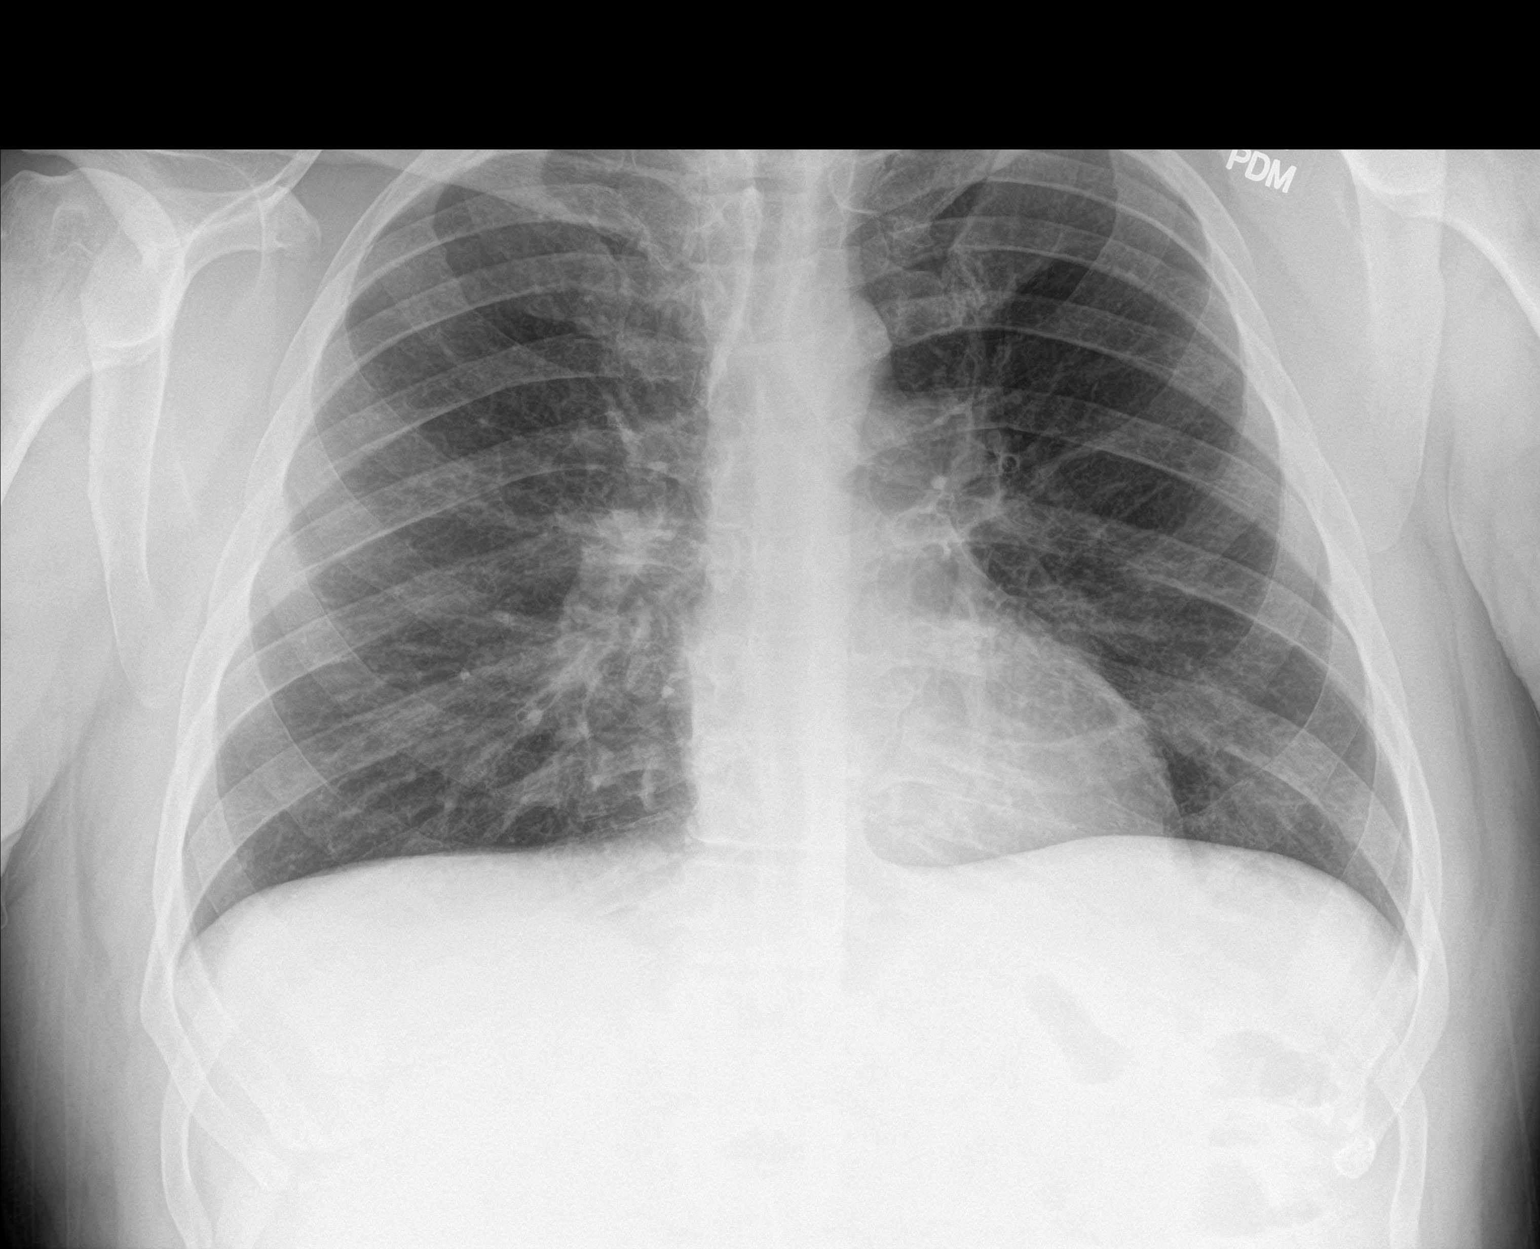

[chest lat]
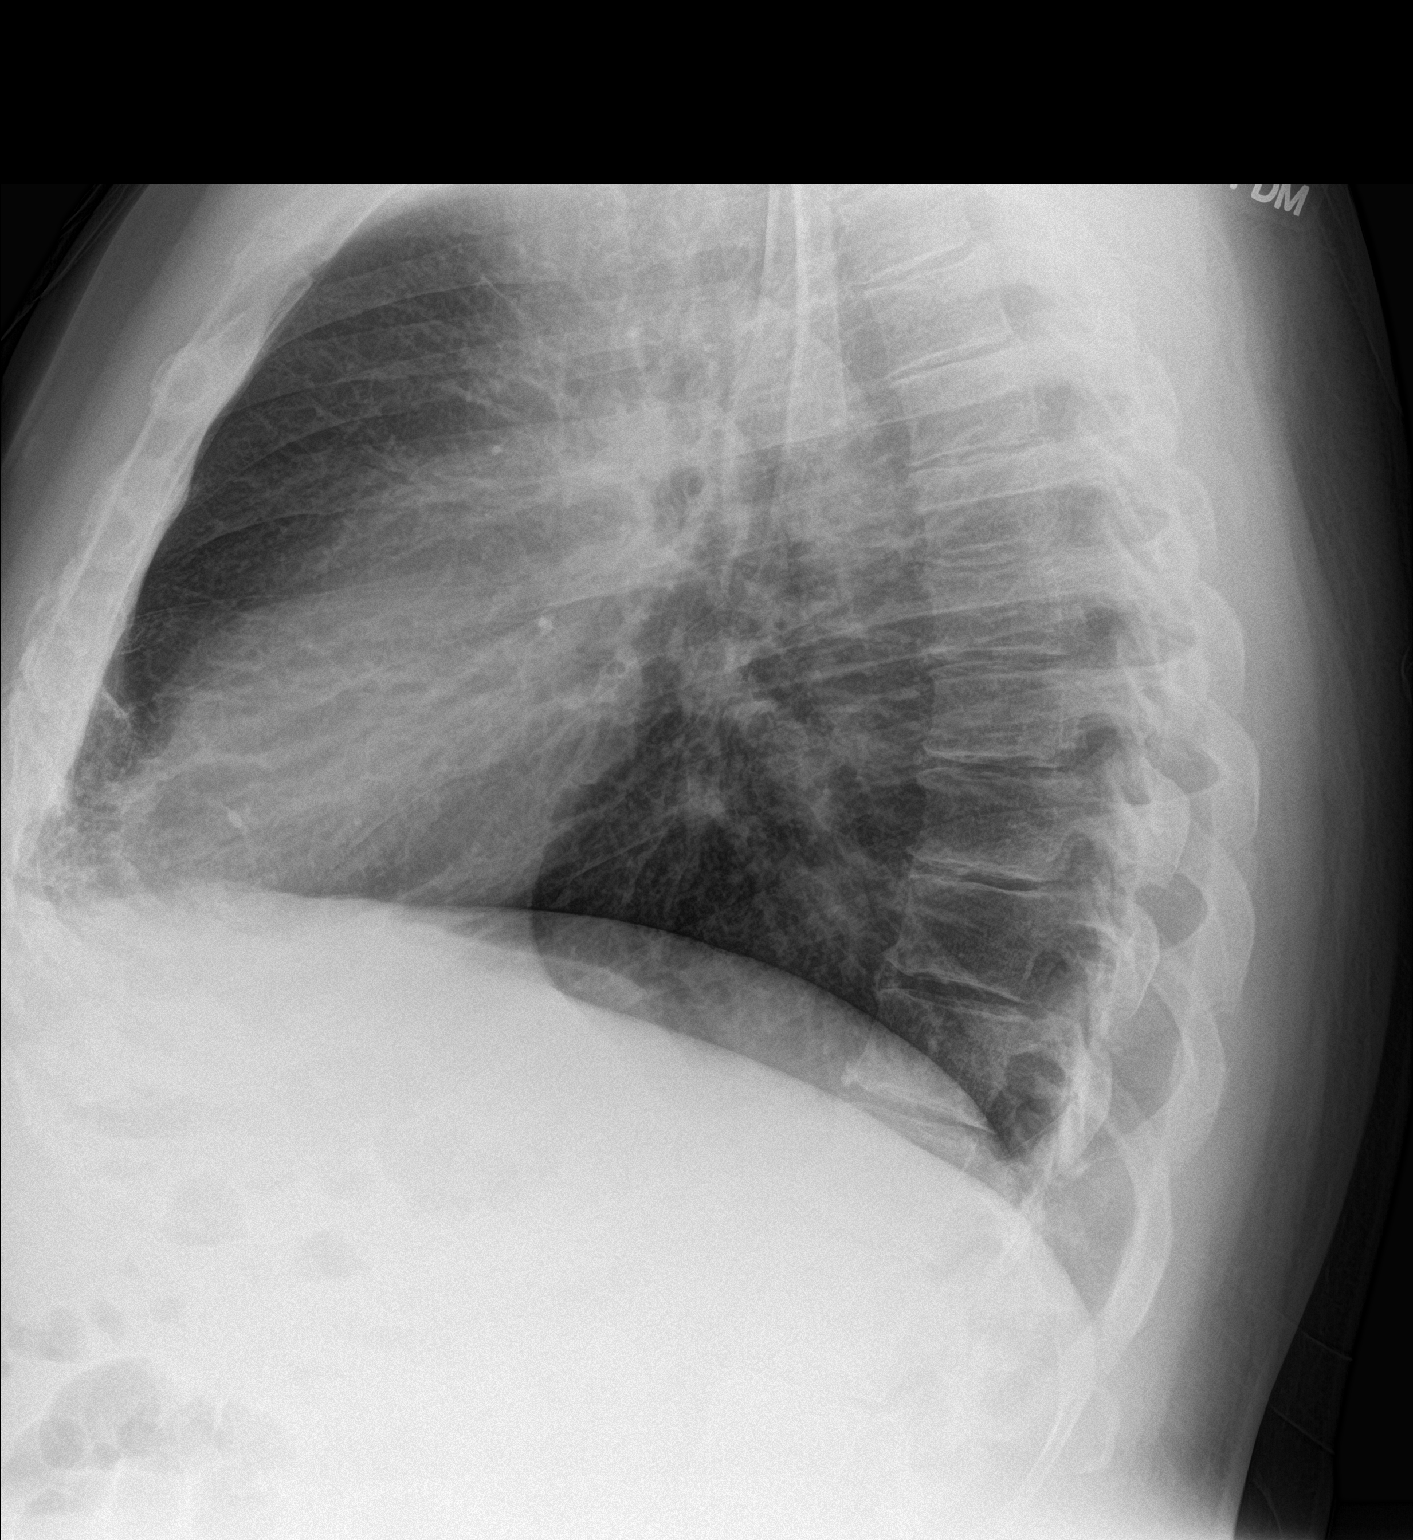

[2 of 2 positions shown; findings below may reference images not displayed]

FINDINGS: Cardiomediastinal silhouette is stable. No acute infiltrate or
pleural effusion. No pulmonary edema. Mild hyperinflation. Mild
degenerative change thoracic spine. Minimal perihilar bronchitic
changes.
IMPRESSION: Mild hyperinflation. No infiltrate or pulmonary edema. Minimal
perihilar bronchitic changes.

## 2016-08-22 ENCOUNTER — Ambulatory Visit: Payer: Medicaid Other | Admitting: "Endocrinology

## 2016-09-09 ENCOUNTER — Telehealth: Payer: Self-pay | Admitting: Internal Medicine

## 2016-09-09 DIAGNOSIS — J449 Chronic obstructive pulmonary disease, unspecified: Secondary | ICD-10-CM

## 2016-09-09 MED ORDER — PREDNISONE 10 MG PO TABS: ORAL_TABLET | ORAL | 0 refills | Status: DC

## 2016-09-09 MED ORDER — DOXYCYCLINE HYCLATE 100 MG PO TABS: 100.0000 mg | ORAL_TABLET | Freq: Two times a day (BID) | ORAL | 0 refills | Status: DC

## 2016-09-09 NOTE — Telephone Encounter (Signed)
Prednisone 10 mg take  4 each am x 2 days,   2 each am x 2 days,  1 each am x 2 days and stop  Doxy 100 mg bid x 7 days  Ov if not better

## 2016-09-09 NOTE — Telephone Encounter (Signed)
Spoke with pt. He is aware of MW's recommendations. Rxs have been sent in. Nothing further was needed. 

## 2016-09-09 NOTE — Telephone Encounter (Signed)
Pt having cough, SOB and congestion x 2 weeks. Pt getting up thick white-yellow mucus at times. Pt states that majority of the time he will cough up thick white mucus that looks like the consistency of [place] Glue.  Pt was seen in ED x 3 weeks ago and was given Zpak, Tessalon Perles and Prednisone taper. Pt states that his symptoms did not seem to work 100% to improve symptoms. Pt states that the abx did not seem strong enough.   Pt lost his Medicaid insurance and is unable to come in for an appt.  Requests that medication be called into [facility] in [place].  Please advise Dr Melvyn Novas. Thanks.

## 2016-09-26 ENCOUNTER — Ambulatory Visit: Payer: Medicaid Other | Admitting: Internal Medicine

## 2016-10-01 ENCOUNTER — Other Ambulatory Visit: Payer: Self-pay | Admitting: "Endocrinology

## 2016-10-30 ENCOUNTER — Other Ambulatory Visit: Payer: Self-pay | Admitting: "Endocrinology

## 2016-11-25 ENCOUNTER — Other Ambulatory Visit: Payer: Self-pay | Admitting: "Endocrinology

## 2016-11-28 ENCOUNTER — Telehealth: Payer: Self-pay | Admitting: "Endocrinology

## 2016-11-28 DIAGNOSIS — E118 Type 2 diabetes mellitus with unspecified complications: Principal | ICD-10-CM

## 2016-11-28 DIAGNOSIS — E1165 Type 2 diabetes mellitus with hyperglycemia: Secondary | ICD-10-CM

## 2016-11-28 MED ORDER — GLUCOSE BLOOD VI STRP: ORAL_STRIP | 5 refills | Status: DC

## 2016-11-28 NOTE — Addendum Note (Signed)
Addended by: Lavell Luster A on: 11/28/2016 11:24 AM   Modules accepted: Orders

## 2016-11-28 NOTE — Telephone Encounter (Signed)
Javell is needing a new Rx sent to [facility] for Conture Next Test Strips due to insurance changes

## 2016-12-10 LAB — HEMOGLOBIN A1C: Hemoglobin A1C: 10.6

## 2016-12-17 ENCOUNTER — Encounter: Payer: Self-pay | Admitting: "Endocrinology

## 2016-12-17 ENCOUNTER — Ambulatory Visit (INDEPENDENT_AMBULATORY_CARE_PROVIDER_SITE_OTHER): Payer: BLUE CROSS/BLUE SHIELD | Admitting: "Endocrinology

## 2016-12-17 VITALS — BP 120/83 | HR 92 | Ht 71.0 in | Wt 237.0 lb

## 2016-12-17 DIAGNOSIS — E118 Type 2 diabetes mellitus with unspecified complications: Secondary | ICD-10-CM

## 2016-12-17 DIAGNOSIS — I10 Essential (primary) hypertension: Secondary | ICD-10-CM

## 2016-12-17 DIAGNOSIS — E1165 Type 2 diabetes mellitus with hyperglycemia: Secondary | ICD-10-CM

## 2016-12-17 DIAGNOSIS — Z9119 Patient's noncompliance with other medical treatment and regimen: Secondary | ICD-10-CM | POA: Diagnosis not present

## 2016-12-17 DIAGNOSIS — Z91199 Patient's noncompliance with other medical treatment and regimen due to unspecified reason: Secondary | ICD-10-CM

## 2016-12-17 DIAGNOSIS — Z794 Long term (current) use of insulin: Secondary | ICD-10-CM

## 2016-12-17 DIAGNOSIS — IMO0002 Reserved for concepts with insufficient information to code with codable children: Secondary | ICD-10-CM

## 2016-12-17 DIAGNOSIS — F17210 Nicotine dependence, cigarettes, uncomplicated: Secondary | ICD-10-CM

## 2016-12-17 MED ORDER — INSULIN ASPART 100 UNIT/ML FLEXPEN: PEN_INJECTOR | SUBCUTANEOUS | 2 refills | Status: DC

## 2016-12-17 NOTE — Patient Instructions (Signed)

## 2016-12-17 NOTE — Progress Notes (Signed)
Subjective:    Patient ID: Matthew Dougherty, male    DOB: [birth year], PCP Glenda Chroman, MD   Past Medical History:  Diagnosis Date  . Arthritis   . Asthma   . Cancer ([place])    kidney  . Chronic kidney disease    partial removal of left kidney; cancer  . Chronic knee pain   . COPD (chronic obstructive pulmonary disease) ([place])   . Depression   . Diabetes mellitus without complication ([place])   . Gout   . Hypertension   . Neuropathy   . Osteoarthritis   . Renal cancer ([place])    2010 left partial nephrectomy  . Restless leg syndrome   . Seizures ([place])    few years ago; states that he aspirated and had a seizure; no meds for seizures and none since then   Past Surgical History:  Procedure Laterality Date  . KNEE SURGERY Left    as child; removal of straight pin from under knee cap.  Marland Kitchen PARTIAL NEPHRECTOMY Left   . pinched nerves in back    . SEPTOPLASTY     Social History   Social History  . Marital status: Unknown    Spouse name: N/A  . Number of children: N/A  . Years of education: N/A   Social History Main Topics  . Smoking status: Current Every Day Smoker    Packs/day: 1.00    Years: 31.00    Types: Cigarettes  . Smokeless tobacco: Never Used  . Alcohol use Yes     Comment: occasional  . Drug use: No  . Sexual activity: Yes   Other Topics Concern  . None   Social History Narrative   Works as a Dealer, Careers adviser   Outpatient Encounter Prescriptions as of 12/17/2016  Medication Sig  . albuterol (PROAIR HFA) 108 (90 Base) MCG/ACT inhaler Inhale 2 puffs into the lungs every 6 (six) hours as needed for wheezing or shortness of breath.  Marland Kitchen azithromycin (ZITHROMAX) 250 MG tablet Take 2 on day one then 1 daily x 4 days  . Blood Glucose Monitoring Suppl (ACCU-CHEK AVIVA) device Use as instructed  . budesonide-formoterol (SYMBICORT) 160-4.5 MCG/ACT inhaler Inhale 2 puffs into the lungs 2 (two) times daily.  . citalopram (CELEXA) 40 MG tablet Take 40  mg by mouth daily.  . diclofenac sodium (VOLTAREN) 1 % GEL Apply 4 g topically 4 (four) times daily.  Marland Kitchen EASY TOUCH PEN NEEDLES 31G X 8 MM MISC DEVICE TO INJECT INSULIN 4 TIMES DAILY.  Marland Kitchen gabapentin (NEURONTIN) 300 MG capsule Take 300 mg by mouth 2 (two) times daily. Reported on 01/25/2016  . glucose blood test strip Use as instructed 4 x daily. E11.65  Contour next  . insulin aspart (NOVOLOG FLEXPEN) 100 UNIT/ML FlexPen INJECT 10-16 UNITS SUBCUTANEOUSLY 3 TIMES DAILY BEFORE MEALS, up To 48 units of NovoLog daily  . Insulin Glargine (LANTUS SOLOSTAR) 100 UNIT/ML Solostar Pen Inject 70 Units into the skin daily at 10 pm.  . metFORMIN (GLUCOPHAGE) 500 MG tablet TAKE (1) TABLET TWICE A DAY WITH MEALS.  . metoprolol succinate (TOPROL-XL) 25 MG 24 hr tablet Take 25 mg by mouth daily.  . OXYGEN 2lpm with sleep only  [name]  . potassium chloride (MICRO-K) 10 MEQ CR capsule Take 10 mEq by mouth daily.  . predniSONE (DELTASONE) 10 MG tablet Take  4 each am x 2 days,   2 each am x 2 days,  1 each am x 2 days  and stop  . Tiotropium Bromide Monohydrate (SPIRIVA RESPIMAT) 2.5 MCG/ACT AERS 2 puffs each am  . valsartan (DIOVAN) 80 MG tablet Take 1 tablet (80 mg total) by mouth daily.  . [DISCONTINUED] doxycycline (VIBRA-TABS) 100 MG tablet Take 1 tablet (100 mg total) by mouth 2 (two) times daily.  . [DISCONTINUED] LANTUS SOLOSTAR 100 UNIT/ML Solostar Pen INJECT 70 UNITS SUBCUTANEOUSLY AT BEDTIME.  . [DISCONTINUED] NOVOLOG FLEXPEN 100 UNIT/ML FlexPen INJECT 10 UNITS SUBCUTANEOUSLY 3 TIMES DAILY BEFORE MEALS.   No facility-administered encounter medications on file as of 12/17/2016.    ALLERGIES: Allergies  Allergen Reactions  . Penicillins    VACCINATION STATUS: Immunization History  Administered Date(s) Administered  . Influenza,inj,Quad PF,36+ Mos 03/29/2016  . Pneumococcal-Unspecified 07/08/2012    Diabetes  He presents for his follow-up diabetic visit. He has type 2 diabetes mellitus. Onset time:  He was diagnosed at approximate age of 28 years. His disease course has been worsening. There are no hypoglycemic associated symptoms. Pertinent negatives for hypoglycemia include no confusion, headaches, pallor or seizures. Associated symptoms include polydipsia, polyphagia, polyuria and visual change. Pertinent negatives for diabetes include no chest pain, no fatigue and no weakness. There are no hypoglycemic complications. Symptoms are worsening. Diabetic complications include peripheral neuropathy. Risk factors for coronary artery disease include diabetes mellitus, dyslipidemia, family history, hypertension, male sex, obesity, sedentary lifestyle and tobacco exposure. Current diabetic treatment includes insulin injections and oral agent (monotherapy) (He did not keep up with his appointments, ran out of most of his medications. His most recent A1c has increased to 11.6%.). He is compliant with treatment none of the time. His weight is decreasing steadily. He is following a generally unhealthy diet. When asked about meal planning, he reported none. He has not had a previous visit with a dietitian (He missed his appointments with the dietitian as well.). He never participates in exercise. He monitors blood glucose at home 1-2 x per day. Blood glucose monitoring compliance is adequate. His home blood glucose trend is decreasing steadily. His overall blood glucose range is >200 mg/dl. (He came with inadequate monitoring of blood glucose. He documents 0-1 time of random monitoring daily.) An ACE inhibitor/angiotensin II receptor blocker is being taken. Eye exam is current.  Hyperlipidemia  This is a chronic problem. The current episode started more than 1 year ago. Pertinent negatives include no chest pain, myalgias or shortness of breath. Risk factors for coronary artery disease include family history, dyslipidemia, diabetes mellitus, hypertension, male sex, obesity and a sedentary lifestyle.  Hypertension  This  is a chronic problem. The current episode started more than 1 year ago. Pertinent negatives include no chest pain, headaches, neck pain, palpitations or shortness of breath. Risk factors for coronary artery disease include diabetes mellitus, dyslipidemia, male gender, smoking/tobacco exposure, sedentary lifestyle and obesity. Past treatments include ACE inhibitors.     Review of Systems  Constitutional: Negative for chills, fatigue, fever and unexpected weight change.  HENT: Negative for dental problem, mouth sores and trouble swallowing.   Eyes: Negative for visual disturbance.  Respiratory: Negative for cough, choking, chest tightness, shortness of breath and wheezing.   Cardiovascular: Negative for chest pain, palpitations and leg swelling.  Gastrointestinal: Negative for abdominal distention, abdominal pain, constipation, diarrhea, nausea and vomiting.  Endocrine: Positive for polydipsia, polyphagia and polyuria.  Genitourinary: Negative for dysuria, flank pain, hematuria and urgency.  Musculoskeletal: Negative for back pain, gait problem, myalgias and neck pain.  Skin: Negative for pallor, rash and wound.  Neurological: Negative  for seizures, syncope, weakness, numbness and headaches.  Psychiatric/Behavioral: Negative.  Negative for confusion and dysphoric mood.    Objective:    BP 120/83   Pulse 92   Ht 5\' 11"  (1.803 m)   Wt 237 lb (107.5 kg)   BMI 33.05 kg/m   Wt Readings from Last 3 Encounters:  12/17/16 237 lb (107.5 kg)  06/28/16 249 lb (112.9 kg)  05/16/16 241 lb (109.3 kg)    Physical Exam  Constitutional: He is oriented to person, place, and time. He appears well-developed. He is cooperative. No distress.  Obese, disheveled. Has poor hygiene.  HENT:  Head: Normocephalic and atraumatic.  He has poor oral hygiene.   Eyes: EOM are normal.  Neck: Normal range of motion. Neck supple. No tracheal deviation present. No thyromegaly present.  Cardiovascular: Normal rate,  S1 normal, S2 normal and normal heart sounds.  Exam reveals no gallop.   No murmur heard. Pulses:      Dorsalis pedis pulses are 1+ on the right side, and 1+ on the left side.       Posterior tibial pulses are 1+ on the right side, and 1+ on the left side.  Pulmonary/Chest: Breath sounds normal. No respiratory distress. He has no wheezes.  Abdominal: Soft. Bowel sounds are normal. He exhibits no distension. There is no tenderness. There is no guarding and no CVA tenderness.  Musculoskeletal: He exhibits no edema.       Right shoulder: He exhibits no swelling and no deformity.  Neurological: He is alert and oriented to person, place, and time. He has normal strength and normal reflexes. No cranial nerve deficit or sensory deficit. Gait normal.  Skin: Skin is warm and dry. No rash noted. No cyanosis. Nails show no clubbing.  Psychiatric: His speech is normal. Cognition and memory are normal.  Noncompliant/reluctant affect.   12/10/2016 A1c 10.6%.    Assessment & Plan:   1. Uncontrolled type 2 diabetes mellitus with complication, with long-term current use of insulin ([place])  His diabetes is  complicated by noncompliance/nonadherence, peripheral neuropathy, obesity, and patient remains at a high risk for more acute and chronic complications of diabetes which include CAD, CVA, CKD, retinopathy, and neuropathy. These are all discussed in detail with the patient.  Patient came After long absence from clinic, with rare , random  monitoring of blood glucose averaging above 245. His A1c has Increased to 10.6% from 9.5%.    Glucose logs and insulin administration records pertaining to this visit,  to be scanned into patient's records.  Recent labs reviewed.   - I have re-counseled the patient on diet management and weight loss  by adopting a carbohydrate restricted / protein rich  Diet.  - Suggestion is made for patient to avoid simple carbohydrates   from his diet including Cakes , Desserts, Ice  Cream,  Soda (  diet and regular) , Sweet Tea , Candies,  Chips, Cookies, Artificial Sweeteners,   and "Sugar-free" Products .  This will help patient to have stable blood glucose profile and potentially avoid unintended  Weight gain.  - Patient is advised to stick to a routine mealtimes to eat 3 meals  a day and avoid unnecessary snacks ( to snack only to correct hypoglycemia).  - The patient  has been  scheduled with Jearld Fenton, RDN, CDE for individualized DM education.  - I have approached patient with the following individualized plan to manage diabetes and patient agrees.  - I approached him for better  engagement because he needs basal/bolus insulin. - I will continue his basal insulin Lantus  70 units QHS, continue NovoLog 10 units 3 times a day before meals for pre-meal blood glucose between 90 and 150 minute grams per deciliter plus correction for above 150 mg/dL, associated with strict monitoring of glucose  4 times a day-before meals and at bedtime.  -Adjustment parameters are given for hypo and hyperglycemia in writing. -Patient is encouraged to call clinic for blood glucose levels less than 70 or above 300 mg /dl. - I will continue metformin 500 mg by mouth twice a day, therapeutically suitable for patient.  -  He has history of left partial nephrectomy for reported renal cell carcinoma hence he only has right kidney.  - Patient specific target  for A1c; LDL, HDL, Triglycerides, and  Waist Circumference were discussed in detail.  2) BP/HTN: Controlled. Continue current medications including ACEI/ARB. 3) Lipids/HPL: Control unknown, he is not on statins. I will obtain fasting lipid panel on subsequent visit. 4)  Weight/Diet: CDE consult in progress, exercise, and carbohydrates information provided.  5) Chronic Care/Health Maintenance:  -Patient is on ACEI/ARB and encouraged to continue to follow up with Ophthalmology, Podiatrist at least yearly or according to recommendations,  and advised to quit smoking. I have recommended yearly flu vaccine and pneumonia vaccination at least every 5 years; moderate intensity exercise for up to 150 minutes weekly; and  sleep for at least 7 hours a day.  - 25 minutes of time was spent on the care of this patient , 50% of which was applied for counseling on diabetes complications and their preventions.  - I advised patient to maintain close follow up with Glenda Chroman, MD for primary care needs.  Patient is asked to bring meter and  blood glucose logs during his next visit.   Follow up plan: -Return in about 4 weeks (around 01/14/2017) for follow up with meter and logs- no labs.  Glade Lloyd, MD Phone: [phone]  Fax: [phone]   12/17/2016, 2:35 PM

## 2016-12-25 IMAGING — MR MR LUMBAR SPINE W/O CM
4 of 5 series · 14 of 48 positions shown · non-contrast
Comparison: 02/16/2016 plain film exam lumbar spine.

CLINICAL DATA: 40-year-old male low back pain extending down both
legs for the past 3 months. Fall February 2016. Prior partial left
nephrectomy for renal cell carcinoma. Subsequent encounter.

EXAM:
MRI LUMBAR SPINE WITHOUT CONTRAST
TECHNIQUE: Multiplanar, multisequence MR imaging of the lumbar spine was
performed. No intravenous contrast was administered.

[Series 4: T2 · sagittal · 4.0mm · 0.80mm/px · 4 of 13 slices shown (1 of 2)]
[im 1/13]
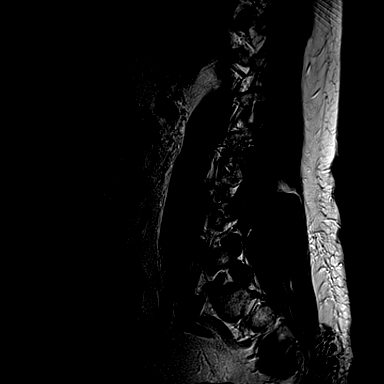
[im 5/13]
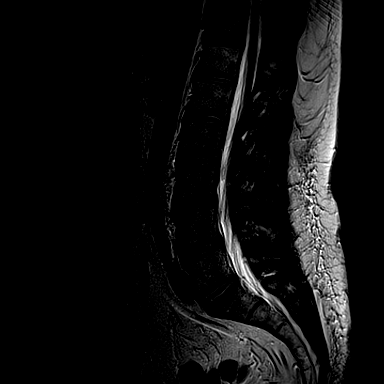
[im 9/13]
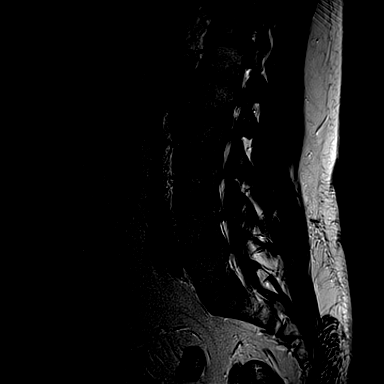
[im 13/13]
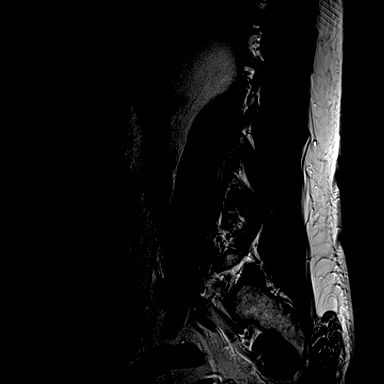

[Series 5: T1 · sagittal · 4.0mm · 0.40mm/px · 3 of 13 slices shown (1 of 2)]
[im 1/13]
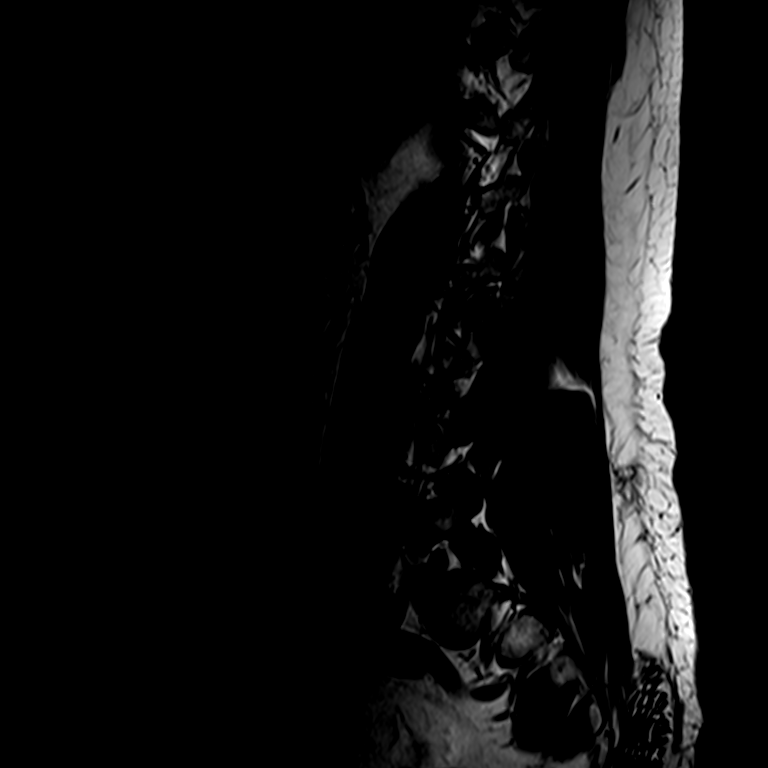
[im 7/13]
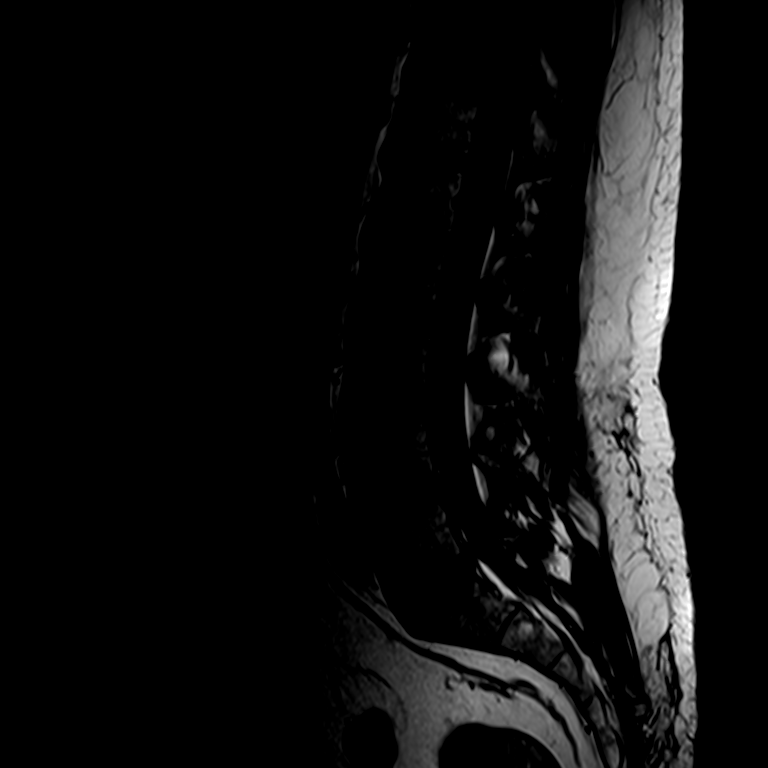
[im 13/13]
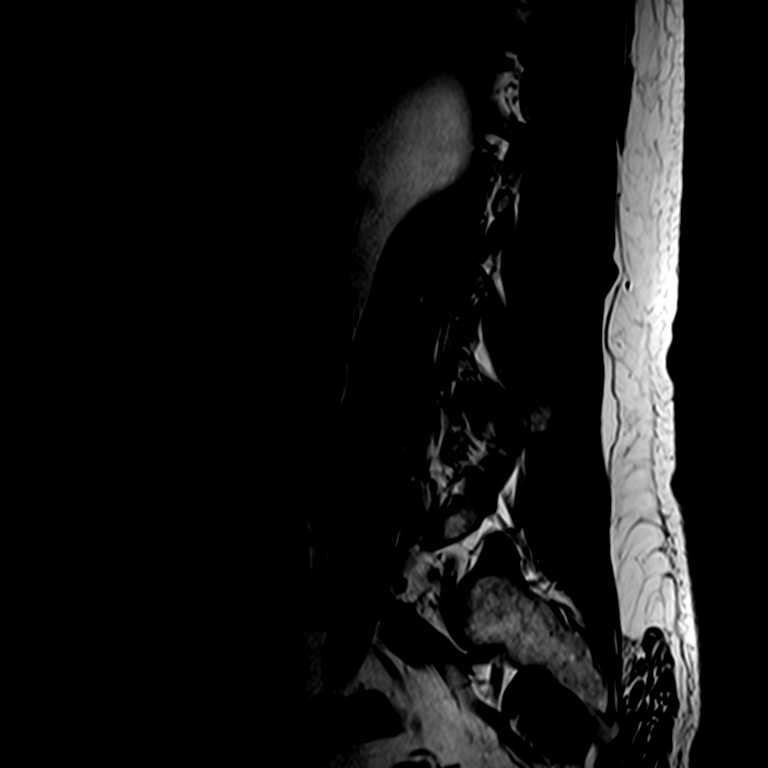

[Series 7: T2 · axial · 4.0mm · 0.24mm/px · z∈[-116,+96]mm · 4 of 48 slices shown (2 of 2)]
[im 3/48]
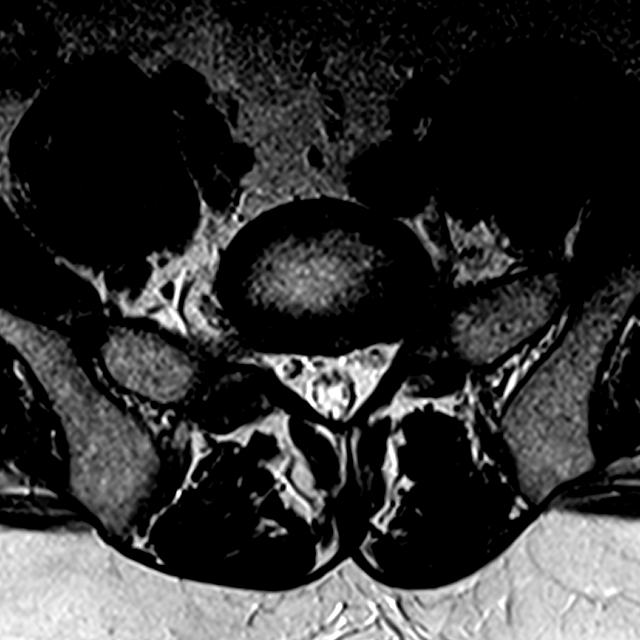
[im 6/48]
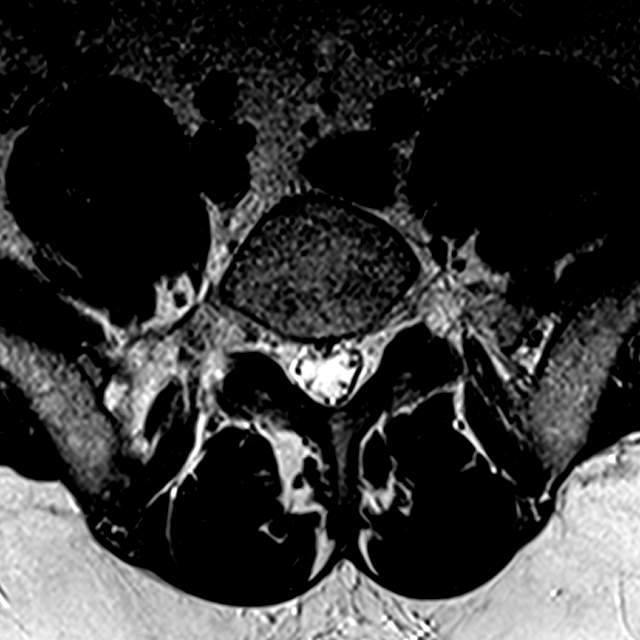
[im 24/48]
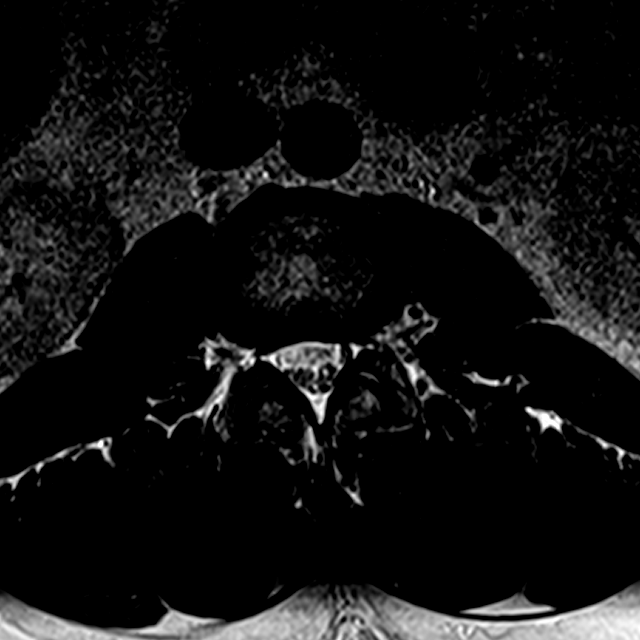
[im 42/48]
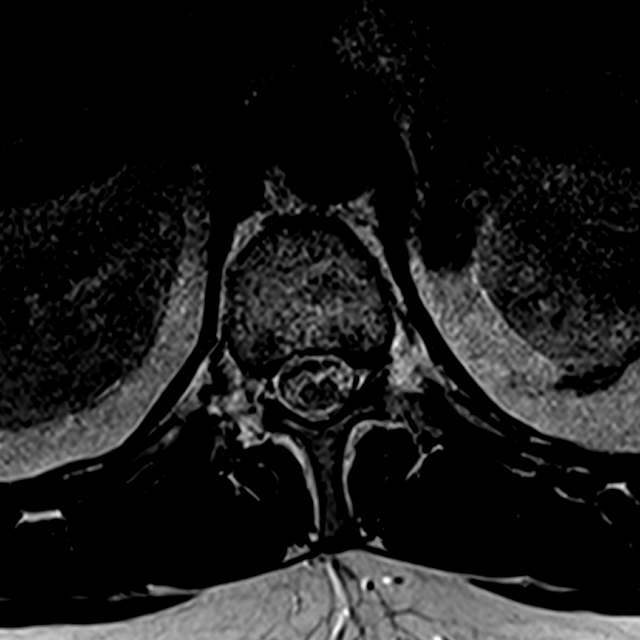

[Series 8: T1 · axial · 4.0mm · 0.24mm/px · z∈[-103,+96]mm · 3 of 48 slices shown (2 of 2)]
[im 6/48]
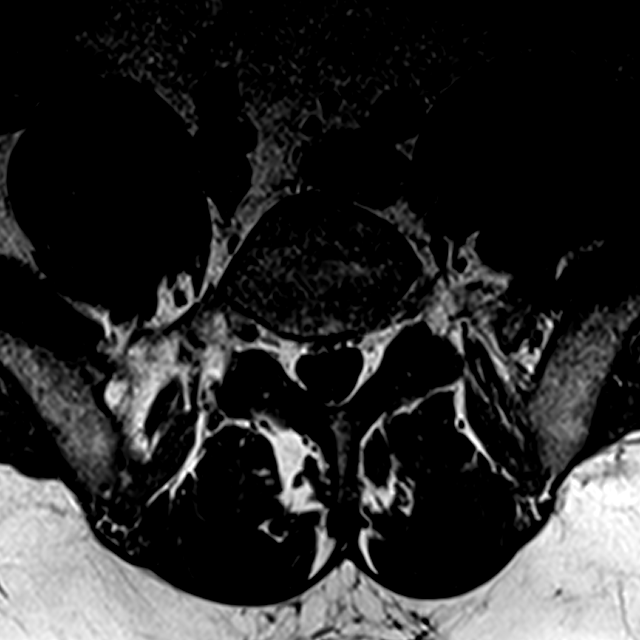
[im 24/48]
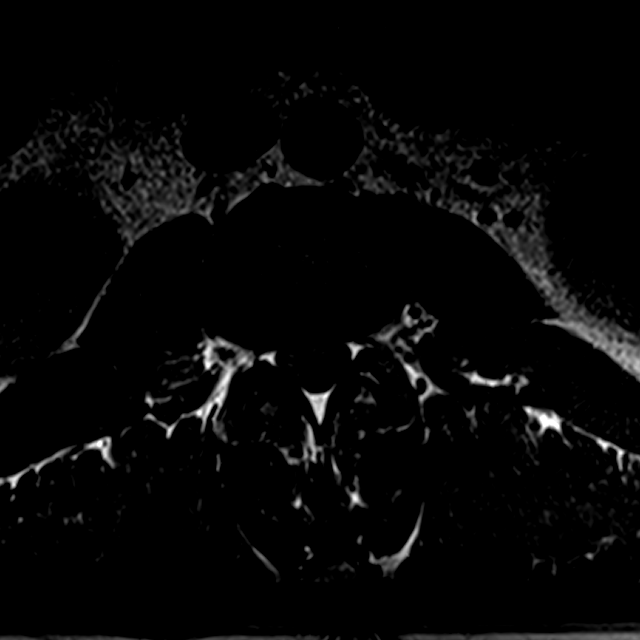
[im 42/48]
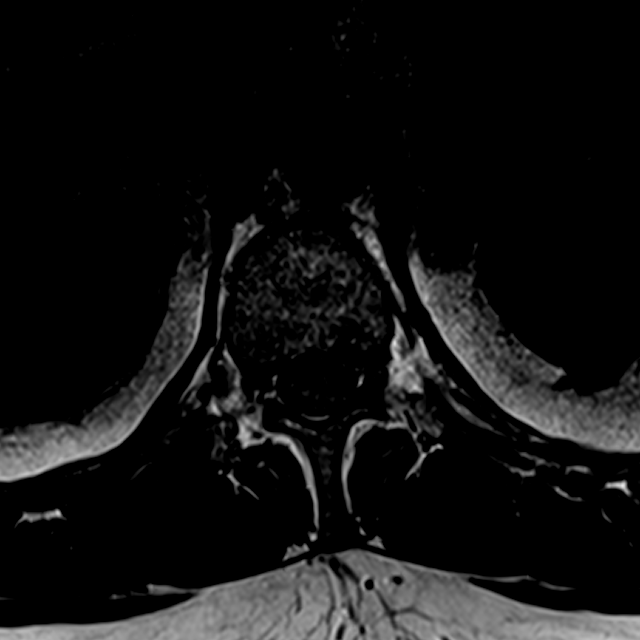

[14 of 48 positions shown; findings below may reference images not displayed]

FINDINGS: Segmentation: Last fully open disk space is labeled L5-S1. Present
examination incorporates from T10-11 disc space through the lower
sacrum.

Alignment:  Minimal curvature.

Vertebrae:  No focal osseous abnormality.

Conus medullaris: Extends to the L1-2 Level and appears normal.

Paraspinal and other soft tissues: left adrenal mass measures up to
2.6 cm. On report of prior abdominal MR 11/28/2008 and CT
11/24/2008, [DATE] mm left adrenal gland adenoma was described and
therefore this may represent an enlarging adenoma. With patient's
history of malignancy, lack of more recent studies and apparent
enlarging left adrenal lesion, one may consider confirming that left
adrenal lesion represents an adenoma with pre and postcontrast
abdominal CT or MR. At such time, postsurgical changes of the left
kidney can also be assessed. Alternatively, if patient's renal cell
cancer follow-up is at an outside institution, correlation with more
recent abdominal CT or MR report recommended.

Disc levels:

T10-11:  Negative.

T11-12: Minimal bulge.

T12-L1:  Negative.

L1-2:  Negative.

L2-3:  Negative.

L3-4:  Minimal to mild facet degenerative changes.

L4-5:  Minimal to mild facet degenerative changes.

L5-S1:  Minimal to mild facet degenerative changes.
IMPRESSION: No spinal stenosis, foraminal narrowing or nerve root compression.

No evidence of osseous metastatic disease.

Enlarging left adrenal lesion possibly enlarging an adenoma as
detailed above. Postsurgical changes left kidney incompletely
assessed.

## 2016-12-27 IMAGING — DX DG CHEST 2V
2 series · 2 of 2 positions shown · non-contrast
Comparison: 03/10/2016

CLINICAL DATA: Cough, congestion, wheezing, dyspnea for 2 weeks.

EXAM:
CHEST  2 VIEW

[chest pa]
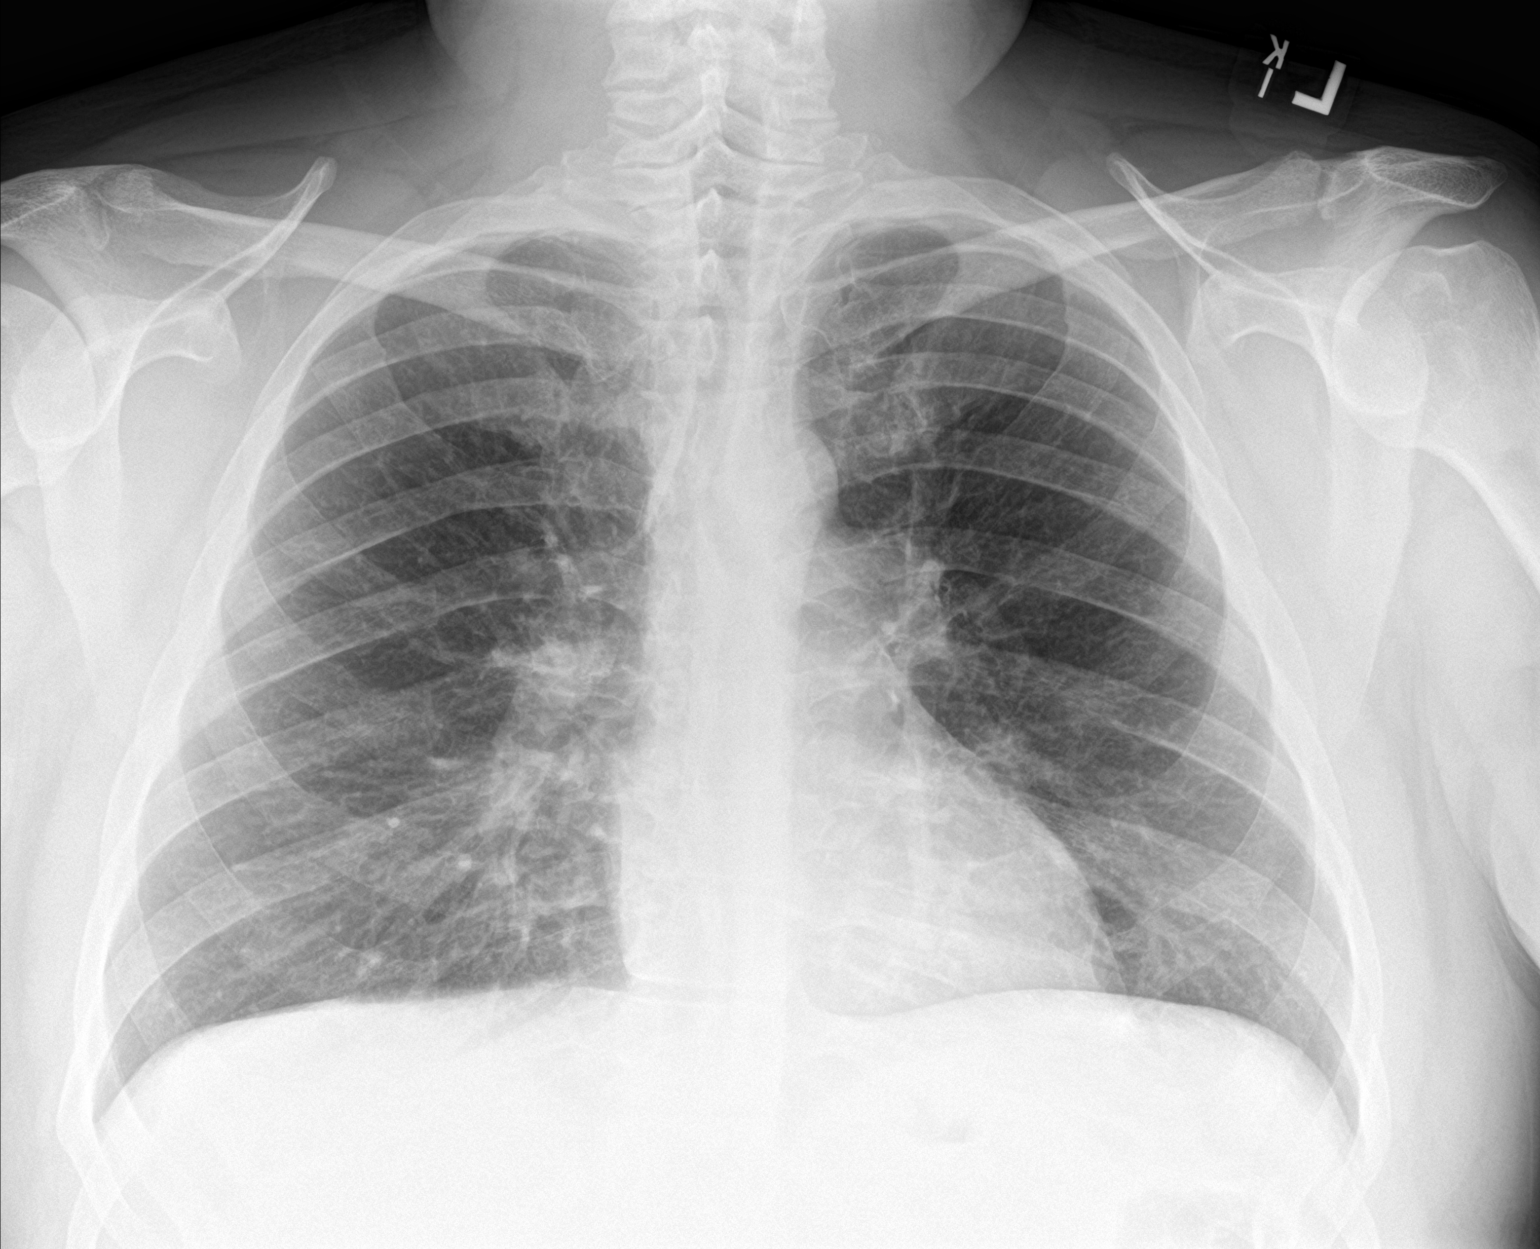

[chest lat]
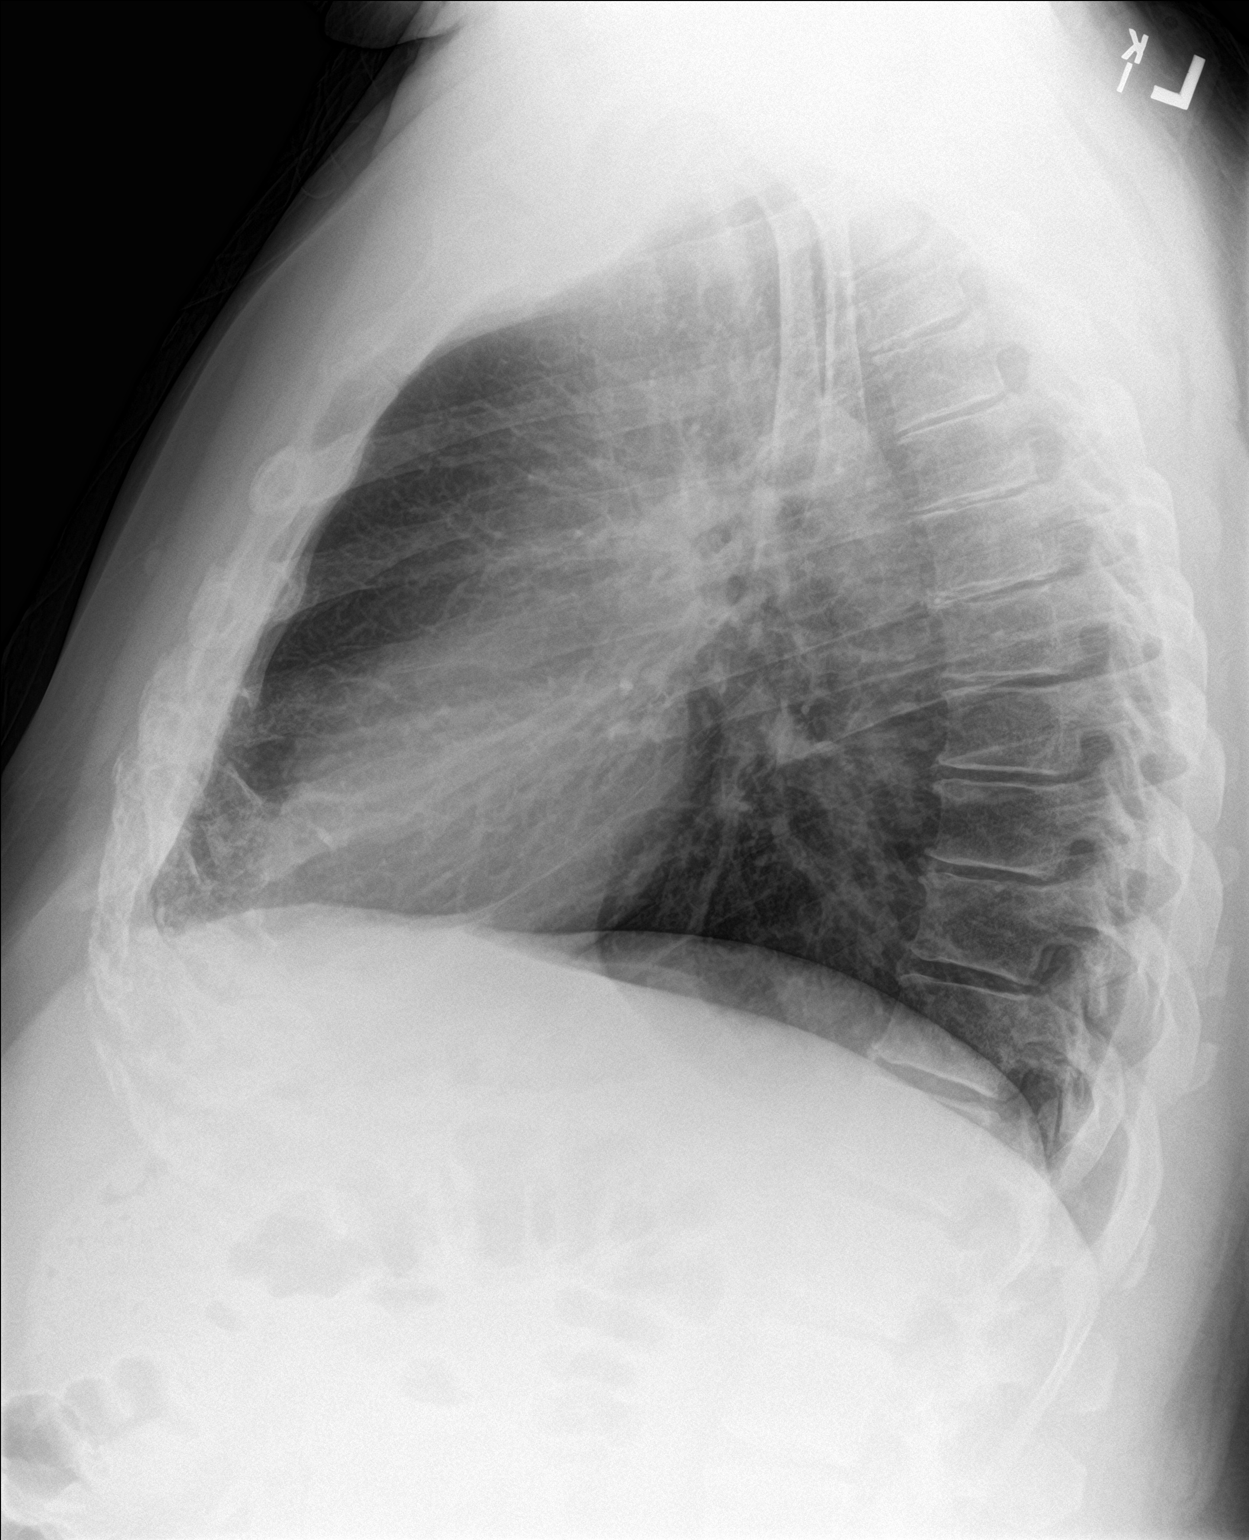

[2 of 2 positions shown; findings below may reference images not displayed]

FINDINGS: Cardiomediastinal silhouette is normal. Mediastinal contours appear
intact.

There is no evidence of focal airspace consolidation, pleural
effusion or pneumothorax. Low lung volumes

Osseous structures are without acute abnormality. Soft tissues are
grossly normal.
IMPRESSION: Low lung volumes, otherwise no active cardiopulmonary disease.

## 2017-01-02 ENCOUNTER — Other Ambulatory Visit: Payer: Self-pay | Admitting: "Endocrinology

## 2017-01-03 ENCOUNTER — Other Ambulatory Visit: Payer: Self-pay | Admitting: "Endocrinology

## 2017-01-10 ENCOUNTER — Ambulatory Visit (INDEPENDENT_AMBULATORY_CARE_PROVIDER_SITE_OTHER): Payer: BLUE CROSS/BLUE SHIELD | Admitting: Internal Medicine

## 2017-01-10 ENCOUNTER — Encounter: Payer: Self-pay | Admitting: Internal Medicine

## 2017-01-10 DIAGNOSIS — J449 Chronic obstructive pulmonary disease, unspecified: Secondary | ICD-10-CM

## 2017-01-10 DIAGNOSIS — J9611 Chronic respiratory failure with hypoxia: Secondary | ICD-10-CM

## 2017-01-10 DIAGNOSIS — F17210 Nicotine dependence, cigarettes, uncomplicated: Secondary | ICD-10-CM | POA: Diagnosis not present

## 2017-01-10 MED ORDER — BUDESONIDE-FORMOTEROL FUMARATE 160-4.5 MCG/ACT IN AERO: 2.0000 | INHALATION_SPRAY | Freq: Two times a day (BID) | RESPIRATORY_TRACT | 0 refills | Status: DC

## 2017-01-10 MED ORDER — BUDESONIDE-FORMOTEROL FUMARATE 160-4.5 MCG/ACT IN AERO: 2.0000 | INHALATION_SPRAY | Freq: Two times a day (BID) | RESPIRATORY_TRACT | 12 refills | Status: DC

## 2017-01-10 MED ORDER — TIOTROPIUM BROMIDE MONOHYDRATE 2.5 MCG/ACT IN AERS: 2.0000 | INHALATION_SPRAY | Freq: Every day | RESPIRATORY_TRACT | 12 refills | Status: DC

## 2017-01-10 MED ORDER — TIOTROPIUM BROMIDE MONOHYDRATE 2.5 MCG/ACT IN AERS: 2.0000 | INHALATION_SPRAY | Freq: Every day | RESPIRATORY_TRACT | 0 refills | Status: DC

## 2017-01-10 NOTE — Patient Instructions (Signed)
Plan A = Automatic = symbicort 160 / spiriva respimat Take 2 puffs first thing in am and then another 2 puffs of just the symbicort about 12 hours later.      Plan B = Backup Only use your albuterol as a rescue medication to be used if you can't catch your breath by resting or doing a relaxed purse lip breathing pattern.  - The less you use it, the better it will work when you need it. - Ok to use the inhaler up to 2 puffs  every 4 hours if you must but call for appointment if use goes up over your usual need - Don't leave home without it !!  (think of it like the spare tire for your car)   Plan C = Crisis - only use your albuterol nebulizer if you first try Plan B and it fails to help > ok to use the nebulizer up to every 4 hours but if start needing it regularly call for immediate appointment   The key is to stop smoking completely before smoking completely stops you!   Please schedule a follow up visit in 6 months but call sooner if needed

## 2017-01-10 NOTE — Progress Notes (Addendum)
Subjective:    Patient ID: Matthew Dougherty, male    DOB: [birth year],    MRN: [id number]    Brief patient profile:  40yowm active smoker  from [place] good ex tol as teenager though had dx of asthma starting around age 43-14 and able to play to football/ cross country and had inhaler but didn't use it much until around 2010 need for saba increased and around 2014 placed on maint rx with advair but not better with criteria for severe copd in 04/2014 referred to pulmonary clinic 02/16/2016 by Dr Woody Seller   History of Present Illness  02/16/2016 1st [facility] office visit/ Matthew Dougherty  maint rx advair hfa  spiriva dpi / acei Chief Complaint  Patient presents with  . Pulmonary Consult    Referred by Dr. Woody Seller. Pt states dxed with COPD in 2015. He c/o increased cough and SOB over the past year. He states that some days he gets winded just walking around his house. His cough is prod with clear sputum and tends to be worse first thing in the am and then at night. He states that his symptoms can flare when exposed to certain smells and also with weather change.   placed on acei x 10 y and cough worse x around 2 years prior to OV  - Dougherty classic pleuritic or ex cp Doe = MMRC4  = sob if tries to leave home or while getting dressed   Some better with saba rec Plan A = Automatic = Advair 115 Take 2 puffs first thing in am and then another 2 puffs about 12 hours later.                                      Spiriva respimat 2 pffs each am  Plan B = Backup Only use your albuterol as a rescue medication to be used if you can't catch your breath by resting or doing a relaxed purse lip breathing pattern.  Stop lisinopril and start diovan (valsartan) 80 mg daily  The key is to stop smoking completely before smoking completely stops you - at least cut down if you can.       03/29/2016  f/u ov/Matthew Dougherty re: COPD III/ AB /still smoking on advair /spiriva respimat still actively smoking  Chief Complaint  Patient  presents with  . Follow-up    PFT's done today. He still smokes 1 ppd. He states that he is interested in quitting. His breathing is unchanged since the last visit.   cough is better, amble to walk full wm = MMRC2 = can't walk a nl pace on a flat grade s sob but does fine slow and flat  Only using albuterol when over does it  rec Stop advair and symbicort 160 Take 2 puffs first thing in am and then another 2 puffs about 12 hours later.  Continue spiriva 2puff each am  Call me if problems acquiring spiriva  The key is to stop smoking completely before smoking completely stops you - it's not too late     01/10/2017  f/u ov/Matthew Dougherty re: Copd III / AB  / still actively smoking / ran out of symb/spiriva ? when Chief Complaint  Patient presents with  . Follow-up    Breathing has been worse "since the last time I had a lung infection"- march 2018. He continues to smoke.   mucus is clear now after  stirs in am doesn't wake him Last albuterol  4.5h and much more use since ran out of maint rx He didn't realize he could get symb for free Doe = MMRC3 = can't walk 100 yards even at a slow pace at a flat grade s stopping due to sob  RA  Dougherty obvious day to day or daytime variability or assoc  purulent sputum or mucus plugs or hemoptysis or cp or chest tightness, subjective wheeze or overt sinus or hb symptoms. Dougherty unusual exp hx or h/o childhood pna/ asthma or knowledge of premature birth.  Sleeping ok without nocturnal  or early am exacerbation  of respiratory  c/o's or need for noct saba. Also denies any obvious fluctuation of symptoms with weather or environmental changes or other aggravating or alleviating factors except as outlined above   Current Medications, Allergies, Complete Past Medical History, Past Surgical History, Family History, and Social History were reviewed in [employer] record.  ROS  The following are not active complaints unless bolded sore throat, dysphagia,  dental problems, itching, sneezing,  nasal congestion or excess/ purulent secretions, ear ache,   fever, chills, sweats, unintended wt loss, classically pleuritic or exertional cp,  orthopnea pnd or leg swelling, presyncope, palpitations, abdominal pain, anorexia, nausea, vomiting, diarrhea  or change in bowel or bladder habits, change in stools or urine, dysuria,hematuria,  rash, arthralgias, visual complaints, headache, numbness, weakness or ataxia or problems with walking or coordination,  change in mood/affect or memory.                              Objective:   Physical Exam  oibese wm smells heavily of cigs / congested sounding coughing fits    01/10/2017        244  06/28/2016     249  03/29/2016       239  02/16/16 226 lb (102.5 kg)  02/08/16 229 lb 6 oz (104 kg)  01/25/16 222 lb (100.7 kg)    Vital signs reviewed - 02 sat 99% RA on arrival   HEENT: nl dentition, turbinates, and oropharynx. Nl external ear canals without cough reflex   NECK :  without JVD/Nodes/TM/ nl carotid upstrokes bilaterally   LUNGS: Dougherty acc muscle use,  Nl contour chest  With coarse insp and exp rhonchi bilaterally (Dougherty symb or spiriva prior to exam)   CV:  RRR  Dougherty s3 or murmur or increase in P2, Dougherty edema   ABD:  soft and nontender with nl inspiratory excursion in the supine position. Dougherty bruits or organomegaly, bowel sounds nl  MS:  Nl gait/ ext warm without deformities, calf tenderness, cyanosis or clubbing Dougherty obvious joint restrictions   SKIN: warm and dry without lesions    NEURO:  alert, approp, nl sensorium with  Dougherty motor deficits      last cxr at [facility] "clear" per pt > report requested> report from 09/18/16 :  nad           Assessment & Plan:

## 2017-01-11 NOTE — Assessment & Plan Note (Signed)
Place on 02 x around  2016 by ENT doctor based on sleep study and reported to pulmonary 01/10/2017    I was not aware he was using 02 hs until today but cautioned it should be monitored by whoever is ordering it and of course no smoking around 02

## 2017-01-11 NOTE — Assessment & Plan Note (Signed)
PFTs 05/06/14   FEV1  1.12 (25%) ratio 65 with 23% improvement p saba in SVC and dlco 62% corrects to 102%  FEV1/SVC = 46%   - 02/16/2016 changed spiriva to respimat/ d/c'd acei  - alpha one screen 02/16/2016 >  MM - PFT's  03/29/2016  FEV1 2.07 (48 % ) ratio 63  p 39 % improvement from saba p nothing prior to study with DLCO  66 % corrects to 102  % for alv volume   - 03/29/2016   try symb 160 2bid instead of advair  - 01/10/2017  After extensive coaching HFA effectiveness =    90%   DDX of  difficult airways management almost all start with A and  include Adherence, Ace Inhibitors, Acid Reflux, Active Sinus Disease, Alpha 1 Antitripsin deficiency, Anxiety masquerading as Airways dz,  ABPA,  Allergy(esp in young), Aspiration (esp in elderly), Adverse effects of meds,  Active smokers, A bunch of PE's (a small clot burden can't cause this syndrome unless there is already severe underlying pulm or vascular dz with poor reserve) plus two Bs  = Bronchiectasis and Beta blocker use..and one C= CHF  Adherence is always the initial "prime suspect" and is a multilayered concern that requires a "trust but verify" approach in every patient - starting with knowing how to use medications, especially inhalers, correctly, keeping up with refills and understanding the fundamental difference between maintenance and prns vs those medications only taken for a very short course and then stopped and not refilled.  - see hfa teaching - given coupon for free symb thru end of year  - return with all meds in hand using a trust but verify approach to confirm accurate Medication  Reconciliation The principal here is that until we are certain that the  patients are doing what we've asked, it makes no sense to ask them to do more.   Active smoking > most likely suspect here   ? Acid (or non-acid) GERD > always difficult to exclude as up to 75% of pts in some series report no assoc GI/ Heartburn symptoms> rec consider trial of  max  (24h)  acid suppression and diet restrictions .   ? Active sinus dz > low threshold for sinus CT   ? Anxiety > usually at the bottom of this list of usual suspects but should be much higher on this pt's based on H and P and note already on psychotropics/ may be playing a role in non-adherence as well   ? BB effect > unlikely on such low doses of toprol  I had an extended discussion with the patient reviewing all relevant studies completed to date and  lasting 15 to 20 minutes of a 25 minute visit    Each maintenance medication was reviewed in detail including most importantly the difference between maintenance and prns and under what circumstances the prns are to be triggered using an action plan format that is not reflected in the computer generated alphabetically organized AVS.    Please see AVS for specific instructions unique to this visit that I personally wrote and verbalized to the the pt in detail and then reviewed with pt  by my nurse highlighting any  changes in therapy recommended at today's visit to their plan of care.

## 2017-01-11 NOTE — Assessment & Plan Note (Signed)
Body mass index is 34.03 kg/m.  -  trending up Lab Results  Component Value Date   TSH 3.060 05/13/2016     Contributing to gerd risk/ doe/reviewed the need and the process to achieve and maintain neg calorie balance > defer f/u primary care including intermittently monitoring thyroid status

## 2017-01-11 NOTE — Assessment & Plan Note (Signed)

## 2017-01-21 ENCOUNTER — Encounter: Payer: Self-pay | Admitting: "Endocrinology

## 2017-01-21 ENCOUNTER — Ambulatory Visit (INDEPENDENT_AMBULATORY_CARE_PROVIDER_SITE_OTHER): Payer: BLUE CROSS/BLUE SHIELD | Admitting: "Endocrinology

## 2017-01-21 VITALS — BP 125/82 | HR 97 | Ht 71.0 in | Wt 248.0 lb

## 2017-01-21 DIAGNOSIS — F17200 Nicotine dependence, unspecified, uncomplicated: Secondary | ICD-10-CM | POA: Diagnosis not present

## 2017-01-21 DIAGNOSIS — I10 Essential (primary) hypertension: Secondary | ICD-10-CM

## 2017-01-21 DIAGNOSIS — E1165 Type 2 diabetes mellitus with hyperglycemia: Secondary | ICD-10-CM

## 2017-01-21 DIAGNOSIS — IMO0002 Reserved for concepts with insufficient information to code with codable children: Secondary | ICD-10-CM

## 2017-01-21 DIAGNOSIS — Z9119 Patient's noncompliance with other medical treatment and regimen: Secondary | ICD-10-CM | POA: Diagnosis not present

## 2017-01-21 DIAGNOSIS — E118 Type 2 diabetes mellitus with unspecified complications: Secondary | ICD-10-CM

## 2017-01-21 DIAGNOSIS — Z91199 Patient's noncompliance with other medical treatment and regimen due to unspecified reason: Secondary | ICD-10-CM

## 2017-01-21 DIAGNOSIS — Z794 Long term (current) use of insulin: Secondary | ICD-10-CM

## 2017-01-21 MED ORDER — INSULIN ASPART 100 UNIT/ML FLEXPEN: PEN_INJECTOR | SUBCUTANEOUS | 2 refills | Status: DC

## 2017-01-21 MED ORDER — INSULIN GLARGINE 100 UNIT/ML SOLOSTAR PEN: PEN_INJECTOR | SUBCUTANEOUS | 2 refills | Status: DC

## 2017-01-21 MED ORDER — GLUCOSE BLOOD VI STRP: ORAL_STRIP | 5 refills | Status: DC

## 2017-01-21 NOTE — Patient Instructions (Signed)

## 2017-01-21 NOTE — Progress Notes (Signed)
Subjective:    Patient ID: Matthew Dougherty, male    DOB: 1976/01/06, PCP Matthew Chroman, MD   Past Medical History:  Diagnosis Date  . Arthritis   . Asthma   . Cancer ([place])    kidney  . Chronic kidney disease    partial removal of left kidney; cancer  . Chronic knee pain   . COPD (chronic obstructive pulmonary disease) ([place])   . Depression   . Diabetes mellitus without complication ([place])   . Gout   . Hypertension   . Neuropathy   . Osteoarthritis   . Renal cancer ([place])    2010 left partial nephrectomy  . Restless leg syndrome   . Seizures ([place])    few years ago; states that he aspirated and had a seizure; no meds for seizures and none since then   Past Surgical History:  Procedure Laterality Date  . KNEE SURGERY Left    as child; removal of straight pin from under knee cap.  Marland Kitchen PARTIAL NEPHRECTOMY Left   . pinched nerves in back    . SEPTOPLASTY     Social History   Social History  . Marital status: Unknown    Spouse name: N/A  . Number of children: N/A  . Years of education: N/A   Social History Main Topics  . Smoking status: Current Every Day Smoker    Packs/day: 1.00    Years: 31.00    Types: Cigarettes  . Smokeless tobacco: Never Used  . Alcohol use Yes     Comment: occasional  . Drug use: No  . Sexual activity: Yes   Other Topics Concern  . None   Social History Narrative   Works as a Dealer, Careers adviser   Outpatient Encounter Prescriptions as of 01/21/2017  Medication Sig  . albuterol (PROAIR HFA) 108 (90 Base) MCG/ACT inhaler Inhale 2 puffs into the lungs every 6 (six) hours as needed for wheezing or shortness of breath.  . budesonide-formoterol (SYMBICORT) 160-4.5 MCG/ACT inhaler Inhale 2 puffs into the lungs 2 (two) times daily.  . budesonide-formoterol (SYMBICORT) 160-4.5 MCG/ACT inhaler Inhale 2 puffs into the lungs 2 (two) times daily.  . budesonide-formoterol (SYMBICORT) 160-4.5 MCG/ACT inhaler Inhale 2 puffs into the lungs 2  (two) times daily.  . citalopram (CELEXA) 40 MG tablet Take 40 mg by mouth daily.  . diclofenac sodium (VOLTAREN) 1 % GEL Apply 4 g topically 4 (four) times daily.  Marland Kitchen gabapentin (NEURONTIN) 300 MG capsule Take 300 mg by mouth 2 (two) times daily. Reported on 01/25/2016  . glucose blood test strip Use as instructed 4 x daily. E11.65  Contour next  . insulin aspart (NOVOLOG FLEXPEN) 100 UNIT/ML FlexPen INJECT 15-21 UNITS SUBCUTANEOUSLY 3 TIMES DAILY BEFORE MEALS, up To 60 units of NovoLog daily  . Insulin Glargine (LANTUS SOLOSTAR) 100 UNIT/ML Solostar Pen INJECT 80 UNITS SUBCUTANEOUSLY AT BEDTIME.  . meloxicam (MOBIC) 7.5 MG tablet Take 7.5 mg by mouth daily.  . metFORMIN (GLUCOPHAGE) 500 MG tablet TAKE (1) TABLET TWICE A DAY WITH MEALS.  . metoprolol succinate (TOPROL-XL) 25 MG 24 hr tablet Take 25 mg by mouth daily.  . OXYGEN 2lpm with sleep only  [name]  . potassium chloride (MICRO-K) 10 MEQ CR capsule Take 10 mEq by mouth daily.  . Tiotropium Bromide Monohydrate (SPIRIVA RESPIMAT) 2.5 MCG/ACT AERS 2 puffs each am  . Tiotropium Bromide Monohydrate (SPIRIVA RESPIMAT) 2.5 MCG/ACT AERS Inhale 2 puffs into the lungs daily.  . Tiotropium Bromide  Monohydrate (SPIRIVA RESPIMAT) 2.5 MCG/ACT AERS Inhale 2 puffs into the lungs daily.  . valsartan (DIOVAN) 80 MG tablet Take 1 tablet (80 mg total) by mouth daily.  . [DISCONTINUED] Blood Glucose Monitoring Suppl (ACCU-CHEK AVIVA) device Use as instructed  . [DISCONTINUED] EASY TOUCH PEN NEEDLES 31G X 8 MM MISC DEVICE TO INJECT INSULIN 4 TIMES DAILY.  . [DISCONTINUED] glucose blood test strip Use as instructed 4 x daily. E11.65  Contour next  . [DISCONTINUED] insulin aspart (NOVOLOG FLEXPEN) 100 UNIT/ML FlexPen INJECT 10-16 UNITS SUBCUTANEOUSLY 3 TIMES DAILY BEFORE MEALS, up To 48 units of NovoLog daily  . [DISCONTINUED] LANTUS SOLOSTAR 100 UNIT/ML Solostar Pen INJECT 70 UNITS SUBCUTANEOUSLY AT BEDTIME.   No facility-administered encounter medications on  file as of 01/21/2017.    ALLERGIES: Allergies  Allergen Reactions  . Penicillins    VACCINATION STATUS: Immunization History  Administered Date(s) Administered  . Influenza,inj,Quad PF,36+ Mos 03/29/2016  . Pneumococcal-Unspecified 07/08/2012    Diabetes  He presents for his follow-up diabetic visit. He has type 2 diabetes mellitus. Onset time: He was diagnosed at approximate age of 5 years. His disease course has been worsening. There are no hypoglycemic associated symptoms. Pertinent negatives for hypoglycemia include no confusion, headaches, pallor or seizures. Associated symptoms include polydipsia, polyphagia, polyuria and visual change. Pertinent negatives for diabetes include no chest pain, no fatigue and no weakness. There are no hypoglycemic complications. Symptoms are worsening. Diabetic complications include peripheral neuropathy. Risk factors for coronary artery disease include diabetes mellitus, dyslipidemia, family history, hypertension, male sex, obesity, sedentary lifestyle and tobacco exposure. Current diabetic treatment includes insulin injections and oral agent (monotherapy) (He did not keep up with his appointments, ran out of most of his medications. His most recent A1c has increased to 11.6%.). He is compliant with treatment none of the time. His weight is increasing steadily. He is following a generally unhealthy diet. When asked about meal planning, he reported none. He has not had a previous visit with a dietitian (He missed his appointments with the dietitian as well.). He never participates in exercise. He monitors blood glucose at home 1-2 x per day. Blood glucose monitoring compliance is adequate. His home blood glucose trend is decreasing steadily. His overall blood glucose range is >200 mg/dl. (He came with inadequate monitoring of blood glucose. He documents 0-1 time of random monitoring daily.) An ACE inhibitor/angiotensin II receptor blocker is being taken. Eye exam  is current.  Hyperlipidemia  This is a chronic problem. The current episode started more than 1 year ago. Pertinent negatives include no chest pain, myalgias or shortness of breath. Risk factors for coronary artery disease include family history, dyslipidemia, diabetes mellitus, hypertension, male sex, obesity and a sedentary lifestyle.  Hypertension  This is a chronic problem. The current episode started more than 1 year ago. Pertinent negatives include no chest pain, headaches, neck pain, palpitations or shortness of breath. Risk factors for coronary artery disease include diabetes mellitus, dyslipidemia, male gender, smoking/tobacco exposure, sedentary lifestyle and obesity. Past treatments include ACE inhibitors.     Review of Systems  Constitutional: Negative for chills, fatigue, fever and unexpected weight change.  HENT: Negative for dental problem, mouth sores and trouble swallowing.   Eyes: Negative for visual disturbance.  Respiratory: Negative for cough, choking, chest tightness, shortness of breath and wheezing.   Cardiovascular: Negative for chest pain, palpitations and leg swelling.  Gastrointestinal: Negative for abdominal distention, abdominal pain, constipation, diarrhea, nausea and vomiting.  Endocrine: Positive for polydipsia, polyphagia  and polyuria.  Genitourinary: Negative for dysuria, flank pain, hematuria and urgency.  Musculoskeletal: Negative for back pain, gait problem, myalgias and neck pain.  Skin: Negative for pallor, rash and wound.  Neurological: Negative for seizures, syncope, weakness, numbness and headaches.  Psychiatric/Behavioral: Negative.  Negative for confusion and dysphoric mood.    Objective:    BP 125/82   Pulse 97   Ht 5\' 11"  (1.803 m)   Wt 248 lb (112.5 kg)   BMI 34.59 kg/m   Wt Readings from Last 3 Encounters:  01/21/17 248 lb (112.5 kg)  01/10/17 244 lb (110.7 kg)  12/17/16 237 lb (107.5 kg)    Physical Exam  Constitutional: He is  oriented to person, place, and time. He appears well-developed. He is cooperative. No distress.  Obese, disheveled. Has poor hygiene.  HENT:  Head: Normocephalic and atraumatic.  He has poor oral hygiene.   Eyes: EOM are normal.  Neck: Normal range of motion. Neck supple. No tracheal deviation present. No thyromegaly present.  Cardiovascular: Normal rate, S1 normal, S2 normal and normal heart sounds.  Exam reveals no gallop.   No murmur heard. Pulses:      Dorsalis pedis pulses are 1+ on the right side, and 1+ on the left side.       Posterior tibial pulses are 1+ on the right side, and 1+ on the left side.  Pulmonary/Chest: Breath sounds normal. No respiratory distress. He has no wheezes.  Abdominal: Soft. Bowel sounds are normal. He exhibits no distension. There is no tenderness. There is no guarding and no CVA tenderness.  Musculoskeletal: He exhibits no edema.       Right shoulder: He exhibits no swelling and no deformity.  Neurological: He is alert and oriented to person, place, and time. He has normal strength and normal reflexes. No cranial nerve deficit or sensory deficit. Gait normal.  Skin: Skin is warm and dry. No rash noted. No cyanosis. Nails show no clubbing.  Psychiatric: His speech is normal. Cognition and memory are normal.  Noncompliant/reluctant affect.   12/10/2016 A1c 10.6%.    Assessment & Plan:   1. Uncontrolled type 2 diabetes mellitus with complication, with long-term current use of insulin ([place])  His diabetes is  complicated by noncompliance/nonadherence, peripheral neuropathy, obesity, and patient remains at a high risk for more acute and chronic complications of diabetes which include CAD, CVA, CKD, retinopathy, and neuropathy. These are all discussed in detail with the patient.  Patient came After long absence from clinic, with rare , random  monitoring of blood glucose averaging above 245. His A1c has Increased to 10.6% from 9.5%.    Glucose logs and  insulin administration records pertaining to this visit,  to be scanned into patient's records.  Recent labs reviewed.   - I have re-counseled the patient on diet management and weight loss  by adopting a carbohydrate restricted / protein rich  Diet.  - Suggestion is made for patient to avoid simple carbohydrates   from his diet including Cakes , Desserts, Ice Cream,  Soda (  diet and regular) , Sweet Tea , Candies,  Chips, Cookies, Artificial Sweeteners,   and "Sugar-free" Products .  This will help patient to have stable blood glucose profile and potentially avoid unintended  Weight gain.  - Patient is advised to stick to a routine mealtimes to eat 3 meals  a day and avoid unnecessary snacks ( to snack only to correct hypoglycemia).  - The patient  has been  scheduled with Matthew Dougherty, RDN, CDE for individualized DM education.  - I have approached patient with the following individualized plan to manage diabetes and patient agrees.  - I approached him for better engagement because he needs basal/bolus insulin. - I will increase his basal insulin Lantus  To 80 units daily at bedtime, increase NovoLog to 15 units 3 times a day before meals for pre-meal blood glucose between 90 and 150 minute grams per deciliter plus correction for above 150 mg/dL, associated with strict monitoring of glucose  4 times a day-before meals and at bedtime.  -Adjustment parameters are given for hypo and hyperglycemia in writing. -Patient is encouraged to call clinic for blood glucose levels less than 70 or above 300 mg /dl. - I will continue metformin 500 mg by mouth twice a day, therapeutically suitable for patient.  -  He has history of left partial nephrectomy for reported renal cell carcinoma hence he only has right kidney.  - Patient specific target  for A1c; LDL, HDL, Triglycerides, and  Waist Circumference were discussed in detail.  2) BP/HTN: Controlled. Continue current medications including  ACEI/ARB. 3) Lipids/HPL: Control unknown, he is not on statins. I will obtain fasting lipid panel on subsequent visit. 4)  Weight/Diet: CDE consult in progress, exercise, and carbohydrates information provided.  5) Chronic Care/Health Maintenance:  -Patient is on ACEI/ARB and encouraged to continue to follow up with Ophthalmology, Podiatrist at least yearly or according to recommendations, and advised to quit smoking. I have recommended yearly flu vaccine and pneumonia vaccination at least every 5 years; moderate intensity exercise for up to 150 minutes weekly; and  sleep for at least 7 hours a day.  - 25 minutes of time was spent on the care of this patient , 50% of which was applied for counseling on diabetes complications and their preventions.  - I advised patient to maintain close follow up with Matthew Chroman, MD for primary care needs.  Patient is asked to bring meter and  blood glucose logs during his next visit.   Follow up plan: -Return in about 3 months (around 04/23/2017) for meter, and logs, follow up with pre-visit labs, meter, and logs.  Glade Lloyd, MD Phone: [phone]  Fax: [phone]   01/21/2017, 12:09 PM

## 2017-03-07 ENCOUNTER — Other Ambulatory Visit: Payer: Self-pay | Admitting: Internal Medicine

## 2017-04-03 ENCOUNTER — Telehealth: Payer: Self-pay | Admitting: Internal Medicine

## 2017-04-03 MED ORDER — PREDNISONE 10 MG PO TABS: ORAL_TABLET | ORAL | 0 refills | Status: DC

## 2017-04-03 MED ORDER — AZITHROMYCIN 250 MG PO TABS: ORAL_TABLET | ORAL | 0 refills | Status: DC

## 2017-04-03 NOTE — Telephone Encounter (Signed)
If not responding to neb needs to go to ER  If responding but needting it more often  That 's fine>> rx zpak/ Prednisone 10 mg take  4 each am x 2 days,   2 each am x 2 days,  1 each am x 2 days and stop

## 2017-04-03 NOTE — Telephone Encounter (Signed)
Called pt and advised message from the provider. Pt understood and verbalized understanding. Nothing further is needed.    

## 2017-04-03 NOTE — Telephone Encounter (Signed)
Attempted to call the pts wife to update her and the pt of what MW stated.  No answer and no VM.  Will try back.

## 2017-04-03 NOTE — Telephone Encounter (Signed)
Spoke with pt's spouse, Joelene Millin. Joelene Millin states pt has used neb treatments 5-6x in the 12hr with very mild improvement.  Joelene Millin states she and pt discussed taking pt to ED after she gets off of work this afternoon if sx worsen.  There is a drug interaction with citalopram and zpak.   MW please advise. Thanks.

## 2017-04-03 NOTE — Addendum Note (Signed)
Addended by: Maryanna Shape A on: 04/03/2017 03:53 PM   Modules accepted: Orders

## 2017-04-03 NOTE — Telephone Encounter (Signed)
Spoke with pt's wife Joelene Millin (dpr on file), c/o increased sob, chest tightness, wheezing.  Pt had a prod cough with yellow mucus X2-3 days but is now unable to cough anything up. Denies fever, sharp chest pain, sinus congestion.    Pt has used albuterol neb and pulmicort neb.  Requesting further recs.   [facility]  MW please advise.  Thanks.   01/10/17 AVS Instructions  Patient Instructions   Plan A = Automatic = symbicort 160 / spiriva respimat Take 2 puffs first thing in am and then another 2 puffs of just the symbicort about 12 hours later.        Plan B = Backup Only use your albuterol as a rescue medication to be used if you can't catch your breath by resting or doing a relaxed purse lip breathing pattern.  - The less you use it, the better it will work when you need it. - Ok to use the inhaler up to 2 puffs  every 4 hours if you must but call for appointment if use goes up over your usual need - Don't leave home without it !!  (think of it like the spare tire for your car)    Plan C = Crisis - only use your albuterol nebulizer if you first try Plan B and it fails to help > ok to use the nebulizer up to every 4 hours but if start needing it regularly call for immediate appointment     The key is to stop smoking completely before smoking completely stops you!    Please schedule a follow up visit in 6 months but call sooner if needed

## 2017-04-03 NOTE — Telephone Encounter (Signed)
Then he should just go to er Cancel the zpak / no abx

## 2017-04-04 ENCOUNTER — Other Ambulatory Visit: Payer: Self-pay | Admitting: Internal Medicine

## 2017-04-07 ENCOUNTER — Other Ambulatory Visit: Payer: Self-pay | Admitting: Internal Medicine

## 2017-04-07 MED ORDER — BUDESONIDE-FORMOTEROL FUMARATE 160-4.5 MCG/ACT IN AERO: 2.0000 | INHALATION_SPRAY | Freq: Two times a day (BID) | RESPIRATORY_TRACT | 5 refills | Status: DC

## 2017-04-24 ENCOUNTER — Ambulatory Visit: Payer: BLUE CROSS/BLUE SHIELD | Admitting: "Endocrinology

## 2017-04-25 ENCOUNTER — Encounter: Payer: Self-pay | Admitting: "Endocrinology

## 2017-05-06 ENCOUNTER — Other Ambulatory Visit: Payer: Self-pay | Admitting: "Endocrinology

## 2017-06-04 ENCOUNTER — Other Ambulatory Visit: Payer: Self-pay | Admitting: "Endocrinology

## 2017-06-27 ENCOUNTER — Telehealth: Payer: Self-pay

## 2017-06-27 NOTE — Telephone Encounter (Signed)
Pt called today wanting [place] to sign paperwork for a Dexcom meter. I told him since he has no showed several visits that we would discuss this at his visit on 07-29-17. Pt does have glucometer and test strips to test BG.

## 2017-07-08 LAB — HEMOGLOBIN A1C: Hemoglobin A1C: 10.5

## 2017-07-14 ENCOUNTER — Ambulatory Visit: Payer: BLUE CROSS/BLUE SHIELD | Admitting: Internal Medicine

## 2017-07-29 ENCOUNTER — Encounter: Payer: Self-pay | Admitting: "Endocrinology

## 2017-07-29 ENCOUNTER — Encounter: Payer: BLUE CROSS/BLUE SHIELD | Admitting: "Endocrinology

## 2017-07-29 ENCOUNTER — Telehealth: Payer: Self-pay

## 2017-07-29 VITALS — BP 137/88 | HR 85 | Ht 71.0 in | Wt 253.0 lb

## 2017-07-29 NOTE — Telephone Encounter (Signed)
Pt in office today for appt. I explained to the pt  that we spoke with the [facility]. They did not do the A1c that was on his lab order. Pt sates that this was our fault because "we did not pay [facility] to have his labs done there". I tried to explained to the pt that we do not pay lab bills but he did not voice understanding. I did ask if he had his Glucometer and BG logs he went on to yell that he doesn't have one. I explained to him that if we had been informed of this we would have sent a prescription to the pharmacy or he could've come to the office to pick one up. Pt became extremely upset stating that we would not fill out paperwork for him to get the [facility]. I had explained to him that we could not fill out this paperwork because he has not been seen and we could not sign off saying that he is following certain advice with his diabetes when we haven't seen him and do not know. He has no showed several appt and cancelled several appts. I did tell him that we could give him a regular glucose meter. He at that point jumped up and say that he wants nothing from [place], he's not coming back, and walks ou the door.

## 2017-07-30 NOTE — Progress Notes (Signed)
This encounter was created in error - please disregard.

## 2017-08-29 LAB — HM DIABETES EYE EXAM

## 2017-09-04 ENCOUNTER — Encounter: Payer: Self-pay | Admitting: Endocrinology

## 2017-09-04 ENCOUNTER — Encounter: Payer: Self-pay | Admitting: Internal Medicine

## 2017-09-04 ENCOUNTER — Ambulatory Visit: Payer: BLUE CROSS/BLUE SHIELD | Admitting: Endocrinology

## 2017-09-04 VITALS — BP 108/62 | HR 100 | Ht 71.0 in | Wt 244.2 lb

## 2017-09-04 DIAGNOSIS — Z794 Long term (current) use of insulin: Secondary | ICD-10-CM

## 2017-09-04 DIAGNOSIS — E118 Type 2 diabetes mellitus with unspecified complications: Secondary | ICD-10-CM | POA: Diagnosis not present

## 2017-09-04 DIAGNOSIS — E1165 Type 2 diabetes mellitus with hyperglycemia: Secondary | ICD-10-CM

## 2017-09-04 DIAGNOSIS — IMO0002 Reserved for concepts with insufficient information to code with codable children: Secondary | ICD-10-CM

## 2017-09-04 MED ORDER — GLUCOSE BLOOD VI STRP: 1.0000 | ORAL_STRIP | Freq: Two times a day (BID) | 12 refills | Status: DC

## 2017-09-04 MED ORDER — INSULIN GLARGINE 100 UNIT/ML SOLOSTAR PEN: 120.0000 [IU] | PEN_INJECTOR | SUBCUTANEOUS | 11 refills | Status: DC

## 2017-09-04 NOTE — Progress Notes (Signed)
Subjective:    Patient ID: Matthew Dougherty, male    DOB: [birth year], 42 y.o.   MRN: [id number]  HPI pt is referred by Dr Woody Seller, for diabetes.  Pt states DM was dx'ed in 2006; he has moderate neuropathy of the lower extremities; he is unaware of any associated chronic complications; he has been on insulin since 2009; pt says his diet and exercise are fair; he has never had pancreatitis, pancreatic surgery, severe hypoglycemia or DKA.  He takes metformin, lantus 80/d, and novolog 15-21 units 3 times a day (just before each meal).  He says novolog causes muscle cramping, so he wants to d/c it.  He says cbg's vary from 54-200's.  It is lowest when a meal (and novolog) are delayed.  It is in general higher as the day goes on.  He sleeps 4 AM-1 PM.  However, he has not recently checked cbg.   Past Medical History:  Diagnosis Date  . Arthritis   . Asthma   . Cancer ([place])    kidney  . Chronic kidney disease    partial removal of left kidney; cancer  . Chronic knee pain   . COPD (chronic obstructive pulmonary disease) ([place])   . Depression   . Diabetes mellitus without complication ([place])   . Gout   . Hypertension   . Neuropathy   . Osteoarthritis   . Renal cancer ([place])    2010 left partial nephrectomy  . Restless leg syndrome   . Seizures ([place])    few years ago; states that he aspirated and had a seizure; no meds for seizures and none since then    Past Surgical History:  Procedure Laterality Date  . KNEE SURGERY Left    as child; removal of straight pin from under knee cap.  Marland Kitchen PARTIAL NEPHRECTOMY Left   . pinched nerves in back    . SEPTOPLASTY      Social History   Socioeconomic History  . Marital status: Unknown    Spouse name: Not on file  . Number of children: Not on file  . Years of education: Not on file  . Highest education level: Not on file  Social Needs  . Financial resource strain: Not on file  . Food insecurity - worry: Not on file  . Food insecurity -  inability: Not on file  . Transportation needs - medical: Not on file  . Transportation needs - non-medical: Not on file  Occupational History  . Not on file  Tobacco Use  . Smoking status: Current Every Day Smoker    Packs/day: 1.00    Years: 31.00    Pack years: 31.00    Types: Cigarettes  . Smokeless tobacco: Never Used  Substance and Sexual Activity  . Alcohol use: Yes    Comment: occasional  . Drug use: No  . Sexual activity: Yes  Other Topics Concern  . Not on file  Social History Narrative   Works as a Dealer, Careers adviser    Current Outpatient Medications on File Prior to Visit  Medication Sig Dispense Refill  . albuterol (PROAIR HFA) 108 (90 Base) MCG/ACT inhaler Inhale 2 puffs into the lungs every 6 (six) hours as needed for wheezing or shortness of breath.    . budesonide (PULMICORT) 0.5 MG/2ML nebulizer solution   3  . budesonide-formoterol (SYMBICORT) 160-4.5 MCG/ACT inhaler Inhale 2 puffs into the lungs 2 (two) times daily. 10.2 g 5  . citalopram (CELEXA) 40 MG tablet Take 40  mg by mouth daily.    . diazepam (VALIUM) 5 MG tablet   0  . gabapentin (NEURONTIN) 300 MG capsule Take 600 mg by mouth 3 (three) times daily. Reported on 01/25/2016    . HYDROcodone-acetaminophen (NORCO) 10-325 MG tablet   0  . metFORMIN (GLUCOPHAGE) 500 MG tablet TAKE (1) TABLET TWICE A DAY WITH MEALS. (Patient taking differently: TAKE 2 TABLETS TWICE A DAY WITH MEALS.) 60 tablet 0  . metoprolol succinate (TOPROL-XL) 25 MG 24 hr tablet Take 25 mg by mouth daily.    . OXYGEN 2lpm with sleep only  [name]    . SPIRIVA RESPIMAT 2.5 MCG/ACT AERS INHALE 2 PUFFS DAILY. 1 Inhaler 5  . Tiotropium Bromide Monohydrate (SPIRIVA RESPIMAT) 2.5 MCG/ACT AERS 2 puffs each am 1 Inhaler 0  . topiramate (TOPAMAX) 25 MG tablet   0  . valsartan (DIOVAN) 80 MG tablet Take 1 tablet (80 mg total) by mouth daily. 30 tablet 11  . meloxicam (MOBIC) 7.5 MG tablet Take 7.5 mg by mouth daily.     No current  facility-administered medications on file prior to visit.     Allergies  Allergen Reactions  . Penicillins     Family History  Problem Relation Age of Onset  . Breast cancer Mother   . Melanoma Father   . Diabetes Sister     BP 108/62 (BP Location: Left Arm, Patient Position: Sitting, Cuff Size: Normal)   Pulse 100   Ht 5\' 11"  (1.803 m)   Wt 244 lb 3.2 oz (110.8 kg)   SpO2 92%   BMI 34.06 kg/m     Review of Systems denies weight loss, blurry vision, chest pain, n/v, nocturia, memory loss, depression, cold intolerance, and easy bruising.  He has headache, fatigue, leg cramps, diaphoresis, rhinorrhea, and doe.    Objective:   Physical Exam VS: see vs page GEN: no distress HEAD: head: no deformity eyes: no periorbital swelling, no proptosis external nose and ears are normal mouth: no lesion seen NECK: supple, thyroid is not enlarged CHEST WALL: no deformity LUNGS: clear to auscultation CV: reg rate and rhythm, no murmur ABD: abdomen is soft, nontender.  no hepatosplenomegaly.  not distended.  no hernia MUSCULOSKELETAL: muscle bulk and strength are grossly normal.  no obvious joint swelling.  gait is steady with a cane.   EXTEMITIES: no deformity.  no ulcer on the feet.  feet are of normal color and temp.  1+ bilat leg edema.  PULSES: dorsalis pedis intact bilat.  no carotid bruit NEURO:  cn 2-12 grossly intact.   readily moves all 4's.  sensation is intact to touch on the feet, but decreased from normal.  SKIN:  Normal texture and temperature.  No rash or suspicious lesion is visible.   NODES:  None palpable at the neck PSYCH: alert, well-oriented.  Does not appear anxious nor depressed.    Lab Results  Component Value Date   HGBA1C 10.5 07/08/2017    Lab Results  Component Value Date   CREATININE 1.04 05/13/2016   BUN 10 05/13/2016   NA 138 05/13/2016   K 4.7 05/13/2016   CL 96 05/13/2016   CO2 19 05/13/2016   I have reviewed outside records, and  summarized: Pt was noted to have severely elevated a1c, and referred here.  Diet was noted to be poor.  Other med probs were addressed (HTN and dyslipidemia)  I personally reviewed electrocardiogram tracing (01/05/16): Indication: preop Impression: ST.  No MI.  No hypertrophy.  No comparison is available     Assessment & Plan:  Insulin-requiring type 2 DM, with polyneuropathy: severe exacerbation Muscle cramps, new to me: pt says due to novolog--we'll change to qd insulin  Patient Instructions  good diet and exercise significantly improve the control of your diabetes.  please let me know if you wish to be referred to a dietician.  high blood sugar is very risky to your health.  you should see an eye doctor and dentist every year.  It is very important to get all recommended vaccinations.  Controlling your blood pressure and cholesterol drastically reduces the damage diabetes does to your body.  Those who smoke should quit.  Please discuss these with your doctor.  check your blood sugar twice a day.  vary the time of day when you check, between before the 3 meals, and at bedtime.  also check if you have symptoms of your blood sugar being too high or too low.  please keep a record of the readings and bring it to your next appointment here (or you can bring the meter itself).  You can write it on any piece of paper.  please call us sooner if your blood sugar goes below 70, or if you have a lot of readings over 200. Here is a new meter.  I have sent a prescription to your pharmacy, for strips. For now, please: Stop taking the novolog, and: Increase the lantus to 120 units each morning, and: Please continue the same metformin.  Please call or message [place] next week, to tell us how the blood sugar is doing.  Please come back for a follow-up appointment in 6 weeks.

## 2017-09-04 NOTE — Patient Instructions (Addendum)
good diet and exercise significantly improve the control of your diabetes.  please let me know if you wish to be referred to a dietician.  high blood sugar is very risky to your health.  you should see an eye doctor and dentist every year.  It is very important to get all recommended vaccinations.  Controlling your blood pressure and cholesterol drastically reduces the damage diabetes does to your body.  Those who smoke should quit.  Please discuss these with your doctor.  check your blood sugar twice a day.  vary the time of day when you check, between before the 3 meals, and at bedtime.  also check if you have symptoms of your blood sugar being too high or too low.  please keep a record of the readings and bring it to your next appointment here (or you can bring the meter itself).  You can write it on any piece of paper.  please call us sooner if your blood sugar goes below 70, or if you have a lot of readings over 200. Here is a new meter.  I have sent a prescription to your pharmacy, for strips. For now, please: Stop taking the novolog, and: Increase the lantus to 120 units each morning, and: Please continue the same metformin.  Please call or message [place] next week, to tell us how the blood sugar is doing.  Please come back for a follow-up appointment in 6 weeks.

## 2017-09-04 NOTE — Progress Notes (Signed)
   Subjective:    Patient ID: Matthew Dougherty, male    DOB: [birth year], 42 y.o.   MRN: [id number]  HPI    Review of Systems     Objective:   Physical Exam        Assessment & Plan:

## 2017-09-09 ENCOUNTER — Telehealth: Payer: Self-pay | Admitting: Endocrinology

## 2017-09-09 ENCOUNTER — Other Ambulatory Visit: Payer: Self-pay

## 2017-09-09 MED ORDER — INSULIN GLARGINE 100 UNIT/ML SOLOSTAR PEN: 120.0000 [IU] | PEN_INJECTOR | SUBCUTANEOUS | 11 refills | Status: DC

## 2017-09-09 NOTE — Telephone Encounter (Signed)
Please rewrite the prescription for Insulin Glargine (LANTUS SOLOSTAR) 100 UNIT/ML Solostar Pen [[id number]] for a 1 months supply  Send to [place], [place] [address]

## 2017-09-09 NOTE — Telephone Encounter (Signed)
I have sent to pharmacy.

## 2017-10-16 ENCOUNTER — Ambulatory Visit (INDEPENDENT_AMBULATORY_CARE_PROVIDER_SITE_OTHER): Payer: BLUE CROSS/BLUE SHIELD | Admitting: Acute Care

## 2017-10-16 ENCOUNTER — Encounter: Payer: Self-pay | Admitting: Acute Care

## 2017-10-16 ENCOUNTER — Ambulatory Visit (INDEPENDENT_AMBULATORY_CARE_PROVIDER_SITE_OTHER)
Admission: RE | Admit: 2017-10-16 | Discharge: 2017-10-16 | Disposition: A | Discharge status: Home / Self Care | Payer: BLUE CROSS/BLUE SHIELD | Source: Ambulatory Visit | Attending: Acute Care | Admitting: Acute Care

## 2017-10-16 ENCOUNTER — Other Ambulatory Visit: Payer: Self-pay

## 2017-10-16 ENCOUNTER — Telehealth: Payer: Self-pay | Admitting: Endocrinology

## 2017-10-16 ENCOUNTER — Ambulatory Visit: Payer: BLUE CROSS/BLUE SHIELD | Admitting: Endocrinology

## 2017-10-16 VITALS — BP 120/80 | HR 108 | Wt 236.8 lb

## 2017-10-16 DIAGNOSIS — R0602 Shortness of breath: Secondary | ICD-10-CM | POA: Diagnosis not present

## 2017-10-16 DIAGNOSIS — R05 Cough: Secondary | ICD-10-CM | POA: Diagnosis not present

## 2017-10-16 DIAGNOSIS — R059 Cough, unspecified: Secondary | ICD-10-CM

## 2017-10-16 DIAGNOSIS — J449 Chronic obstructive pulmonary disease, unspecified: Secondary | ICD-10-CM | POA: Diagnosis not present

## 2017-10-16 MED ORDER — METFORMIN HCL 500 MG PO TABS: ORAL_TABLET | ORAL | 0 refills | Status: DC

## 2017-10-16 MED ORDER — PREDNISONE 10 MG PO TABS: ORAL_TABLET | ORAL | 0 refills | Status: DC

## 2017-10-16 MED ORDER — HYDROCODONE-HOMATROPINE 5-1.5 MG/5ML PO SYRP: 5.0000 mL | ORAL_SOLUTION | Freq: Four times a day (QID) | ORAL | 0 refills | Status: DC | PRN

## 2017-10-16 MED ORDER — DOXYCYCLINE HYCLATE 100 MG PO TABS: 100.0000 mg | ORAL_TABLET | Freq: Two times a day (BID) | ORAL | 0 refills | Status: DC

## 2017-10-16 NOTE — Telephone Encounter (Signed)
Refill for metformin,send to [place], [place]

## 2017-10-16 NOTE — Patient Instructions (Addendum)
It is good to see you today. You need to quit smoking completely. This is directly contributing to your frequent COPD flares. CXR today We will walk you today to see if you qualify for oxygen during the day. We will prescribe doxycycline 100 mg twice daily x 7 days. Prednisone taper; 10 mg tablets: 4 tabs x 2 days, 3 tabs x 2 days, 2 tabs x 2 days 1 tab x 2 days then stop. Zyrtec daily. Delsym cough syrup for cough Sips of water instead of throat clearing Sugar free jolly ranchers for throat soothing Hydromet cough syrup for bedtime 5 cc's at HS Continue wearing your oxygen at 2 L [place] at bedtime. Follow up with Dr. Melvyn Novas in 2 weeks Note your daily symptoms > remember "red flags" for COPD: Increase in cough, increase in sputum production, increase in shortness of breath or activity tolerance. If you notice these symptoms, please call to be seen. Please contact office for sooner follow up if symptoms do not improve

## 2017-10-16 NOTE — Progress Notes (Signed)
doxy 

## 2017-10-16 NOTE — Telephone Encounter (Signed)
I have refilled & sent to pharmacy.

## 2017-10-16 NOTE — Progress Notes (Signed)
History of Present Illness Matthew Dougherty is a 42 y.o. male current every day smoker with COPD. He is followed by Matthew Dougherty.  Brief patient profile:  40yowm active smoker  from [place] good ex tol as teenager though had dx of asthma starting around age 44-14 and able to play to football/ cross country and had inhaler but didn't use it much until around 2010 need for saba increased and around 2014 placed on maint rx with advair but not better with criteria for severe copd in 04/2014 referred to pulmonary clinic 02/16/2016 by Matthew Dougherty   10/16/2017  Acute OV: Pt. Presents for OV. He states he has had a cough with shortness of breath x the last 2 weeks. He states he has had fever this am and last night per his wife. He states the amount of sputum has increased over the last 2 weeks.Secretions are anywhere form thick yellow green to thin and clear. He states he has had increased SOB with this. He is continuing to smoke. He smells strongly of cigarette smoke today in the office.He is wheezing and he has coughing fits.He continues to cough until he passes out.He states he is compliant with his Symbicort daily and he is using the pulmocort nebs once daily. He is also using Spiriva Respimat once daily.He states his last flare was last month and he was treated with prednisone by his PCP. He denies chest pain, orthopnea, and hemoptysis.  Test Results: CXR>> 10/16/2017>> Bronchitic changes.No focal opacities or effusions  CBC Latest Ref Rng & Units 01/05/2016  WBC 4.0 - 10.5 K/uL 10.2  Hemoglobin 13.0 - 17.0 g/dL 17.0  Hematocrit 39.0 - 52.0 % 48.7  Platelets 150 - 400 K/uL 261    BMP Latest Ref Rng & Units 05/13/2016 01/16/2016 01/05/2016  Glucose 65 - 99 mg/dL 210(H) 195(H) 365(H)  BUN 6 - 24 mg/dL 10 10 14   Creatinine 0.76 - 1.27 mg/dL 1.04 0.80 0.71  BUN/Creat Ratio 9 - 20 10 13  -  Sodium 134 - 144 mmol/L 138 139 130(L)  Potassium 3.5 - 5.2 mmol/L 4.7 4.4 4.7  Chloride 96 - 106 mmol/L 96 98 98(L)   CO2 18 - 29 mmol/L 19 23 25   Calcium 8.7 - 10.2 mg/dL 9.3 9.2 9.6    BNP No results found for: BNP  ProBNP No results found for: PROBNP  PFT    Component Value Date/Time   FEV1PRE 1.49 03/29/2016 1002   FEV1POST 2.07 03/29/2016 1002   FVCPRE 2.62 03/29/2016 1002   FVCPOST 3.30 03/29/2016 1002   TLC 7.20 03/29/2016 1002   DLCOUNC 22.35 03/29/2016 1002   PREFEV1FVCRT 57 03/29/2016 1002   PSTFEV1FVCRT 63 03/29/2016 1002    Dg Chest 2 View  Result Date: 10/16/2017 CLINICAL DATA:  Cough, shortness of Breath EXAM: CHEST - 2 VIEW COMPARISON:  07/21/2017 FINDINGS: Peribronchial thickening. Heart and mediastinal contours are within normal limits. No focal opacities or effusions. No acute bony abnormality. IMPRESSION: Bronchitic changes. Electronically Signed   By: Matthew Dougherty M.D.   On: 10/16/2017 13:05     Past medical hx Past Medical History:  Diagnosis Date  . Arthritis   . Asthma   . Cancer ([place])    kidney  . Chronic kidney disease    partial removal of left kidney; cancer  . Chronic knee pain   . COPD (chronic obstructive pulmonary disease) ([place])   . Depression   . Diabetes mellitus without complication ([place])   . Gout   .  Hypertension   . Neuropathy   . Osteoarthritis   . Renal cancer ([place])    2010 left partial nephrectomy  . Restless leg syndrome   . Seizures ([place])    few years ago; states that he aspirated and had a seizure; no meds for seizures and none since then     Social History   Tobacco Use  . Smoking status: Current Every Day Smoker    Packs/day: 1.00    Years: 31.00    Pack years: 31.00    Types: Cigarettes  . Smokeless tobacco: Never Used  Substance Use Topics  . Alcohol use: Yes    Comment: occasional  . Drug use: No    Matthew Dougherty reports that he has been smoking cigarettes.  He has a 31.00 pack-year smoking history. He has never used smokeless tobacco. He reports that he drinks alcohol. He reports that he does not use drugs.  Tobacco  Cessation: Ready to quit: No Counseling given: Yes I have spent 3 minutes counseling patient on smoking cessation this visit.  Past surgical hx, Family hx, Social hx all reviewed.  Current Outpatient Medications on File Prior to Visit  Medication Sig  . albuterol (PROAIR HFA) 108 (90 Base) MCG/ACT inhaler Inhale 2 puffs into the lungs every 6 (six) hours as needed for wheezing or shortness of breath.  . budesonide (PULMICORT) 0.5 MG/2ML nebulizer solution   . budesonide-formoterol (SYMBICORT) 160-4.5 MCG/ACT inhaler Inhale 2 puffs into the lungs 2 (two) times daily.  . citalopram (CELEXA) 40 MG tablet Take 40 mg by mouth daily.  . diazepam (VALIUM) 5 MG tablet   . gabapentin (NEURONTIN) 300 MG capsule Take 600 mg by mouth 3 (three) times daily. Reported on 01/25/2016  . glucose blood (CONTOUR NEXT TEST) test strip 1 each by Other route 2 (two) times daily. And lancets 2/day  . HYDROcodone-acetaminophen (NORCO) 10-325 MG tablet   . Insulin Glargine (LANTUS SOLOSTAR) 100 UNIT/ML Solostar Pen Inject 120 Units into the skin every morning. And pen needles 1/day  . metFORMIN (GLUCOPHAGE) 500 MG tablet TAKE (1) TABLET TWICE A DAY WITH MEALS. (Patient taking differently: TAKE 2 TABLETS TWICE A DAY WITH MEALS.)  . metoprolol succinate (TOPROL-XL) 25 MG 24 hr tablet Take 25 mg by mouth daily.  . OXYGEN 2lpm with sleep only  [name]  . SPIRIVA RESPIMAT 2.5 MCG/ACT AERS INHALE 2 PUFFS DAILY.  Marland Kitchen Tiotropium Bromide Monohydrate (SPIRIVA RESPIMAT) 2.5 MCG/ACT AERS 2 puffs each am  . topiramate (TOPAMAX) 25 MG tablet 50 mg 2 (two) times daily.   . valsartan (DIOVAN) 80 MG tablet Take 1 tablet (80 mg total) by mouth daily.  . meloxicam (MOBIC) 7.5 MG tablet Take 7.5 mg by mouth daily.   No current facility-administered medications on file prior to visit.      Allergies  Allergen Reactions  . Penicillins     Review Of Systems:  Constitutional:   No  weight loss, night sweats,  +Fevers, chills,+  fatigue, or  lassitude.  HEENT:   No headaches,  Difficulty swallowing,  Tooth/dental problems, or  Sore throat,                No sneezing, itching, ear ache,+ nasal congestion,+ post nasal drip,   CV:  No chest pain,  Orthopnea, PND, swelling in lower extremities, anasarca, dizziness, palpitations, syncope.   GI  No heartburn, indigestion, abdominal pain, nausea, vomiting, diarrhea, change in bowel habits, loss of appetite, bloody stools.   Resp: + shortness of  breath with exertion or at rest.  + excess mucus, + productive cough,  No non-productive cough,  No coughing up of blood.  + change in color of mucus.  + wheezing.  No chest wall deformity  Skin: no rash or lesions.  GU: no dysuria, change in color of urine, no urgency or frequency.  No flank pain, no hematuria   MS:  No joint pain or swelling.  No decreased range of motion.  No back pain.  Psych:  No change in mood or affect. No depression or anxiety.  No memory loss.   Vital Signs BP 120/80 (BP Location: Left Arm, Cuff Size: Normal)   Pulse (!) 108   Wt 236 lb 12.8 oz (107.4 kg)   SpO2 97%   BMI 33.03 kg/m    Physical Exam:  General- No distress,  A&Ox3, Smells strongly of cigarette smoke ENT: No sinus tenderness, TM clear, pale nasal mucosa, no oral exudate,no post nasal drip, no LAN Cardiac: S1, S2, regular rate and rhythm, no murmur Chest: + wheeze/ rales/ dullness; no accessory muscle use, no nasal flaring, no sternal retractions Abd.: Soft Non-tender, ND, BS + Ext: No clubbing cyanosis, edema Neuro:  normal strength, MAE x 4, A&O x 3 Skin: No rashes, warm and dry Psych: normal mood and behavior   Assessment/Plan  COPD GOLD III/ still smoking with restrictive component  Flare Continues to smoke every day Smells strongly of cigarettes in the office today Plan: You need to quit smoking completely. This is directly contributing to your frequent COIPD flares. CXR today We will walk you today to see if  you qualify for oxygen during the day. We will prescribe doxycycline 100 mg twice daily x 7 days. Prednisone taper; 10 mg tablets: 4 tabs x 2 days, 3 tabs x 2 days, 2 tabs x 2 days 1 tab x 2 days then stop. Zyrtec daily. Delsym cough syrup for cough Sips of water instead of throat clearing Sugar free jolly ranchers for throat soothing Hydromet cough syrup for bedtime 5 cc's at HS Continue wearing your oxygen at 2 L [place] at bedtime. You did qualify for oxygen today so please wear it at 2 L [place]. Saturation goals are 88-92% Follow up with Matthew Dougherty in 2 weeks Note your daily symptoms > remember "red flags" for COPD: Increase in cough, increase in sputum production, increase in shortness of breath or activity tolerance. If you notice these symptoms, please call to be seen. Please contact office for sooner follow up if symptoms do not improve    Magdalen Spatz, NP 10/16/2017  4:35 PM

## 2017-10-16 NOTE — Assessment & Plan Note (Signed)
Flare Continues to smoke every day Smells strongly of cigarettes in the office today Plan: You need to quit smoking completely. This is directly contributing to your frequent COIPD flares. CXR today We will walk you today to see if you qualify for oxygen during the day. We will prescribe doxycycline 100 mg twice daily x 7 days. Prednisone taper; 10 mg tablets: 4 tabs x 2 days, 3 tabs x 2 days, 2 tabs x 2 days 1 tab x 2 days then stop. Zyrtec daily. Delsym cough syrup for cough Sips of water instead of throat clearing Sugar free jolly ranchers for throat soothing Hydromet cough syrup for bedtime 5 cc's at HS Continue wearing your oxygen at 2 L [place] at bedtime. You did qualify for oxygen today so please wear it at 2 L [place]. Saturation goals are 88-92% Follow up with Dr. Melvyn Novas in 2 weeks Note your daily symptoms > remember "red flags" for COPD: Increase in cough, increase in sputum production, increase in shortness of breath or activity tolerance. If you notice these symptoms, please call to be seen. Please contact office for sooner follow up if symptoms do not improve

## 2017-10-17 ENCOUNTER — Other Ambulatory Visit: Payer: Self-pay | Admitting: Internal Medicine

## 2017-10-17 MED ORDER — BUDESONIDE 0.5 MG/2ML IN SUSP: 0.5000 mg | Freq: Two times a day (BID) | RESPIRATORY_TRACT | 5 refills | Status: DC

## 2017-10-17 MED ORDER — BUDESONIDE-FORMOTEROL FUMARATE 160-4.5 MCG/ACT IN AERO
2.0000 | INHALATION_SPRAY | Freq: Two times a day (BID) | RESPIRATORY_TRACT | 5 refills | Status: DC
Start: 2017-10-17 — End: 2020-04-28

## 2017-10-17 MED ORDER — ALBUTEROL SULFATE HFA 108 (90 BASE) MCG/ACT IN AERS: 2.0000 | INHALATION_SPRAY | Freq: Four times a day (QID) | RESPIRATORY_TRACT | 1 refills | Status: DC | PRN

## 2017-10-20 ENCOUNTER — Telehealth: Payer: Self-pay | Admitting: Internal Medicine

## 2017-10-20 NOTE — Telephone Encounter (Signed)
CXR showed changes of bronchitis. The treatment of Doxycycline and pred taper  Prescribed that day are the appropriate treatment. Please have the patient follow the plan of care and follow up as specified. Thanks so much.

## 2017-10-20 NOTE — Telephone Encounter (Signed)
Spoke with pt. He is requesting his results from his CXR done on 10/16/17.  Judson Roch - please advise. Thanks.

## 2017-10-21 MED ORDER — PREDNISONE 10 MG PO TABS: ORAL_TABLET | ORAL | 0 refills | Status: DC

## 2017-10-21 NOTE — Telephone Encounter (Signed)
Called and spoke with patient, he is aware and verbalized understanding. Patient states that he is not currently smoking but his wife is. Medication sent to pharmacy.

## 2017-10-21 NOTE — Telephone Encounter (Signed)
Called and spoke with patient, he is aware of results and verbalized understanding. Patient states that he is still having SOB and is asking for a refill of the prednisone. SG is this ok to refill please advise, thanks.

## 2017-10-21 NOTE — Telephone Encounter (Signed)
OK to repeat pred taper Prednisone taper; 10 mg tablets: 4 tabs x 2 days, 3 tabs x 2 days, 2 tabs x 2 days 1 tab x 2 days then stop.  Please ask if he is continuing to smoke.He smelled strongly of smoke when in the office last week.  If he is please reinforce that he needs to stop smoking . He needs to follow up with Dr. Melvyn Novas as is scheduled. If he needs any additional medications or is not improving he will need to be seen in the office before any additional medications will be prescribed. Thanks so much.

## 2017-11-10 ENCOUNTER — Ambulatory Visit (INDEPENDENT_AMBULATORY_CARE_PROVIDER_SITE_OTHER): Payer: BLUE CROSS/BLUE SHIELD | Admitting: Endocrinology

## 2017-11-10 ENCOUNTER — Encounter: Payer: Self-pay | Admitting: Internal Medicine

## 2017-11-10 ENCOUNTER — Encounter: Payer: Self-pay | Admitting: Endocrinology

## 2017-11-10 ENCOUNTER — Ambulatory Visit (INDEPENDENT_AMBULATORY_CARE_PROVIDER_SITE_OTHER): Payer: BLUE CROSS/BLUE SHIELD | Admitting: Internal Medicine

## 2017-11-10 VITALS — BP 106/60 | HR 96 | Temp 98.4°F | Ht 71.0 in | Wt 238.0 lb

## 2017-11-10 VITALS — BP 118/68 | HR 100 | Wt 240.0 lb

## 2017-11-10 DIAGNOSIS — Z794 Long term (current) use of insulin: Secondary | ICD-10-CM | POA: Diagnosis not present

## 2017-11-10 DIAGNOSIS — J449 Chronic obstructive pulmonary disease, unspecified: Secondary | ICD-10-CM | POA: Diagnosis not present

## 2017-11-10 DIAGNOSIS — E1165 Type 2 diabetes mellitus with hyperglycemia: Secondary | ICD-10-CM | POA: Diagnosis not present

## 2017-11-10 DIAGNOSIS — J9611 Chronic respiratory failure with hypoxia: Secondary | ICD-10-CM | POA: Diagnosis not present

## 2017-11-10 DIAGNOSIS — F17200 Nicotine dependence, unspecified, uncomplicated: Secondary | ICD-10-CM

## 2017-11-10 DIAGNOSIS — F17210 Nicotine dependence, cigarettes, uncomplicated: Secondary | ICD-10-CM | POA: Diagnosis not present

## 2017-11-10 DIAGNOSIS — E118 Type 2 diabetes mellitus with unspecified complications: Secondary | ICD-10-CM

## 2017-11-10 DIAGNOSIS — IMO0002 Reserved for concepts with insufficient information to code with codable children: Secondary | ICD-10-CM

## 2017-11-10 LAB — POCT GLYCOSYLATED HEMOGLOBIN (HGB A1C): Hemoglobin A1C: 10.4

## 2017-11-10 MED ORDER — DOXYCYCLINE HYCLATE 100 MG PO TABS: 100.0000 mg | ORAL_TABLET | Freq: Two times a day (BID) | ORAL | 0 refills | Status: DC

## 2017-11-10 NOTE — Patient Instructions (Addendum)
Please continue the same insulin for now. check your blood sugar twice a day.  vary the time of day when you check, between before the 3 meals, and at bedtime.  also check if you have symptoms of your blood sugar being too high or too low.  please keep a record of the readings and bring it to your next appointment here (or you can bring the meter itself).  You can write it on any piece of paper.  please call us sooner if your blood sugar goes below 70, or if you have a lot of readings over 200. Please call or message [place] next week, to tell us how the blood sugar is doing.   If it is still much lower at night, we may need to change to a faster-acting insulin.  Please come back for a follow-up appointment in 2 months.

## 2017-11-10 NOTE — Progress Notes (Signed)
Subjective:    Patient ID: Matthew Dougherty, male    DOB: 07/18/1975, 42 y.o.   MRN: [id number]  HPI  Pt returns for f/u of diabetes mellitus: DM type: Insulin-requiring type 2.   Dx'ed: 5465 Complications: polyneuropathy Therapy: insulin since 2009 DKA: never Severe hypoglycemia: never Pancreatitis: never Pancreatic imaging: never Other: he did not tolerate novolog (cramps) Interval history: He says cbg varies from 50-400.  He took prednisone x 1 month for AB--he finished it last week.  He says he needs more time to see how cbg's are off it.  pt states he feels better in general.  Past Medical History:  Diagnosis Date  . Arthritis   . Asthma   . Cancer ([place])    kidney  . Chronic kidney disease    partial removal of left kidney; cancer  . Chronic knee pain   . COPD (chronic obstructive pulmonary disease) ([place])   . Depression   . Diabetes mellitus without complication ([place])   . Gout   . Hypertension   . Neuropathy   . Osteoarthritis   . Renal cancer ([place])    2010 left partial nephrectomy  . Restless leg syndrome   . Seizures ([place])    few years ago; states that he aspirated and had a seizure; no meds for seizures and none since then    Past Surgical History:  Procedure Laterality Date  . KNEE SURGERY Left    as child; removal of straight pin from under knee cap.  Marland Kitchen PARTIAL NEPHRECTOMY Left   . pinched nerves in back    . SEPTOPLASTY      Social History   Socioeconomic History  . Marital status: Unknown    Spouse name: Not on file  . Number of children: Not on file  . Years of education: Not on file  . Highest education level: Not on file  Occupational History  . Not on file  Social Needs  . Financial resource strain: Not on file  . Food insecurity:    Worry: Not on file    Inability: Not on file  . Transportation needs:    Medical: Not on file    Non-medical: Not on file  Tobacco Use  . Smoking status: Current Every Day Smoker    Packs/day: 1.00    Years: 31.00    Pack years: 31.00    Types: Cigarettes  . Smokeless tobacco: Never Used  Substance and Sexual Activity  . Alcohol use: Yes    Comment: occasional  . Drug use: No  . Sexual activity: Yes  Lifestyle  . Physical activity:    Days per week: Not on file    Minutes per session: Not on file  . Stress: Not on file  Relationships  . Social connections:    Talks on phone: Not on file    Gets together: Not on file    Attends religious service: Not on file    Active member of club or organization: Not on file    Attends meetings of clubs or organizations: Not on file    Relationship status: Not on file  . Intimate partner violence:    Fear of current or ex partner: Not on file    Emotionally abused: Not on file    Physically abused: Not on file    Forced sexual activity: Not on file  Other Topics Concern  . Not on file  Social History Narrative   Works as a Dealer, Careers adviser  Current Outpatient Medications on File Prior to Visit  Medication Sig Dispense Refill  . albuterol (PROAIR HFA) 108 (90 Base) MCG/ACT inhaler Inhale 2 puffs into the lungs every 6 (six) hours as needed for wheezing or shortness of breath. 1 Inhaler 1  . budesonide-formoterol (SYMBICORT) 160-4.5 MCG/ACT inhaler Inhale 2 puffs into the lungs 2 (two) times daily. 10.2 g 5  . citalopram (CELEXA) 40 MG tablet Take 40 mg by mouth daily.    . diazepam (VALIUM) 5 MG tablet   0  . gabapentin (NEURONTIN) 300 MG capsule Take 600 mg by mouth 3 (three) times daily. Reported on 01/25/2016    . glucose blood (CONTOUR NEXT TEST) test strip 1 each by Other route 2 (two) times daily. And lancets 2/day 100 each 12  . HYDROcodone-acetaminophen (NORCO) 10-325 MG tablet   0  . Insulin Glargine (LANTUS SOLOSTAR) 100 UNIT/ML Solostar Pen Inject 120 Units into the skin every morning. And pen needles 1/day 15 pen 11  . metFORMIN (GLUCOPHAGE) 500 MG tablet TAKE (1) TABLET TWICE A DAY WITH MEALS. 60 tablet 0  .  metoprolol succinate (TOPROL-XL) 25 MG 24 hr tablet Take 25 mg by mouth daily.    . OXYGEN 2lpm with sleep and occ with exertion [name]    . SPIRIVA RESPIMAT 2.5 MCG/ACT AERS INHALE 2 PUFFS DAILY. 1 Inhaler 5  . topiramate (TOPAMAX) 25 MG tablet 50 mg 2 (two) times daily.   0  . valsartan (DIOVAN) 80 MG tablet Take 1 tablet (80 mg total) by mouth daily. 30 tablet 11   No current facility-administered medications on file prior to visit.     Allergies  Allergen Reactions  . Penicillins     Family History  Problem Relation Age of Onset  . Breast cancer Mother   . Melanoma Father   . Diabetes Sister     BP 118/68   Pulse 100   Wt 240 lb (108.9 kg)   SpO2 91%   BMI 33.47 kg/m   Review of Systems Denies LOC.      Objective:   Physical Exam VITAL SIGNS:  See vs page GENERAL: no distress Pulses: dorsalis pedis intact bilat.   MSK: no deformity of the feet CV: trace bilat leg leg edema Skin:  no ulcer on the feet.  normal color and temp on the feet. Neuro: sensation is intact to touch on the feet.    Lab Results  Component Value Date   CREATININE 1.04 05/13/2016   BUN 10 05/13/2016   NA 138 05/13/2016   K 4.7 05/13/2016   CL 96 05/13/2016   CO2 19 05/13/2016    Lab Results  Component Value Date   HGBA1C 10.4 11/10/2017       Assessment & Plan:  Insulin-requiring type 2 DM.  AB, new to me.  Steroid rx is affecting a1c, so we need more time off it to see how glycemic control is.    Patient Instructions  Please continue the same insulin for now. check your blood sugar twice a day.  vary the time of day when you check, between before the 3 meals, and at bedtime.  also check if you have symptoms of your blood sugar being too high or too low.  please keep a record of the readings and bring it to your next appointment here (or you can bring the meter itself).  You can write it on any piece of paper.  please call us sooner if your blood sugar goes  below 70, or if you  have a lot of readings over 200. Please call or message [place] next week, to tell us how the blood sugar is doing.   If it is still much lower at night, we may need to change to a faster-acting insulin.  Please come back for a follow-up appointment in 2 months.

## 2017-11-10 NOTE — Patient Instructions (Addendum)
Goal is to keep the saturations above 90% during the day with exertion   Plan A = Automatic = symbicort 160 x 2 followed by spiriva first thing in am and then 12 hours repeat the symbicort  Work on inhaler technique:  relax and gently blow all the way out then take a nice smooth deep breath back in, triggering the inhaler at same time you start breathing in.  Hold for up to 5 seconds if you can. Blow out thru nose. Rinse and gargle with water when done     Plan B = Backup Only use your albuterol as a rescue medication to be used if you can't catch your breath by resting or doing a relaxed purse lip breathing pattern.  - The less you use it, the better it will work when you need it. - Ok to use the inhaler up to 2 puffs  every 4 hours if you must but call for appointment if use goes up over your usual need - Don't leave home without it !!  (think of it like the spare tire for your car)     Plan C = Crisis - only use your albuterol nebulizer if you first try Plan B and it fails to help > ok to use the nebulizer up to every 4 hours but if start needing it regularly call for immediate appointment   Work on inhaler technique:  relax and gently blow all the way out then take a nice smooth deep breath back in, triggering the inhaler at same time you start breathing in.  Hold for up to 5 seconds if you can. Blow out thru nose. Rinse and gargle with water when done     Doxycylcine 100 mg twice daily x 10 days take before eating  The key is to stop smoking completely before smoking completely stops you!   Please schedule a follow up office visit in 6 weeks, call sooner if needed with all medications /inhalers/ solutions in hand so we can verify exactly what you are taking. This includes all medications from all doctors and over the counters

## 2017-11-10 NOTE — Progress Notes (Signed)
Subjective:    Patient ID: Matthew Dougherty, male    DOB: 11/06/1975,    MRN: [id number]    Brief patient profile:  41yowm active smoker  from [place] good ex tol as teenager though had dx of asthma starting around age 44-14 and able to play to football/ cross country and had inhaler but didn't use it much until around 2010 need for saba increased and around 2014 placed on maint rx with advair but not better with criteria for severe copd in 04/2014 referred to pulmonary clinic 02/16/2016 by Dr Woody Seller      History of Present Illness  02/16/2016 1st [facility] office visit/ Matthew Dougherty  maint rx advair hfa  spiriva dpi / acei Chief Complaint  Patient presents with  . Pulmonary Consult    Referred by Dr. Woody Seller. Pt states dxed with COPD in 2015. He c/o increased cough and SOB over the past year. He states that some days he gets winded just walking around his house. His cough is prod with clear sputum and tends to be worse first thing in the am and then at night. He states that his symptoms can flare when exposed to certain smells and also with weather change.   placed on acei x 10 y and cough worse x around 2 years prior to OV  - no classic pleuritic or ex cp Doe = MMRC4  = sob if tries to leave home or while getting dressed   Some better with saba rec Plan A = Automatic = Advair 115 Take 2 puffs first thing in am and then another 2 puffs about 12 hours later.                                      Spiriva respimat 2 pffs each am  Plan B = Backup Only use your albuterol as a rescue medication to be used if you can't catch your breath by resting or doing a relaxed purse lip breathing pattern.  Stop lisinopril and start diovan (valsartan) 80 mg daily  The key is to stop smoking completely before smoking completely stops you - at least cut down if you can.       03/29/2016  f/u ov/Matthew Dougherty re: COPD III/ AB /still smoking on advair /spiriva respimat still actively smoking  Chief Complaint  Patient  presents with  . Follow-up    PFT's done today. He still smokes 1 ppd. He states that he is interested in quitting. His breathing is unchanged since the last visit.   cough is better, amble to walk full wm = MMRC2 = can't walk a nl pace on a flat grade s sob but does fine slow and flat  Only using albuterol when over does it  rec Stop advair and symbicort 160 Take 2 puffs first thing in am and then another 2 puffs about 12 hours later.  Continue spiriva 2puff each am  Call me if problems acquiring spiriva  The key is to stop smoking completely before smoking completely stops you - it's not too late     01/10/2017  f/u ov/Matthew Dougherty re: Copd III / AB  / still actively smoking / ran out of symb/spiriva ? when Chief Complaint  Patient presents with  . Follow-up    Breathing has been worse "since the last time I had a lung infection"- march 2018. He continues to smoke.   mucus is  clear now after stirs in am doesn't wake him Last albuterol  4.5h and much more use since ran out of maint rx He didn't realize he could get symb for free Doe = MMRC3 = can't walk 100 yards even at a slow pace at a flat grade s stopping due to sob  RA rec Plan A = Automatic = symbicort 160 / spiriva respimat Take 2 puffs first thing in am and then another 2 puffs of just the symbicort about 12 hours later.  Plan B = Backup Only use your albuterol  Plan C = Crisis - only use your albuterol nebulizer if you first try Plan B  The key is to stop smoking completely before smoking completely stops you!    11/10/2017  f/u ov/Matthew Dougherty re: copd III/ AB plus MO and still smoking on symb 160/ spiriva maint rx Chief Complaint  Patient presents with  . Follow-up    Breathing is unchanged since the last visit. He states he is having alot of sinus pressure and PND and he feels this is what is causing his problems. He has some prod cough with yellow sputum.    Dyspnea:  Has not tried to walk on 02 yet or checked sats with activity on 02  / previously as low as 85% reported Cough: more x dec 2018 assoc with nasal congestion better on doxy in past  Sleep: with concentrator x 3 years  SABA use:  avg two x daily  But also on alb neb with pulmocort    No obvious day to day or daytime variability or assoc excess/ purulent sputum or mucus plugs or hemoptysis or cp or chest tightness, subjective wheeze or overt sinus or hb symptoms. No unusual exposure hx or h/o childhood pna/ asthma or knowledge of premature birth.  Sleeping  2 pillows/ 2lpm 02  without nocturnal  or early am exacerbation  of respiratory  c/o's or need for noct saba. Also denies any obvious fluctuation of symptoms with weather or environmental changes or other aggravating or alleviating factors except as outlined above   Current Allergies, Complete Past Medical History, Past Surgical History, Family History, and Social History were reviewed in [employer] record.  ROS  The following are not active complaints unless bolded Hoarseness, sore throat, dysphagia, dental problems, itching, sneezing,  nasal congestion or discharge of excess mucus or purulent secretions, ear ache,   fever, chills, sweats, unintended wt loss or wt gain, classically pleuritic or exertional cp,  orthopnea pnd or arm/hand swelling  or leg swelling, presyncope, palpitations, abdominal pain, anorexia, nausea, vomiting, diarrhea  or change in bowel habits or change in bladder habits, change in stools or change in urine, dysuria, hematuria,  rash, arthralgias, visual complaints, headache, numbness, weakness or ataxia or problems with walking or coordination,  change in mood or  memory.        Current Meds  Medication Sig  . albuterol (PROAIR HFA) 108 (90 Base) MCG/ACT inhaler Inhale 2 puffs into the lungs every 6 (six) hours as needed for wheezing or shortness of breath.  . budesonide-formoterol (SYMBICORT) 160-4.5 MCG/ACT inhaler Inhale 2 puffs into the lungs 2 (two) times daily.    . citalopram (CELEXA) 40 MG tablet Take 40 mg by mouth daily.  . diazepam (VALIUM) 5 MG tablet   . gabapentin (NEURONTIN) 300 MG capsule Take 600 mg by mouth 3 (three) times daily. Reported on 01/25/2016  . glucose blood (CONTOUR NEXT TEST) test strip 1 each by  Other route 2 (two) times daily. And lancets 2/day  . HYDROcodone-acetaminophen (NORCO) 10-325 MG tablet   . Insulin Glargine (LANTUS SOLOSTAR) 100 UNIT/ML Solostar Pen Inject 120 Units into the skin every morning. And pen needles 1/day  . metFORMIN (GLUCOPHAGE) 500 MG tablet TAKE (1) TABLET TWICE A DAY WITH MEALS.  . metoprolol succinate (TOPROL-XL) 25 MG 24 hr tablet Take 25 mg by mouth daily.  . OXYGEN 2lpm with sleep and occ with exertion [name]  . SPIRIVA RESPIMAT 2.5 MCG/ACT AERS INHALE 2 PUFFS DAILY.  Marland Kitchen topiramate (TOPAMAX) 25 MG tablet 50 mg 2 (two) times daily.   . valsartan (DIOVAN) 80 MG tablet Take 1 tablet (80 mg total) by mouth daily.  .                                    Objective:   Physical Exam  amb obese wm with smoker's cough on voluntary maneuver   11/10/2017         238   01/10/2017        244  06/28/2016     249  03/29/2016       239  02/16/16 226 lb (102.5 kg)  02/08/16 229 lb 6 oz (104 kg)  01/25/16 222 lb (100.7 kg)     Vital signs reviewed - Note on arrival 02 sats  100% on 2lpm   And 91% on RA    HEENT: nl dentition / oropharynx. Nl external ear canals without cough reflex - moderate bilateral non-specific turbinate edema     NECK :  without JVD/Nodes/TM/ nl carotid upstrokes bilaterally   LUNGS: no acc muscle use,  Midd barrel  contour chest wall with bilateral  Distant  insp and exp rhonchi and  without cough on insp or exp maneuver and mod   Hyperresonant  to  percussion bilaterally     CV:  RRR  no s3 or murmur or increase in P2, and no edema   ABD: obese  soft and nontender with pos mid insp Hoover's  in the supine position. No bruits or organomegaly appreciated, bowel  sounds nl  MS:   Nl gait/  ext warm without deformities, calf tenderness, cyanosis or clubbing No obvious joint restrictions   SKIN: warm and dry without lesions    NEURO:  alert, approp, nl sensorium with  no motor or cerebellar deficits apparent.               I personally reviewed images and agree with radiology impression as follows:  CXR:   10/16/17 Bronchitic changes.       Assessment & Plan:

## 2017-11-11 ENCOUNTER — Encounter: Payer: Self-pay | Admitting: Internal Medicine

## 2017-11-11 NOTE — Assessment & Plan Note (Signed)
4-5 min discussion re active cigarette smoking in addition to office E&M  Ask about tobacco use:  Still usin  Advise quitting  done Assess willingness not ready yet Assist in quit attempt if truly committed  Arrange follow up. Follow up per Primary Care planned

## 2017-11-11 NOTE — Assessment & Plan Note (Signed)
Place on 02 x around  2016 by ENT doctor based on sleep study and reported to pulmonary 01/10/2017   - 11/10/2017  Walked RA x 3 laps @ 185 ft each stopped due to  desats to 88% resolved on 2lpm   As of 11/10/2017 continue 02 2lpm hs and with walking, not needed at rest and room to room at home

## 2017-11-11 NOTE — Assessment & Plan Note (Addendum)
PFTs 05/06/14   FEV1  1.12 (25%) ratio 65 with 23% improvement p saba in SVC and dlco 62% corrects to 102%  FEV1/SVC = 46%   - 02/16/2016 changed spiriva to respimat/ d/c'd acei  - alpha one screen 02/16/2016 >  MM - PFT's  03/29/2016  FEV1 2.07 (48 % ) ratio 63  p 39 % improvement from saba p nothing prior to study with DLCO  66 % corrects to 102  % for alv volume   - 03/29/2016   try symb 160 2bid instead of advair   11/10/2017  After extensive coaching inhaler device  effectiveness =    90% from baseline of 75%    Improving activity tol with intentional wt loss despite still smoking  Not clear why needs neb at this point but could consider complete shift over to laba/lama/ics if he prefers at some point (he does not for now) if not able to use hfa effectively or dpi makes him cough   I had an extended discussion with the patient reviewing all relevant studies completed to date and  lasting 15 to 20 minutes of a 25 minute visit    See device teaching which extended face to face time for this visit   Each maintenance medication was reviewed in detail including most importantly the difference between maintenance and prns and under what circumstances the prns are to be triggered using an action plan format that is not reflected in the computer generated alphabetically organized AVS.    Please see AVS for specific instructions unique to this visit that I personally wrote and verbalized to the the pt in detail and then reviewed with pt  by my nurse highlighting any  changes in therapy recommended at today's visit to their plan of care.

## 2017-11-18 ENCOUNTER — Other Ambulatory Visit: Payer: Self-pay | Admitting: Internal Medicine

## 2017-11-18 ENCOUNTER — Other Ambulatory Visit: Payer: Self-pay | Admitting: Endocrinology

## 2017-11-28 ENCOUNTER — Telehealth: Payer: Self-pay | Admitting: Endocrinology

## 2017-11-28 NOTE — Telephone Encounter (Signed)
Patient stated he is wanting to call to give report of his blood sugars  Please advise [phone]

## 2017-11-28 NOTE — Telephone Encounter (Signed)
I tried to call patient to get BS readings, but no VM box was set up.

## 2017-12-04 ENCOUNTER — Encounter (HOSPITAL_COMMUNITY): Payer: Self-pay

## 2017-12-04 ENCOUNTER — Other Ambulatory Visit: Payer: Self-pay

## 2017-12-04 ENCOUNTER — Emergency Department (HOSPITAL_COMMUNITY)
Admission: EM | Admit: 2017-12-04 | Discharge: 2017-12-05 | Disposition: A | Discharge status: Home / Self Care | Payer: BLUE CROSS/BLUE SHIELD | Attending: Emergency Medicine | Admitting: Emergency Medicine

## 2017-12-04 ENCOUNTER — Emergency Department (HOSPITAL_COMMUNITY): Payer: BLUE CROSS/BLUE SHIELD

## 2017-12-04 DIAGNOSIS — E1122 Type 2 diabetes mellitus with diabetic chronic kidney disease: Secondary | ICD-10-CM | POA: Insufficient documentation

## 2017-12-04 DIAGNOSIS — J449 Chronic obstructive pulmonary disease, unspecified: Secondary | ICD-10-CM | POA: Diagnosis not present

## 2017-12-04 DIAGNOSIS — R0789 Other chest pain: Secondary | ICD-10-CM

## 2017-12-04 DIAGNOSIS — N189 Chronic kidney disease, unspecified: Secondary | ICD-10-CM | POA: Diagnosis not present

## 2017-12-04 DIAGNOSIS — Z79899 Other long term (current) drug therapy: Secondary | ICD-10-CM | POA: Insufficient documentation

## 2017-12-04 DIAGNOSIS — M79644 Pain in right finger(s): Secondary | ICD-10-CM | POA: Insufficient documentation

## 2017-12-04 DIAGNOSIS — I129 Hypertensive chronic kidney disease with stage 1 through stage 4 chronic kidney disease, or unspecified chronic kidney disease: Secondary | ICD-10-CM | POA: Insufficient documentation

## 2017-12-04 DIAGNOSIS — Z794 Long term (current) use of insulin: Secondary | ICD-10-CM | POA: Insufficient documentation

## 2017-12-04 DIAGNOSIS — Z85528 Personal history of other malignant neoplasm of kidney: Secondary | ICD-10-CM | POA: Insufficient documentation

## 2017-12-04 DIAGNOSIS — R079 Chest pain, unspecified: Secondary | ICD-10-CM | POA: Diagnosis present

## 2017-12-04 DIAGNOSIS — F17210 Nicotine dependence, cigarettes, uncomplicated: Secondary | ICD-10-CM | POA: Insufficient documentation

## 2017-12-04 MED ORDER — KETOROLAC TROMETHAMINE 30 MG/ML IJ SOLN
30.0000 mg | Freq: Once | INTRAMUSCULAR | Status: AC
  Administered 2017-12-05: 30 mg via INTRAMUSCULAR
  Filled 2017-12-04: qty 1

## 2017-12-04 NOTE — ED Triage Notes (Signed)
I woke up today with my right side hurting.  It hurts to breath, move, cough per pt.  I also injured my right 5 th finger and popped it out of socket, I put it back myself, but I need it checked also per pt.

## 2017-12-04 NOTE — ED Notes (Signed)
Gave EKG to Dr.Horton 

## 2017-12-04 NOTE — ED Provider Notes (Signed)
[facility] EMERGENCY DEPARTMENT Provider Note   CSN: [id number] Arrival date & time: 12/04/17  2242     History   Chief Complaint Chief Complaint  Patient presents with  . Flank Pain    HPI Matthew Dougherty is a 42 y.o. male.  HPI  This is a 42 year old male with a history of COPD, diabetes, hypertension, chronic kidney disease who presents with right lower chest wall pain.  Patient reports onset of symptoms this morning.  He states that he went to bed normally last night.  However, woke up this morning with sharp right-sided chest wall pain.  He rates his pain at 10 out of 10.  He took a Ambulance person with no relief.  He states the pain is worse when he moves, coughs, breathes.  He has not noted any worsening of pain with food intake.  Denies any fevers, shortness of breath.  Does report that he had some dysuria but this was prior to onset of pain.  Denies any lower extremity swelling, history of blood clots.  Additionally he is concerned that he may have injured his right fifth digit.  He states 3 to 4 days ago he injured his right fifth digit and believes he dislocated it.  He "relocated it myself" but has continued pain and decreased range of motion.  Past Medical History:  Diagnosis Date  . Arthritis   . Asthma   . Cancer ([place])    kidney  . Chronic kidney disease    partial removal of left kidney; cancer  . Chronic knee pain   . COPD (chronic obstructive pulmonary disease) ([place])   . Depression   . Diabetes mellitus without complication ([place])   . Gout   . Hypertension   . Neuropathy   . Osteoarthritis   . 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 Restless leg syndrome   . Seizures ([place])    few years ago; states that he aspirated and had a seizure; no meds for seizures and none since then    Patient Active Problem List   Diagnosis Date Noted  . Chronic respiratory failure with hypoxia (HCC)/ nocturnal 02 only  01/10/2017  . Uncontrolled type 2 diabetes  mellitus with complication, with long-term current use of insulin ([place]) 05/16/2016  . Current smoker 02/18/2016  . COPD GOLD III/ still smoking with restrictive component  02/16/2016  . Morbid obesity due to excess calories ([place]) 01/25/2016  . Personal history of noncompliance with medical treatment, presenting hazards to health 01/25/2016  . Essential hypertension 10/18/2015    Past Surgical History:  Procedure Laterality Date  . KNEE SURGERY Left    as child; removal of straight pin from under knee cap.  Marland Kitchen PARTIAL NEPHRECTOMY Left   . pinched nerves in back    . SEPTOPLASTY          Home Medications    Prior to Admission medications   Medication Sig Start Date End Date Taking? Authorizing Provider  albuterol (PROAIR HFA) 108 (90 Base) MCG/ACT inhaler Inhale 2 puffs into the lungs every 6 (six) hours as needed for wheezing or shortness of breath. 10/17/17  Yes Tanda Rockers, MD  budesonide-formoterol (SYMBICORT) 160-4.5 MCG/ACT inhaler Inhale 2 puffs into the lungs 2 (two) times daily. 10/17/17   Tanda Rockers, MD  citalopram (CELEXA) 40 MG tablet Take 40 mg by mouth daily.    [provider]  diazepam (VALIUM) 5 MG tablet  08/09/17   [provider]  doxycycline (VIBRA-TABS) 100 MG tablet Take 1 tablet (100 mg total) by mouth 2 (two) times daily. 11/10/17   Tanda Rockers, MD  gabapentin (NEURONTIN) 300 MG capsule Take 600 mg by mouth 3 (three) times daily. Reported on 01/25/2016    [provider]  glucose blood (CONTOUR NEXT TEST) test strip 1 each by Other route 2 (two) times daily. And lancets 2/day 09/04/17   Renato Shin, MD  HYDROcodone-acetaminophen [facility]) 10-325 MG tablet  08/26/17   [provider]  Insulin Glargine (LANTUS SOLOSTAR) 100 UNIT/ML Solostar Pen Inject 120 Units into the skin every morning. And pen needles 1/day 09/09/17   Renato Shin, MD  metFORMIN (GLUCOPHAGE) 500 MG tablet TAKE 1 TABLET BY MOUTH 2 TIMES A DAY WITH MEALS.  11/18/17   Renato Shin, MD  metoprolol succinate (TOPROL-XL) 25 MG 24 hr tablet Take 25 mg by mouth daily.    [provider]  naproxen (NAPROSYN) 500 MG tablet Take 1 tablet (500 mg total) by mouth 2 (two) times daily. 12/05/17   Wisdom Seybold, Barbette Hair, MD  OXYGEN 2lpm with sleep and occ with exertion [name]    [provider]  SPIRIVA RESPIMAT 2.5 MCG/ACT AERS INHALE 2 PUFFS ONCE DAILY. 11/18/17   Tanda Rockers, MD  topiramate (TOPAMAX) 25 MG tablet 50 mg 2 (two) times daily.  07/28/17   [provider]  valsartan (DIOVAN) 80 MG tablet Take 1 tablet (80 mg total) by mouth daily. 02/16/16   Tanda Rockers, MD    Family History Family History  Problem Relation Age of Onset  . Breast cancer Mother   . Melanoma Father   . Diabetes Sister     Social History Social History   Tobacco Use  . Smoking status: Current Every Day Smoker    Packs/day: 1.00    Years: 31.00    Pack years: 31.00    Types: Cigarettes  . Smokeless tobacco: Never Used  Substance Use Topics  . Alcohol use: Yes    Comment: occasional  . Drug use: No     Allergies   Penicillins   Review of Systems Review of Systems  Constitutional: Negative for fever.  Respiratory: Negative for shortness of breath.   Cardiovascular: Positive for chest pain.  Gastrointestinal: Negative for abdominal pain, nausea and vomiting.  Genitourinary: Positive for dysuria.  Musculoskeletal:       Finger pain  Skin: Negative for rash.  All other systems reviewed and are negative.    Physical Exam Updated Vital Signs BP 120/80   Pulse 88   Temp 98.5 F (36.9 C) (Oral)   Resp (!) 26   Ht 5\' 11"  (1.803 m)   Wt 108 kg (238 lb)   SpO2 94%   BMI 33.19 kg/m   Physical Exam  Constitutional: He is oriented to person, place, and time. He appears well-developed and well-nourished.  Disheveled appearing  HENT:  Head: Normocephalic and atraumatic.  Eyes: Pupils are equal, round, and reactive to light.    Neck: Neck supple.  Cardiovascular: Normal rate, regular rhythm and normal heart sounds.  No murmur heard. Tenderness to palpation right lower chest wall without crepitus  Pulmonary/Chest: Effort normal and breath sounds normal. No respiratory distress. He has no wheezes.  Abdominal: Soft. Bowel sounds are normal. There is no tenderness. There is no rebound.  No CVA tenderness  Musculoskeletal: He exhibits no edema.  Neurological: He is alert and oriented to person, place, and time.  Skin:  Skin is warm and dry. No rash noted.  Psychiatric: He has a normal mood and affect.  Nursing note and vitals reviewed.    ED Treatments / Results  Labs (all labs ordered are listed, but only abnormal results are displayed) Labs Reviewed  URINALYSIS, ROUTINE W REFLEX MICROSCOPIC - Abnormal; Notable for the following components:      Result Value   Glucose, UA >=500 (*)    All other components within normal limits  TROPONIN I    EKG EKG Interpretation  Date/Time:  Thursday Dec 04 2017 23:44:18 EDT Ventricular Rate:  96 PR Interval:    QRS Duration: 96 QT Interval:  363 QTC Calculation: 459 R Axis:   82 Text Interpretation:  Sinus rhythm Consider left atrial enlargement ST elev, probable normal early repol pattern Baseline wander Confirmed by Thayer Jew [phone]) on 12/05/2017 12:47:44 AM   Radiology Dg Chest 2 View  Result Date: 12/05/2017 CLINICAL DATA:  42 y/o M; right lower chest pain, shortness of breath, cough. EXAM: CHEST - 2 VIEW COMPARISON:  11/15/2017 chest radiograph FINDINGS: Normal cardiac silhouette. Stable mild chronic bronchitic changes in the lung bases. No focal consolidation, effusion, or pneumothorax. No acute osseous abnormality is evident. IMPRESSION: Stable mild chronic bronchitic changes in the lung bases. No focal consolidation. Electronically Signed   By: Kristine Garbe M.D.   On: 12/05/2017 00:48   Dg Finger Little Right  Result Date:  12/05/2017 CLINICAL DATA:  Jammed little finger EXAM: RIGHT LITTLE FINGER 2+V COMPARISON:  None. FINDINGS: No fracture or dislocation is seen. The joint spaces are preserved. The visualized soft tissues are unremarkable. IMPRESSION: Negative. Electronically Signed   By: Julian Hy M.D.   On: 12/05/2017 00:45    Procedures Procedures (including critical care time)  Medications Ordered in ED Medications  ketorolac (TORADOL) 30 MG/ML injection 30 mg (30 mg Intramuscular Given 12/05/17 0038)  HYDROcodone-acetaminophen (NORCO/VICODIN) 5-325 MG per tablet 1 tablet (1 tablet Oral Given 12/05/17 0104)     Initial Impression / Assessment and Plan / ED Course  I have reviewed the triage vital signs and the nursing notes.  Pertinent labs & imaging results that were available during my care of the patient were reviewed by me and considered in my medical decision making (see chart for details).     Patient presents with right lower chest pain.  History is most suggestive of muscular skeletal etiology.  He has reproducible pain on exam.  Less suspicious for pneumonia or PE.  He is PERC neg. EKG shows no evidence of ischemia.  Chest x-ray shows no evidence of pneumothorax or pneumonia and was reviewed by myself.  Urinalysis showed no evidence of urinary tract infection.  Patient was given Toradol.  Patient states that that did not help.  He was subsequently given Norco.  Given that he has narcotic medication at home, will recommend naproxen twice daily.  Follow-up with primary physician.  After history, exam, and medical workup I feel the patient has been appropriately medically screened and is safe for discharge home. Pertinent diagnoses were discussed with the patient. Patient was given return precautions.   Final Clinical Impressions(s) / ED Diagnoses   Final diagnoses:  Right-sided chest wall pain    ED Discharge Orders        Ordered    naproxen (NAPROSYN) 500 MG tablet  2 times daily      12/05/17 0158       Sye Schroepfer, Barbette Hair, MD 12/05/17 0201

## 2017-12-05 LAB — URINALYSIS, ROUTINE W REFLEX MICROSCOPIC
BACTERIA UA: NONE SEEN
BILIRUBIN URINE: NEGATIVE
Glucose, UA: >=500 mg/dL — AB
Hgb urine dipstick: NEGATIVE
KETONES UR: NEGATIVE mg/dL
LEUKOCYTES UA: NEGATIVE
NITRITE: NEGATIVE
PH: 5 (ref 5.0–8.0)
Protein, ur: NEGATIVE mg/dL
Specific Gravity, Urine: 1.018 (ref 1.005–1.030)

## 2017-12-05 LAB — TROPONIN I: Troponin I: <0.03 ng/mL (ref <0.03)

## 2017-12-05 MED ORDER — NAPROXEN 500 MG PO TABS: 500.0000 mg | ORAL_TABLET | Freq: Two times a day (BID) | ORAL | 0 refills | Status: AC

## 2017-12-05 MED ORDER — HYDROCODONE-ACETAMINOPHEN 5-325 MG PO TABS
1.0000 | ORAL_TABLET | Freq: Once | ORAL | Status: AC
  Administered 2017-12-05: 1 via ORAL
  Filled 2017-12-05: qty 1

## 2017-12-05 NOTE — Discharge Instructions (Addendum)
You were seen today for right lower chest pain.  Your work-up is reassuring.  Your heart testing is negative.  The pain is reproducible on exam which may reflect a musculoskeletal cause.  Take naproxen twice daily.  Follow-up with your primary physician if not improving.

## 2017-12-18 ENCOUNTER — Ambulatory Visit (INDEPENDENT_AMBULATORY_CARE_PROVIDER_SITE_OTHER): Payer: BLUE CROSS/BLUE SHIELD | Admitting: Otolaryngology

## 2017-12-18 DIAGNOSIS — J320 Chronic maxillary sinusitis: Secondary | ICD-10-CM

## 2017-12-18 DIAGNOSIS — J321 Chronic frontal sinusitis: Secondary | ICD-10-CM

## 2017-12-18 DIAGNOSIS — J343 Hypertrophy of nasal turbinates: Secondary | ICD-10-CM

## 2017-12-19 ENCOUNTER — Telehealth: Payer: Self-pay | Admitting: Endocrinology

## 2017-12-19 NOTE — Telephone Encounter (Signed)
Dr Lorelee Cover is getting ready to put patient on prednisone due to a sinus infection for the next 14 days. Does he need to increase his medication  Patient stated his sugar has been running  a little high due to being sick and knows the prednisone will send his sugars higher   PLEASE ADVISE  [phone]   Insulin Glargine (LANTUS SOLOSTAR) 100 UNIT/ML Solostar Pen

## 2017-12-23 NOTE — Telephone Encounter (Signed)
I called patient, but no VM box was set up.

## 2017-12-23 NOTE — Telephone Encounter (Signed)
This is fine with me   

## 2017-12-23 NOTE — Telephone Encounter (Signed)
I would not suggest increasing the Lantus, as from what I see in the chart, he is already using 120 units daily.  Ideally, he would start Humalog or NovoLog before meals for the duration of the prednisone taper.  He can take 10 to 15 units per meal and increase as needed based on sugars.  This insulin is usually injected 15 minutes before he starts eating.  If he agrees, let us call the rapid acting insulin to his pharmacy.  We can send one box for now with 5 refills.

## 2017-12-24 NOTE — Telephone Encounter (Signed)
I spoke with patient & he stated that he did have some Novolog left. He would try starting out taking 10 units per meal. He said most blood sugars have been up around 280.

## 2017-12-26 ENCOUNTER — Ambulatory Visit: Payer: BLUE CROSS/BLUE SHIELD | Admitting: Internal Medicine

## 2018-01-27 ENCOUNTER — Other Ambulatory Visit (INDEPENDENT_AMBULATORY_CARE_PROVIDER_SITE_OTHER): Payer: Self-pay | Admitting: Otolaryngology

## 2018-01-27 DIAGNOSIS — J320 Chronic maxillary sinusitis: Secondary | ICD-10-CM

## 2018-01-29 ENCOUNTER — Other Ambulatory Visit: Payer: Self-pay | Admitting: Endocrinology

## 2018-01-29 NOTE — Telephone Encounter (Signed)
Patient needs refill on Metformin sent to [facility] ph# [phone]

## 2018-01-29 NOTE — Telephone Encounter (Signed)
Done

## 2018-02-03 ENCOUNTER — Ambulatory Visit (HOSPITAL_COMMUNITY)
Admission: RE | Admit: 2018-02-03 | Discharge: 2018-02-03 | Disposition: A | Discharge status: Home / Self Care | Payer: BLUE CROSS/BLUE SHIELD | Source: Ambulatory Visit | Attending: Otolaryngology | Admitting: Otolaryngology

## 2018-02-03 DIAGNOSIS — J320 Chronic maxillary sinusitis: Secondary | ICD-10-CM | POA: Diagnosis present

## 2018-02-05 ENCOUNTER — Ambulatory Visit (INDEPENDENT_AMBULATORY_CARE_PROVIDER_SITE_OTHER): Payer: Medicaid Other | Admitting: Otolaryngology

## 2018-02-05 DIAGNOSIS — J310 Chronic rhinitis: Secondary | ICD-10-CM

## 2018-02-05 DIAGNOSIS — R040 Epistaxis: Secondary | ICD-10-CM | POA: Diagnosis not present

## 2018-02-05 DIAGNOSIS — J320 Chronic maxillary sinusitis: Secondary | ICD-10-CM

## 2018-02-17 ENCOUNTER — Ambulatory Visit: Payer: BLUE CROSS/BLUE SHIELD | Admitting: Endocrinology

## 2018-02-19 ENCOUNTER — Ambulatory Visit: Payer: BLUE CROSS/BLUE SHIELD | Admitting: Endocrinology

## 2018-02-19 ENCOUNTER — Other Ambulatory Visit: Payer: Self-pay

## 2018-02-19 MED ORDER — METFORMIN HCL 500 MG PO TABS: ORAL_TABLET | ORAL | 2 refills | Status: DC

## 2018-04-28 ENCOUNTER — Telehealth: Payer: Self-pay | Admitting: Acute Care

## 2018-04-28 NOTE — Telephone Encounter (Signed)
Spoke with Lucia Estelle at [facility]. She stated that SG had written a RX for the patient to have a POC. Since the patient's insurance has changed, patient will need to have another CMN signed. I gave her the front fax number. She will fax this over today.   Nothing further needed at time of call.

## 2018-04-30 ENCOUNTER — Encounter: Payer: Self-pay | Admitting: Endocrinology

## 2018-04-30 ENCOUNTER — Ambulatory Visit (INDEPENDENT_AMBULATORY_CARE_PROVIDER_SITE_OTHER): Payer: Medicaid Other | Admitting: Endocrinology

## 2018-04-30 VITALS — BP 122/82 | HR 95 | Ht 71.0 in | Wt 230.0 lb

## 2018-04-30 DIAGNOSIS — IMO0002 Reserved for concepts with insufficient information to code with codable children: Secondary | ICD-10-CM

## 2018-04-30 DIAGNOSIS — E1165 Type 2 diabetes mellitus with hyperglycemia: Secondary | ICD-10-CM | POA: Diagnosis not present

## 2018-04-30 DIAGNOSIS — Z794 Long term (current) use of insulin: Secondary | ICD-10-CM | POA: Diagnosis not present

## 2018-04-30 DIAGNOSIS — E118 Type 2 diabetes mellitus with unspecified complications: Secondary | ICD-10-CM | POA: Diagnosis not present

## 2018-04-30 LAB — POCT GLYCOSYLATED HEMOGLOBIN (HGB A1C): Hemoglobin A1C: 11.7 % — AB (ref 4.0–5.6)

## 2018-04-30 MED ORDER — INSULIN GLARGINE 100 UNIT/ML SOLOSTAR PEN: 150.0000 [IU] | PEN_INJECTOR | SUBCUTANEOUS | 11 refills | Status: DC

## 2018-04-30 MED ORDER — EXENATIDE 5 MCG/0.02ML ~~LOC~~ SOPN: 5.0000 ug | PEN_INJECTOR | Freq: Two times a day (BID) | SUBCUTANEOUS | 11 refills | Status: DC

## 2018-04-30 MED ORDER — GLUCOSE BLOOD VI STRP: 1.0000 | ORAL_STRIP | Freq: Two times a day (BID) | 3 refills | Status: DC

## 2018-04-30 MED ORDER — ACCU-CHEK AVIVA DEVI: 1.0000 | Freq: Once | 0 refills | Status: AC

## 2018-04-30 NOTE — Patient Instructions (Addendum)
I have sent a prescription to your pharmacy, to increase the lantus to 150 units each morning. Also, I have sent a prescription to your pharmacy, to add "Byetta."  check your blood sugar twice a day.  vary the time of day when you check, between before the 3 meals, and at bedtime.  also check if you have symptoms of your blood sugar being too high or too low.  please keep a record of the readings and bring it to your next appointment here (or you can bring the meter itself).  You can write it on any piece of paper.  please call us sooner if your blood sugar goes below 70, or if you have a lot of readings over 200. Please call or message [place] next week, to tell us how the blood sugar is doing.   Please come back for a follow-up appointment in 1 month.

## 2018-04-30 NOTE — Progress Notes (Signed)
Subjective:    Patient ID: Matthew Dougherty, male    DOB: May 05, 1976, 42 y.o.   MRN: [id number]  HPI Pt returns for f/u of diabetes mellitus: DM type: Insulin-requiring type 2.   Dx'ed: 6578 Complications: polyneuropathy Therapy: insulin since 2009 DKA: never Severe hypoglycemia: never Pancreatitis: never Pancreatic imaging: never Other: he did not tolerate novolog (cramps).  Interval history: He says cbg varies from 229-400's.  He back on prednisone, for COPD and shoulder pain (now 40 mg qd--pt says he cannot taper anytime soon).  pt states he feels better in general, except for fatigue.   Past Medical History:  Diagnosis Date  . Arthritis   . Asthma   . Cancer ([place])    kidney  . Chronic kidney disease    partial removal of left kidney; cancer  . Chronic knee pain   . COPD (chronic obstructive pulmonary disease) ([place])   . Depression   . Diabetes mellitus without complication ([place])   . Gout   . Hypertension   . Neuropathy   . Osteoarthritis   . Renal cancer ([place])    2010 left partial nephrectomy  . Restless leg syndrome   . Seizures ([place])    few years ago; states that he aspirated and had a seizure; no meds for seizures and none since then    Past Surgical History:  Procedure Laterality Date  . KNEE SURGERY Left    as child; removal of straight pin from under knee cap.  Marland Kitchen PARTIAL NEPHRECTOMY Left   . pinched nerves in back    . SEPTOPLASTY      Social History   Socioeconomic History  . Marital status: Unknown    Spouse name: Not on file  . Number of children: Not on file  . Years of education: Not on file  . Highest education level: Not on file  Occupational History  . Not on file  Social Needs  . Financial resource strain: Not on file  . Food insecurity:    Worry: Not on file    Inability: Not on file  . Transportation needs:    Medical: Not on file    Non-medical: Not on file  Tobacco Use  . Smoking status: Current Every Day Smoker   Packs/day: 1.00    Years: 31.00    Pack years: 31.00    Types: Cigarettes  . Smokeless tobacco: Never Used  Substance and Sexual Activity  . Alcohol use: Yes    Comment: occasional  . Drug use: No  . Sexual activity: Yes  Lifestyle  . Physical activity:    Days per week: Not on file    Minutes per session: Not on file  . Stress: Not on file  Relationships  . Social connections:    Talks on phone: Not on file    Gets together: Not on file    Attends religious service: Not on file    Active member of club or organization: Not on file    Attends meetings of clubs or organizations: Not on file    Relationship status: Not on file  . Intimate partner violence:    Fear of current or ex partner: Not on file    Emotionally abused: Not on file    Physically abused: Not on file    Forced sexual activity: Not on file  Other Topics Concern  . Not on file  Social History Narrative   Works as a Dealer, Careers adviser    Current Outpatient  Medications on File Prior to Visit  Medication Sig Dispense Refill  . albuterol (PROAIR HFA) 108 (90 Base) MCG/ACT inhaler Inhale 2 puffs into the lungs every 6 (six) hours as needed for wheezing or shortness of breath. 1 Inhaler 1  . budesonide-formoterol (SYMBICORT) 160-4.5 MCG/ACT inhaler Inhale 2 puffs into the lungs 2 (two) times daily. 10.2 g 5  . citalopram (CELEXA) 40 MG tablet Take 40 mg by mouth daily.    . diazepam (VALIUM) 5 MG tablet   0  . gabapentin (NEURONTIN) 300 MG capsule Take 600 mg by mouth 3 (three) times daily. Reported on 01/25/2016    . HYDROcodone-acetaminophen (NORCO) 10-325 MG tablet   0  . metFORMIN (GLUCOPHAGE) 500 MG tablet Take one tablet twice daily 60 tablet 2  . metoprolol succinate (TOPROL-XL) 25 MG 24 hr tablet Take 25 mg by mouth daily.    . naproxen (NAPROSYN) 500 MG tablet Take 1 tablet (500 mg total) by mouth 2 (two) times daily. 30 tablet 0  . OXYGEN 2lpm with sleep and occ with exertion [name]    .  SPIRIVA RESPIMAT 2.5 MCG/ACT AERS INHALE 2 PUFFS ONCE DAILY. 4 g 11  . topiramate (TOPAMAX) 25 MG tablet 50 mg 2 (two) times daily.   0  . valsartan (DIOVAN) 80 MG tablet Take 1 tablet (80 mg total) by mouth daily. 30 tablet 11   No current facility-administered medications on file prior to visit.     Allergies  Allergen Reactions  . Penicillins     Family History  Problem Relation Age of Onset  . Breast cancer Mother   . Melanoma Father   . Diabetes Sister     BP 122/82   Pulse 95   Ht 5\' 11"  (1.803 m)   Wt 230 lb (104.3 kg)   SpO2 95%   BMI 32.08 kg/m    Review of Systems He denies hypoglycemia.      Objective:   Physical Exam VITAL SIGNS:  See vs page GENERAL: no distress Pulses: dorsalis pedis intact bilat.   MSK: no deformity of the feet CV: trace bilat leg leg edema Skin:  no ulcer on the feet.  normal color and temp on the feet. Neuro: sensation is intact to touch on the feet, but decreased from normal.     Lab Results  Component Value Date   HGBA1C 11.7 (A) 04/30/2018      Assessment & Plan:  Insulin-requiring type 2 DM, with polyneuropathy: worse COPD: he needs prednisone to breathe, so we'll work around that.   Patient Instructions  I have sent a prescription to your pharmacy, to increase the lantus to 150 units each morning. Also, I have sent a prescription to your pharmacy, to add "Byetta."  check your blood sugar twice a day.  vary the time of day when you check, between before the 3 meals, and at bedtime.  also check if you have symptoms of your blood sugar being too high or too low.  please keep a record of the readings and bring it to your next appointment here (or you can bring the meter itself).  You can write it on any piece of paper.  please call us sooner if your blood sugar goes below 70, or if you have a lot of readings over 200. Please call or message [place] next week, to tell us how the blood sugar is doing.   Please come back for a  follow-up appointment in 1 month.

## 2018-05-01 ENCOUNTER — Telehealth: Payer: Self-pay | Admitting: Internal Medicine

## 2018-05-01 NOTE — Telephone Encounter (Signed)
Returned call and spoke with [employer].  Insurance will not cover spiriva respimat, but will cover regular spiriva handihaler.     Dr. Melvyn Novas, please advise

## 2018-05-01 NOTE — Telephone Encounter (Signed)
  Per [facility] if he's used that format otherwise should do ov once he gets it for training by one of our NP's - bring formulary for alternative  [place], ok for Spiriva change.  Patient scheduled with Geraldo Pitter, NP, 05/13/18, at 1200.  Nothing further at this time.

## 2018-05-01 NOTE — Telephone Encounter (Signed)
Ok if he's used that format otherwise should do ov once he gets it for training by one of our NP's - bring formulary for alternative

## 2018-05-13 ENCOUNTER — Ambulatory Visit (INDEPENDENT_AMBULATORY_CARE_PROVIDER_SITE_OTHER): Payer: Medicaid Other | Admitting: Primary Care

## 2018-05-13 ENCOUNTER — Encounter: Payer: Self-pay | Admitting: Primary Care

## 2018-05-13 VITALS — BP 108/72 | HR 113 | Temp 97.9°F | Ht 71.0 in | Wt 227.0 lb

## 2018-05-13 DIAGNOSIS — J9611 Chronic respiratory failure with hypoxia: Secondary | ICD-10-CM

## 2018-05-13 DIAGNOSIS — Z23 Encounter for immunization: Secondary | ICD-10-CM | POA: Diagnosis not present

## 2018-05-13 DIAGNOSIS — J449 Chronic obstructive pulmonary disease, unspecified: Secondary | ICD-10-CM | POA: Diagnosis not present

## 2018-05-13 MED ORDER — MONTELUKAST SODIUM 10 MG PO TABS: 10.0000 mg | ORAL_TABLET | Freq: Every day | ORAL | 11 refills | Status: AC

## 2018-05-13 MED ORDER — ALBUTEROL SULFATE HFA 108 (90 BASE) MCG/ACT IN AERS: 2.0000 | INHALATION_SPRAY | Freq: Four times a day (QID) | RESPIRATORY_TRACT | 4 refills | Status: AC | PRN

## 2018-05-13 NOTE — Assessment & Plan Note (Signed)
Continue nocturnal oxygen and prn on exertion to keep O2 > 90%

## 2018-05-13 NOTE — Patient Instructions (Addendum)
Spiriva HandiHaler- TWO puffs (1 capsule) daily   Continue Symbicort 160- 2 puffs twice a day   Start Singulair (RX sent to pharmacy)  Goal is to keep the saturations above 90% during the day with exertion   STOP smoking!!! Speak with PCP about options to managing anxiety Also may want to consider counseling   Follow-up in 3 months with Dr. Dondra Prader and Influenza vaccine today    Steps to Quit Smoking Smoking tobacco can be bad for your health. It can also affect almost every organ in your body. Smoking puts you and people around you at risk for many serious long-lasting (chronic) diseases. Quitting smoking is hard, but it is one of the best things that you can do for your health. It is never too late to quit. What are the benefits of quitting smoking? When you quit smoking, you lower your risk for getting serious diseases and conditions. They can include:  Lung cancer or lung disease.  Heart disease.  Stroke.  Heart attack.  Not being able to have children (infertility).  Weak bones (osteoporosis) and broken bones (fractures).  If you have coughing, wheezing, and shortness of breath, those symptoms may get better when you quit. You may also get sick less often. If you are pregnant, quitting smoking can help to lower your chances of having a baby of low birth weight. What can I do to help me quit smoking? Talk with your doctor about what can help you quit smoking. Some things you can do (strategies) include:  Quitting smoking totally, instead of slowly cutting back how much you smoke over a period of time.  Going to in-person counseling. You are more likely to quit if you go to many counseling sessions.  Using resources and support systems, such as: ? Database administrator with a Social worker. ? Phone quitlines. ? Careers information officer. ? Support groups or group counseling. ? Text messaging programs. ? Mobile phone apps or applications.  Taking medicines. Some of  these medicines may have nicotine in them. If you are pregnant or breastfeeding, do not take any medicines to quit smoking unless your doctor says it is okay. Talk with your doctor about counseling or other things that can help you.  Talk with your doctor about using more than one strategy at the same time, such as taking medicines while you are also going to in-person counseling. This can help make quitting easier. What things can I do to make it easier to quit? Quitting smoking might feel very hard at first, but there is a lot that you can do to make it easier. Take these steps:  Talk to your family and friends. Ask them to support and encourage you.  Call phone quitlines, reach out to support groups, or work with a Social worker.  Ask people who smoke to not smoke around you.  Avoid places that make you want (trigger) to smoke, such as: ? Bars. ? Parties. ? Smoke-break areas at work.  Spend time with people who do not smoke.  Lower the stress in your life. Stress can make you want to smoke. Try these things to help your stress: ? Getting regular exercise. ? Deep-breathing exercises. ? Yoga. ? Meditating. ? Doing a body scan. To do this, close your eyes, focus on one area of your body at a time from head to toe, and notice which parts of your body are tense. Try to relax the muscles in those areas.  Download or buy apps on  your mobile phone or tablet that can help you stick to your quit plan. There are many free apps, such as QuitGuide from the [employer] Office manager for Disease Control and Prevention). You can find more support from smokefree.gov and other websites.  This information is not intended to replace advice given to you by your health care provider. Make sure you discuss any questions you have with your health care provider. Document Released: 04/20/2009 Document Revised: 02/20/2016 Document Reviewed: 11/08/2014 Elsevier Interactive Patient Education  2018 [employer].

## 2018-05-13 NOTE — Progress Notes (Signed)
@Patient  ID: Matthew Dougherty, male    DOB: 11/16/1975, 42 y.o.   MRN: [id number]  Chief Complaint  Patient presents with  . Follow-up    breathing some days better, some not; cough, SOB with exertion    Referring provider: Glenda Chroman, MD  HPI: 42 year old, current smoker (31 pack year hx). PMH COPD GOLD III. Patient of Dr. Melvyn Novas, last seen 11/10/17.   05/13/2018 Patient presents today for follow-up visit. Patient had been prescribed Spiriva respimat but insurance denied coverage. Needs to re-learn how to use handi-haler today. Refill medication last week. He has only been using 1 puff daily. More susceptible to thrush symptoms with this medication. States that he rinses his mouth out. Breathing wise he is about the same. States that he has some good days and some bad. Depends on weather and allergies. Taking zyrtec and nasal spray. Continues to smoke 1-2 ppd. Smokes a lot when playing video games. States that he smokes to help deal with people around him and cope. Thinks he could quit now. Reports past drug use.   Allergies  Allergen Reactions  . Penicillins     Immunization History  Administered Date(s) Administered  . Influenza,inj,Quad PF,6+ Mos 03/29/2016, 05/13/2018  . Influenza,inj,quad, With Preservative 05/02/2017  . Pneumococcal Conjugate-13 05/13/2018  . Pneumococcal-Unspecified 07/08/2012    Past Medical History:  Diagnosis Date  . Arthritis   . Asthma   . Cancer ([place])    kidney  . Chronic kidney disease    partial removal of left kidney; cancer  . Chronic knee pain   . COPD (chronic obstructive pulmonary disease) ([place])   . Depression   . Diabetes mellitus without complication ([place])   . Gout   . Hypertension   . Neuropathy   . Osteoarthritis   . Renal cancer ([place])    2010 left partial nephrectomy  . Restless leg syndrome   . Seizures ([place])    few years ago; states that he aspirated and had a seizure; no meds for seizures and none since then     Tobacco History: Social History   Tobacco Use  Smoking Status Current Every Day Smoker  . Packs/day: 1.00  . Years: 31.00  . Pack years: 31.00  . Types: Cigarettes  Smokeless Tobacco Never Used   Ready to quit: Not Answered Counseling given: Not Answered   Outpatient Medications Prior to Visit  Medication Sig Dispense Refill  . budesonide-formoterol (SYMBICORT) 160-4.5 MCG/ACT inhaler Inhale 2 puffs into the lungs 2 (two) times daily. 10.2 g 5  . citalopram (CELEXA) 40 MG tablet Take 40 mg by mouth daily.    . diazepam (VALIUM) 5 MG tablet   0  . exenatide (BYETTA 5 MCG PEN) 5 MCG/0.02ML SOPN injection Inject 0.02 mLs (5 mcg total) into the skin 2 (two) times daily with a meal. And pen needles 1/day 1 pen 11  . gabapentin (NEURONTIN) 300 MG capsule Take 600 mg by mouth 3 (three) times daily. Reported on 01/25/2016    . glucose blood (ACCU-CHEK AVIVA) test strip 1 each by Other route 2 (two) times daily. And lancets 2/day 200 each 3  . HYDROcodone-acetaminophen (NORCO) 10-325 MG tablet   0  . Insulin Glargine (LANTUS SOLOSTAR) 100 UNIT/ML Solostar Pen Inject 150 Units into the skin every morning. And pen needles 1/day 20 pen 11  . metFORMIN (GLUCOPHAGE) 500 MG tablet Take one tablet twice daily 60 tablet 2  . metoprolol succinate (TOPROL-XL) 25 MG 24 hr  tablet Take 25 mg by mouth daily.    . naproxen (NAPROSYN) 500 MG tablet Take 1 tablet (500 mg total) by mouth 2 (two) times daily. 30 tablet 0  . OXYGEN 2lpm with sleep and occ with exertion [name]    . SPIRIVA RESPIMAT 2.5 MCG/ACT AERS INHALE 2 PUFFS ONCE DAILY. 4 g 11  . topiramate (TOPAMAX) 25 MG tablet 50 mg 2 (two) times daily.   0  . valsartan (DIOVAN) 80 MG tablet Take 1 tablet (80 mg total) by mouth daily. 30 tablet 11  . albuterol (PROAIR HFA) 108 (90 Base) MCG/ACT inhaler Inhale 2 puffs into the lungs every 6 (six) hours as needed for wheezing or shortness of breath. 1 Inhaler 1   No facility-administered medications  prior to visit.     Review of Systems  Review of Systems  Constitutional: Negative.   HENT: Negative.   Respiratory: Positive for cough and shortness of breath.   Cardiovascular: Negative.   Psychiatric/Behavioral: Negative for agitation, behavioral problems, confusion, hallucinations and self-injury. The patient is nervous/anxious and is hyperactive.     Physical Exam  BP 108/72 (BP Location: Right Arm, Cuff Size: Normal)   Pulse (!) 113   Temp 97.9 F (36.6 C)   Ht 5\' 11"  (1.803 m)   Wt 227 lb (103 kg)   SpO2 93%   BMI 31.66 kg/m  Physical Exam  Constitutional: He is oriented to person, place, and time. He appears well-developed and well-nourished.  HENT:  Head: Normocephalic and atraumatic.  Eyes: Pupils are equal, round, and reactive to light. EOM are normal.  Neck: Normal range of motion. Neck supple.  Cardiovascular: Regular rhythm.  Regular, mild tachycardia (baseline for pt)  Pulmonary/Chest: Effort normal. No respiratory distress. He has no wheezes.  Musculoskeletal: Normal range of motion.  Neurological: He is alert and oriented to person, place, and time.  Some anxiety/hyperactivity   Skin: Skin is warm and dry.  Psychiatric: His behavior is normal. Judgment and thought content normal.     Lab Results:  CBC    Component Value Date/Time   WBC 10.2 01/05/2016 1110   RBC 5.23 01/05/2016 1110   HGB 17.0 01/05/2016 1110   HCT 48.7 01/05/2016 1110   PLT 261 01/05/2016 1110   MCV 93.1 01/05/2016 1110   MCH 32.5 01/05/2016 1110   MCHC 34.9 01/05/2016 1110   RDW 13.2 01/05/2016 1110    BMET    Component Value Date/Time   NA 138 05/13/2016 1039   K 4.7 05/13/2016 1039   CL 96 05/13/2016 1039   CO2 19 05/13/2016 1039   GLUCOSE 210 (H) 05/13/2016 1039   GLUCOSE 365 (H) 01/05/2016 1110   BUN 10 05/13/2016 1039   CREATININE 1.04 05/13/2016 1039   CALCIUM 9.3 05/13/2016 1039   GFRNONAA 89 05/13/2016 1039   GFRAA 103 05/13/2016 1039    BNP No  results found for: BNP  ProBNP No results found for: PROBNP  Imaging: No results found.   Assessment & Plan:   COPD GOLD III/ still smoking with restrictive component  - Continue Symbicort 160 BID  - Add Singulair d/t allergy symptoms  - Spiriva changed to HandiHaler d/t insurance coverage, take 2 puffs daily  - Peak inspiratory flow meter was 50 set on HandiHaler device, patient has 90% effectiveness with device - Susceptible to thrush symptoms, rinses mouth out and takes prophylactic yogurt - Strongly encourage smoking cessation, discuss behavioral therapy with PCP  - Prevnar and Influenza vaccine today  Chronic respiratory failure with hypoxia (HCC)/ nocturnal 02 only  Continue nocturnal oxygen and prn on exertion to keep O2 > 90%   FU in 3 months  Martyn Ehrich, NP 05/13/2018

## 2018-05-13 NOTE — Assessment & Plan Note (Addendum)
-   Continue Symbicort 160 BID  - Add Singulair d/t allergy symptoms  - Spiriva changed to HandiHaler d/t insurance coverage, take 2 puffs daily  - Peak inspiratory flow meter was 50 set on HandiHaler device, patient has 90% effectiveness with device - Susceptible to thrush symptoms, rinses mouth out and takes prophylactic yogurt - Strongly encourage smoking cessation, discuss behavioral therapy with PCP  - Prevnar and Influenza vaccine today

## 2018-05-21 ENCOUNTER — Telehealth: Payer: Self-pay | Admitting: Internal Medicine

## 2018-05-21 NOTE — Progress Notes (Signed)
Chart and office note reviewed in detail  > agree with a/p as outlined    

## 2018-05-21 NOTE — Telephone Encounter (Signed)
[place]. Unable to reach. Left message to give [place] a call back.

## 2018-05-22 NOTE — Telephone Encounter (Signed)
[place] and spoke with [place]. Per Lucia Estelle, due to pt having medicaid they needed to know all the qualification saturation in order to be able to get pt approved for his O2.  I stated to [place] that there was documentation from 10/16/17 where pt's saturations at rest on room air were 92%, saturations while ambulating on room air were 87%, and pt was put on 2L O2 while ambulating to keep his saturations at 94%. This was done by Quentin Ore, [place].  Stated to [place] that pt saw SG at this visit and SG stated in the OV that pt did qualify for O2 to wear during the day while ambulating. I also stated to [place] in that OV, SG stated for pt to continue wearing 2L O2 at bedtime.  Lucia Estelle expressed understanding with the information that was stated to her. Nothing further needed.

## 2018-06-01 ENCOUNTER — Telehealth: Payer: Self-pay | Admitting: Endocrinology

## 2018-06-01 ENCOUNTER — Ambulatory Visit: Payer: Medicaid Other | Admitting: Endocrinology

## 2018-06-01 DIAGNOSIS — Z0289 Encounter for other administrative examinations: Secondary | ICD-10-CM

## 2018-06-01 NOTE — Telephone Encounter (Signed)
error 

## 2018-06-13 ENCOUNTER — Other Ambulatory Visit: Payer: Self-pay | Admitting: Endocrinology

## 2018-06-16 ENCOUNTER — Encounter: Payer: Self-pay | Admitting: Endocrinology

## 2018-06-16 ENCOUNTER — Ambulatory Visit (INDEPENDENT_AMBULATORY_CARE_PROVIDER_SITE_OTHER): Payer: Medicaid Other | Admitting: Endocrinology

## 2018-06-16 VITALS — BP 124/74 | HR 113 | Ht 71.0 in | Wt 228.0 lb

## 2018-06-16 DIAGNOSIS — E118 Type 2 diabetes mellitus with unspecified complications: Secondary | ICD-10-CM

## 2018-06-16 DIAGNOSIS — E1165 Type 2 diabetes mellitus with hyperglycemia: Secondary | ICD-10-CM

## 2018-06-16 DIAGNOSIS — IMO0002 Reserved for concepts with insufficient information to code with codable children: Secondary | ICD-10-CM

## 2018-06-16 DIAGNOSIS — Z794 Long term (current) use of insulin: Secondary | ICD-10-CM | POA: Diagnosis not present

## 2018-06-16 LAB — POCT GLYCOSYLATED HEMOGLOBIN (HGB A1C): Hemoglobin A1C: 11.6 % — AB (ref 4.0–5.6)

## 2018-06-16 MED ORDER — EXENATIDE 10 MCG/0.04ML ~~LOC~~ SOPN: 10.0000 ug | PEN_INJECTOR | Freq: Two times a day (BID) | SUBCUTANEOUS | 11 refills | Status: DC

## 2018-06-16 NOTE — Progress Notes (Signed)
Subjective:    Patient ID: Matthew Dougherty, male    DOB: [birth year], 42 y.o.   MRN: [id number]  HPI Pt returns for f/u of diabetes mellitus: DM type: Insulin-requiring type 2.   Dx'ed: 2751 Complications: polyneuropathy Therapy: insulin since 2009 DKA: never Severe hypoglycemia: never Pancreatitis: never Pancreatic imaging: never Other: he did not tolerate novolog (cramps).   Interval history: He says cbg varies from 85-400's.  It is in general higher as the day goes on.  He again back on prednisone, for COPD (now 40 mg qd--pt says he cannot taper anytime soon).  pt states he feels better in general, except for fatigue.   Past Medical History:  Diagnosis Date  . Arthritis   . Asthma   . Cancer ([place])    kidney  . Chronic kidney disease    partial removal of left kidney; cancer  . Chronic knee pain   . COPD (chronic obstructive pulmonary disease) ([place])   . Depression   . Diabetes mellitus without complication ([place])   . Gout   . Hypertension   . Neuropathy   . Osteoarthritis   . Renal cancer ([place])    2010 left partial nephrectomy  . Restless leg syndrome   . Seizures ([place])    few years ago; states that he aspirated and had a seizure; no meds for seizures and none since then    Past Surgical History:  Procedure Laterality Date  . KNEE SURGERY Left    as child; removal of straight pin from under knee cap.  Marland Kitchen PARTIAL NEPHRECTOMY Left   . pinched nerves in back    . SEPTOPLASTY      Social History   Socioeconomic History  . Marital status: Unknown    Spouse name: Not on file  . Number of children: Not on file  . Years of education: Not on file  . Highest education level: Not on file  Occupational History  . Not on file  Social Needs  . Financial resource strain: Not on file  . Food insecurity:    Worry: Not on file    Inability: Not on file  . Transportation needs:    Medical: Not on file    Non-medical: Not on file  Tobacco Use  . Smoking status:  Current Every Day Smoker    Packs/day: 1.00    Years: 31.00    Pack years: 31.00    Types: Cigarettes  . Smokeless tobacco: Never Used  Substance and Sexual Activity  . Alcohol use: Yes    Comment: occasional  . Drug use: No  . Sexual activity: Yes  Lifestyle  . Physical activity:    Days per week: Not on file    Minutes per session: Not on file  . Stress: Not on file  Relationships  . Social connections:    Talks on phone: Not on file    Gets together: Not on file    Attends religious service: Not on file    Active member of club or organization: Not on file    Attends meetings of clubs or organizations: Not on file    Relationship status: Not on file  . Intimate partner violence:    Fear of current or ex partner: Not on file    Emotionally abused: Not on file    Physically abused: Not on file    Forced sexual activity: Not on file  Other Topics Concern  . Not on file  Social History Narrative  Works as a Dealer, Careers adviser    Current Outpatient Medications on File Prior to Visit  Medication Sig Dispense Refill  . albuterol (PROAIR HFA) 108 (90 Base) MCG/ACT inhaler Inhale 2 puffs into the lungs every 6 (six) hours as needed for wheezing or shortness of breath. 1 Inhaler 4  . budesonide-formoterol (SYMBICORT) 160-4.5 MCG/ACT inhaler Inhale 2 puffs into the lungs 2 (two) times daily. 10.2 g 5  . citalopram (CELEXA) 40 MG tablet Take 40 mg by mouth daily.    . diazepam (VALIUM) 5 MG tablet   0  . gabapentin (NEURONTIN) 300 MG capsule Take 600 mg by mouth 3 (three) times daily. Reported on 01/25/2016    . glucose blood (ACCU-CHEK AVIVA) test strip 1 each by Other route 2 (two) times daily. And lancets 2/day 200 each 3  . HYDROcodone-acetaminophen (NORCO) 10-325 MG tablet   0  . Insulin Glargine (LANTUS SOLOSTAR) 100 UNIT/ML Solostar Pen Inject 150 Units into the skin every morning. And pen needles 1/day 20 pen 11  . metFORMIN (GLUCOPHAGE) 500 MG tablet TAKE (1)  TABLET TWICE DAILY. 60 tablet 0  . metoprolol succinate (TOPROL-XL) 25 MG 24 hr tablet Take 25 mg by mouth daily.    . montelukast (SINGULAIR) 10 MG tablet Take 1 tablet (10 mg total) by mouth at bedtime. 30 tablet 11  . naproxen (NAPROSYN) 500 MG tablet Take 1 tablet (500 mg total) by mouth 2 (two) times daily. 30 tablet 0  . OXYGEN 2lpm with sleep and occ with exertion [name]    . SPIRIVA RESPIMAT 2.5 MCG/ACT AERS INHALE 2 PUFFS ONCE DAILY. 4 g 11  . topiramate (TOPAMAX) 25 MG tablet 50 mg 2 (two) times daily.   0  . valsartan (DIOVAN) 80 MG tablet Take 1 tablet (80 mg total) by mouth daily. 30 tablet 11   No current facility-administered medications on file prior to visit.     Allergies  Allergen Reactions  . Penicillins     Family History  Problem Relation Age of Onset  . Breast cancer Mother   . Melanoma Father   . Diabetes Sister     BP 124/74 (BP Location: Left Arm, Patient Position: Sitting, Cuff Size: Normal)   Pulse (!) 113   Ht 5\' 11"  (1.803 m)   Wt 228 lb (103.4 kg)   SpO2 92%   BMI 31.80 kg/m    Review of Systems He denies hypoglycemia.  He has lost a few lbs.      Objective:   Physical Exam VITAL SIGNS:  See vs page GENERAL: no distress Pulses: dorsalis pedis intact bilat.   MSK: no deformity of the feet CV: no leg edema Skin:  no ulcer on the feet.  normal color and temp on the feet. Neuro: sensation is intact to touch on the feet, but decreased from normal.   Lab Results  Component Value Date   HGBA1C 11.6 (A) 06/16/2018       Assessment & Plan:  Insulin-requiring type 2 DM, with PN: poor glycemic control.  COPD: prednisone affects glycemic control.  Obesity: pt requests for this to be considered in med regimen.    Patient Instructions  I have sent a prescription to your pharmacy, to increase Byetta to 10 mcg twice a day  Please continue the same other diabetes medications check your blood sugar twice a day.  vary the time of day when you  check, between before the 3 meals, and at bedtime.  also check if  you have symptoms of your blood sugar being too high or too low.  please keep a record of the readings and bring it to your next appointment here (or you can bring the meter itself).  You can write it on any piece of paper.  please call us sooner if your blood sugar goes below 70, or if you have a lot of readings over 200.  Please come back for a follow-up appointment in 1 month.

## 2018-06-16 NOTE — Patient Instructions (Addendum)
I have sent a prescription to your pharmacy, to increase Byetta to 10 mcg twice a day  Please continue the same other diabetes medications check your blood sugar twice a day.  vary the time of day when you check, between before the 3 meals, and at bedtime.  also check if you have symptoms of your blood sugar being too high or too low.  please keep a record of the readings and bring it to your next appointment here (or you can bring the meter itself).  You can write it on any piece of paper.  please call us sooner if your blood sugar goes below 70, or if you have a lot of readings over 200.  Please come back for a follow-up appointment in 1 month.

## 2018-06-17 LAB — MICROALBUMIN / CREATININE URINE RATIO
Creatinine,U: 75.4 mg/dL
MICROALB UR: 2.7 mg/dL — AB (ref 0.0–1.9)
MICROALB/CREAT RATIO: 3.6 mg/g (ref 0.0–30.0)

## 2018-07-09 NOTE — Telephone Encounter (Signed)
error 

## 2018-07-15 ENCOUNTER — Other Ambulatory Visit: Payer: Self-pay | Admitting: Endocrinology

## 2018-07-23 ENCOUNTER — Ambulatory Visit (INDEPENDENT_AMBULATORY_CARE_PROVIDER_SITE_OTHER): Payer: Medicaid Other | Admitting: Endocrinology

## 2018-07-23 DIAGNOSIS — R200 Anesthesia of skin: Secondary | ICD-10-CM | POA: Diagnosis not present

## 2018-07-23 MED ORDER — EMPAGLIFLOZIN 25 MG PO TABS: 25.0000 mg | ORAL_TABLET | Freq: Every day | ORAL | 11 refills | Status: DC

## 2018-07-23 NOTE — Patient Instructions (Addendum)
I have sent a prescription to your pharmacy, to add "Jardiance." Please continue the same other diabetes medications Blood tests are requested for you today.  We'll let you know about the results.  check your blood sugar twice a day.  vary the time of day when you check, between before the 3 meals, and at bedtime.  also check if you have symptoms of your blood sugar being too high or too low.  please keep a record of the readings and bring it to your next appointment here (or you can bring the meter itself).  You can write it on any piece of paper.  please call us sooner if your blood sugar goes below 70, or if you have a lot of readings over 200.  Please come back for a follow-up appointment in 4-6 weeks

## 2018-07-23 NOTE — Progress Notes (Signed)
Subjective:    Patient ID: Matthew Dougherty, male    DOB: [birth year], 43 y.o.   MRN: [id number]  HPI Pt returns for f/u of diabetes mellitus: DM type: Insulin-requiring type 2.   Dx'ed: 6160 Complications: polyneuropathy Therapy: insulin since 2009 DKA: never Severe hypoglycemia: never Pancreatitis: never Pancreatic imaging: never Other: he did not tolerate novolog (cramps); he takes intermitt prednisone, for COPD; wife declines for him to resume invokana (proteinuria).  Interval history: no cbg record, but states cbg's are in the 200's.  He recently got steroid injections into the left shoulder and right knee.   Past Medical History:  Diagnosis Date  . Arthritis   . Asthma   . Cancer ([place])    kidney  . Chronic kidney disease    partial removal of left kidney; cancer  . Chronic knee pain   . COPD (chronic obstructive pulmonary disease) ([place])   . Depression   . Diabetes mellitus without complication ([place])   . Gout   . Hypertension   . Neuropathy   . Osteoarthritis   . Renal cancer ([place])    2010 left partial nephrectomy  . Restless leg syndrome   . Seizures ([place])    few years ago; states that he aspirated and had a seizure; no meds for seizures and none since then    Past Surgical History:  Procedure Laterality Date  . KNEE SURGERY Left    as child; removal of straight pin from under knee cap.  Marland Kitchen PARTIAL NEPHRECTOMY Left   . pinched nerves in back    . SEPTOPLASTY      Social History   Socioeconomic History  . Marital status: Unknown    Spouse name: Not on file  . Number of children: Not on file  . Years of education: Not on file  . Highest education level: Not on file  Occupational History  . Not on file  Social Needs  . Financial resource strain: Not on file  . Food insecurity:    Worry: Not on file    Inability: Not on file  . Transportation needs:    Medical: Not on file    Non-medical: Not on file  Tobacco Use  . Smoking status: Current Every  Day Smoker    Packs/day: 1.00    Years: 31.00    Pack years: 31.00    Types: Cigarettes  . Smokeless tobacco: Never Used  Substance and Sexual Activity  . Alcohol use: Yes    Comment: occasional  . Drug use: No  . Sexual activity: Yes  Lifestyle  . Physical activity:    Days per week: Not on file    Minutes per session: Not on file  . Stress: Not on file  Relationships  . Social connections:    Talks on phone: Not on file    Gets together: Not on file    Attends religious service: Not on file    Active member of club or organization: Not on file    Attends meetings of clubs or organizations: Not on file    Relationship status: Not on file  . Intimate partner violence:    Fear of current or ex partner: Not on file    Emotionally abused: Not on file    Physically abused: Not on file    Forced sexual activity: Not on file  Other Topics Concern  . Not on file  Social History Narrative   Works as a Dealer, Careers adviser  Current Outpatient Medications on File Prior to Visit  Medication Sig Dispense Refill  . albuterol (PROAIR HFA) 108 (90 Base) MCG/ACT inhaler Inhale 2 puffs into the lungs every 6 (six) hours as needed for wheezing or shortness of breath. 1 Inhaler 4  . budesonide-formoterol (SYMBICORT) 160-4.5 MCG/ACT inhaler Inhale 2 puffs into the lungs 2 (two) times daily. 10.2 g 5  . citalopram (CELEXA) 40 MG tablet Take 40 mg by mouth daily.    . diazepam (VALIUM) 5 MG tablet   0  . exenatide (BYETTA 10 MCG PEN) 10 MCG/0.04ML SOPN injection Inject 0.04 mLs (10 mcg total) into the skin 2 (two) times daily with a meal. 1 pen 11  . gabapentin (NEURONTIN) 300 MG capsule Take 600 mg by mouth 3 (three) times daily. Reported on 01/25/2016    . glucose blood (ACCU-CHEK AVIVA) test strip 1 each by Other route 2 (two) times daily. And lancets 2/day 200 each 3  . HYDROcodone-acetaminophen (NORCO) 10-325 MG tablet   0  . Insulin Glargine (LANTUS SOLOSTAR) 100 UNIT/ML  Solostar Pen Inject 150 Units into the skin every morning. And pen needles 1/day 20 pen 11  . metFORMIN (GLUCOPHAGE) 500 MG tablet TAKE (1) TABLET TWICE DAILY. 60 tablet 0  . metoprolol succinate (TOPROL-XL) 25 MG 24 hr tablet Take 25 mg by mouth daily.    . montelukast (SINGULAIR) 10 MG tablet Take 1 tablet (10 mg total) by mouth at bedtime. 30 tablet 11  . OXYGEN 2lpm with sleep and occ with exertion [name]    . SPIRIVA RESPIMAT 2.5 MCG/ACT AERS INHALE 2 PUFFS ONCE DAILY. 4 g 11  . valsartan (DIOVAN) 80 MG tablet Take 1 tablet (80 mg total) by mouth daily. 30 tablet 11  . naproxen (NAPROSYN) 500 MG tablet Take 1 tablet (500 mg total) by mouth 2 (two) times daily. (Patient not taking: Reported on 07/23/2018) 30 tablet 0  . topiramate (TOPAMAX) 25 MG tablet 50 mg 2 (two) times daily.   0   No current facility-administered medications on file prior to visit.     Allergies  Allergen Reactions  . Penicillins     Family History  Problem Relation Age of Onset  . Breast cancer Mother   . Melanoma Father   . Diabetes Sister     BP 122/78 (BP Location: Left Arm, Patient Position: Sitting, Cuff Size: Normal)   Pulse (!) 107   Ht 5\' 11"  (1.803 m)   Wt 224 lb 3.2 oz (101.7 kg)   SpO2 93%   BMI 31.27 kg/m    Review of Systems He denies hypoglycemia and nausea.     Objective:   Physical Exam VITAL SIGNS:  See vs page GENERAL: no distress Pulses: dorsalis pedis intact bilat.   MSK: no deformity of the feet CV: no leg edema Skin:  no ulcer on the feet.  normal color and temp on the feet. Neuro: sensation is intact to touch on the feet, but decreased from normal.       Assessment & Plan:  Insulin-requiring type 2 DM, with PN: he needs increased rx Proteinuria: we discussed.  Plan will be to recheck urine protein on jardiance. Arthralgias: steroid injections are affecting glycemic control   Patient Instructions  I have sent a prescription to your pharmacy, to add  "Jardiance." Please continue the same other diabetes medications Blood tests are requested for you today.  We'll let you know about the results.  check your blood sugar twice a day.  vary the time of day when you check, between before the 3 meals, and at bedtime.  also check if you have symptoms of your blood sugar being too high or too low.  please keep a record of the readings and bring it to your next appointment here (or you can bring the meter itself).  You can write it on any piece of paper.  please call us sooner if your blood sugar goes below 70, or if you have a lot of readings over 200.  Please come back for a follow-up appointment in 4-6 weeks

## 2018-07-24 LAB — BASIC METABOLIC PANEL
BUN: 11 mg/dL (ref 6–23)
CALCIUM: 10 mg/dL (ref 8.4–10.5)
CO2: 22 meq/L (ref 19–32)
CREATININE: 0.91 mg/dL (ref 0.40–1.50)
Chloride: 102 mEq/L (ref 96–112)
GFR: 90.47 mL/min (ref >60.00)
GLUCOSE: 165 mg/dL — AB (ref 70–99)
Potassium: 4.6 mEq/L (ref 3.5–5.1)
Sodium: 136 mEq/L (ref 135–145)

## 2018-07-24 LAB — VITAMIN B12: Vitamin B-12: 823 pg/mL (ref 211–911)

## 2018-08-13 ENCOUNTER — Ambulatory Visit: Payer: Medicaid Other | Admitting: Internal Medicine

## 2018-08-15 ENCOUNTER — Other Ambulatory Visit: Payer: Self-pay | Admitting: Endocrinology

## 2018-08-21 ENCOUNTER — Ambulatory Visit: Payer: Medicaid Other | Admitting: Internal Medicine

## 2018-08-31 ENCOUNTER — Ambulatory Visit (INDEPENDENT_AMBULATORY_CARE_PROVIDER_SITE_OTHER): Payer: Medicaid Other | Admitting: Internal Medicine

## 2018-08-31 ENCOUNTER — Encounter: Payer: Self-pay | Admitting: Internal Medicine

## 2018-08-31 DIAGNOSIS — J449 Chronic obstructive pulmonary disease, unspecified: Secondary | ICD-10-CM | POA: Diagnosis not present

## 2018-08-31 DIAGNOSIS — F17200 Nicotine dependence, unspecified, uncomplicated: Secondary | ICD-10-CM

## 2018-08-31 MED ORDER — BUDESONIDE-FORMOTEROL FUMARATE 160-4.5 MCG/ACT IN AERO: 2.0000 | INHALATION_SPRAY | Freq: Two times a day (BID) | RESPIRATORY_TRACT | 0 refills | Status: DC

## 2018-08-31 NOTE — Progress Notes (Addendum)
Subjective:    Patient ID: Matthew Dougherty, male    DOB: 05-Dec-1975,    MRN: [id number]    Brief patient profile:  43yowm active smoker/MM  from [place] good ex tol as teenager though had dx of asthma starting around age 45-14 and able to play to football/ cross country and had inhaler but didn't use it much until around 2010 need for saba increased and around 2014 placed on maint rx with advair but not better with criteria for severe copd in 04/2014 referred to pulmonary clinic 02/16/2016 by Dr Matthew Dougherty      History of Present Illness  02/16/2016 1st [facility] office visit/ Matthew Dougherty  maint rx advair hfa  spiriva dpi / acei Chief Complaint  Patient presents with  . Pulmonary Consult    Referred by Dr. Woody Dougherty. Pt states dxed with COPD in 2015. He c/o increased cough and SOB over the past year. He states that some days he gets winded just walking around his house. His cough is prod with clear sputum and tends to be worse first thing in the am and then at night. He states that his symptoms can flare when exposed to certain smells and also with weather change.   placed on acei x 10 y and cough worse x around 2 years prior to OV  - no classic pleuritic or ex cp Doe = MMRC4  = sob if tries to leave home or while getting dressed   Some better with saba rec Plan A = Automatic = Advair 115 Take 2 puffs first thing in am and then another 2 puffs about 12 hours later.                                      Spiriva respimat 2 pffs each am  Plan B = Backup Only use your albuterol as a rescue medication to be used if you can't catch your breath by resting or doing a relaxed purse lip breathing pattern.  Stop lisinopril and start diovan (valsartan) 80 mg daily  The key is to stop smoking completely before smoking completely stops you - at least cut down if you can.       03/29/2016  f/u ov/Avaleigh Dougherty re: COPD III/ AB /still smoking on advair /spiriva respimat still actively smoking  Chief Complaint    Patient presents with  . Follow-up    PFT's done today. He still smokes 1 ppd. He states that he is interested in quitting. His breathing is unchanged since the last visit.   cough is better, amble to walk full wm = MMRC2 = can't walk a nl pace on a flat grade s sob but does fine slow and flat  Only using albuterol when over does it  rec Stop advair and symbicort 160 Take 2 puffs first thing in am and then another 2 puffs about 12 hours later.  Continue spiriva 2puff each am  Call me if problems acquiring spiriva  The key is to stop smoking completely before smoking completely stops you - it's not too late     01/10/2017  f/u ov/Matthew Dougherty re: Copd III / AB  / still actively smoking / ran out of symb/spiriva ? when Chief Complaint  Patient presents with  . Follow-up    Breathing has been worse "since the last time I had a lung infection"- march 2018. He continues to smoke.   mucus  is clear now after stirs in am doesn't wake him Last albuterol  4.5h and much more use since ran out of maint rx He didn't realize he could get symb for free Doe = MMRC3 = can't walk 100 yards even at a slow pace at a flat grade s stopping due to sob  RA rec Plan A = Automatic = symbicort 160 / spiriva respimat Take 2 puffs first thing in am and then another 2 puffs of just the symbicort about 12 hours later.  Plan B = Backup Only use your albuterol  Plan C = Crisis - only use your albuterol nebulizer if you first try Plan B  The key is to stop smoking completely before smoking completely stops you!    11/10/2017  f/u ov/Matthew Dougherty re: copd III/ AB plus MO and still smoking on symb 160/ spiriva maint rx Chief Complaint  Patient presents with  . Follow-up    Breathing is unchanged since the last visit. He states he is having alot of sinus pressure and PND and he feels this is what is causing his problems. He has some prod cough with yellow sputum.    Dyspnea:  Has not tried to walk on 02 yet or checked sats with  activity on 02 / previously as low as 85% reported Cough: more x dec 2018 assoc with nasal congestion better on doxy in past  Sleep: with concentrator x 3 years  SABA use:  avg two x daily  But also on alb neb with pulmocort  rec Goal is to keep the saturations above 90% during the day with exertion  Plan A = Automatic = symbicort 160 x 2 followed by spiriva first thing in am and then 12 hours repeat the symbicort Work on inhaler technique:  Plan B = Backup Only use your albuterol as a rescue medication  Plan C = Crisis - only use your albuterol nebulizer if you first try Plan B and it fails to help > ok to use the nebulizer up to every 4 hours but if start needing it regularly call for immediate appointment Work on inhaler technique:   Doxycylcine 100 mg twice daily x 10 days take before eating The key is to stop smoking completely before smoking completely stops you!  Please schedule a follow up office visit in 6 weeks, call sooner if needed with all medications /inhalers/ solutions in hand so we can verify exactly what you are taking. This includes all medications from all doctors and over the counters     05/14/19 NP rec Spiriva HandiHaler- TWO puffs (1 capsule) daily  Continue Symbicort 160- 2 puffs twice a day  Start Singulair (RX sent to pharmacy) Goal is to keep the saturations above 90% during the day with exertion  STOP smoking!!! Speak with PCP about options to managing anxiety Also may want to consider counseling       08/31/2018  f/u ov/Matthew Dougherty re:  GOLD III copd/ 02 dep worse since Dec 2019 still smoking/ ? Taking spiriva /symb/ overusing neb saba / did not bring meds/ having trouble getting down here from [facility] for ov's Chief Complaint  Patient presents with  . Follow-up    Still smoking 1 ppd. He c/o increased wheezing and cough off and on since Dec 2019. He is coughing up yellow to green sputum. He is using his nebs about 2 x per day and uses the albuterol inhaler once  per wk on average.   Dyspnea:  MMRC3 = can't  walk 100 yards even at a slow pace at a flat grade s stopping due to sob   Cough: slt yellow thick worse in ams - clears as day goes on Sleeping: 2 pillows/ bed blat  SABA use: as above     No obvious other patterns in terms ofday to day or daytime variability or assoc  mucus plugs or hemoptysis or cp or chest tightness,  or overt   hb symptoms.    Also denies any obvious fluctuation of symptoms with weather or environmental changes or other aggravating or alleviating factors except as outlined above   No unusual exposure hx or h/o childhood pna/ asthma or knowledge of premature birth.  Current Allergies, Complete Past Medical History, Past Surgical History, Family History, and Social History were reviewed in [employer] record.  ROS  The following are not active complaints unless bolded Hoarseness, sore throat, dysphagia, dental problems, itching, sneezing,  nasal congestion or discharge of excess mucus or purulent secretions, ear ache,   fever, chills, sweats, unintended wt loss or wt gain, classically pleuritic or exertional cp,  orthopnea pnd or arm/hand swelling  or leg swelling, presyncope, palpitations, abdominal pain, anorexia, nausea, vomiting, diarrhea  or change in bowel habits or change in bladder habits, change in stools or change in urine, dysuria, hematuria,  rash, arthralgias, visual complaints, headache, numbness, weakness or ataxia or problems with walking or coordination,  change in mood or  memory.        Current Meds  Medication Sig  . albuterol (PROAIR HFA) 108 (90 Base) MCG/ACT inhaler Inhale 2 puffs into the lungs every 6 (six) hours as needed for wheezing or shortness of breath.  Marland Kitchen albuterol (PROVENTIL) (2.5 MG/3ML) 0.083% nebulizer solution Take 2.5 mg by nebulization every 6 (six) hours as needed for wheezing or shortness of breath.  . budesonide (PULMICORT) 0.25 MG/2ML nebulizer solution Take 0.25  mg by nebulization 2 (two) times daily as needed.  . budesonide-formoterol (SYMBICORT) 160-4.5 MCG/ACT inhaler Inhale 2 puffs into the lungs 2 (two) times daily.  . citalopram (CELEXA) 40 MG tablet Take 40 mg by mouth daily.  . diazepam (VALIUM) 5 MG tablet   . empagliflozin (JARDIANCE) 25 MG TABS tablet Take 25 mg by mouth daily.  Marland Kitchen exenatide (BYETTA 10 MCG PEN) 10 MCG/0.04ML SOPN injection Inject 0.04 mLs (10 mcg total) into the skin 2 (two) times daily with a meal.  . gabapentin (NEURONTIN) 300 MG capsule Take 600 mg by mouth 3 (three) times daily. Reported on 01/25/2016  . glucose blood (ACCU-CHEK AVIVA) test strip 1 each by Other route 2 (two) times daily. And lancets 2/day  . HYDROcodone-acetaminophen (NORCO) 10-325 MG tablet   . Insulin Glargine (LANTUS SOLOSTAR) 100 UNIT/ML Solostar Pen Inject 150 Units into the skin every morning. And pen needles 1/day  . metFORMIN (GLUCOPHAGE) 500 MG tablet TAKE (1) TABLET TWICE DAILY.  . metoprolol succinate (TOPROL-XL) 25 MG 24 hr tablet Take 25 mg by mouth daily.  . montelukast (SINGULAIR) 10 MG tablet Take 1 tablet (10 mg total) by mouth at bedtime.  . naproxen (NAPROSYN) 500 MG tablet Take 1 tablet (500 mg total) by mouth 2 (two) times daily.  . OXYGEN 2lpm with sleep and occ with exertion [name]  . SPIRIVA RESPIMAT 2.5 MCG/ACT AERS INHALE 2 PUFFS ONCE DAILY.  Marland Kitchen topiramate (TOPAMAX) 25 MG tablet 50 mg 2 (two) times daily.   . valsartan (DIOVAN) 80 MG tablet Take 1 tablet (80 mg total) by mouth  daily.                Objective:   Physical Exam  amb obese wm rattling cough    08/31/2018       210  11/10/2017         238   01/10/2017        244  06/28/2016     249  03/29/2016       239  02/16/16 226 lb (102.5 kg)  02/08/16 229 lb 6 oz (104 kg)  01/25/16 222 lb (100.7 kg)   Vital signs reviewed - Note on arrival 02 sats  95% on RA       HEENT: nl dentition / oropharynx. Nl external ear canals without cough reflex -  Mild/mod bilateral  non-specific turbinate edema     NECK :  without JVD/Nodes/TM/ nl carotid upstrokes bilaterally   LUNGS: no acc muscle use,  Mild barrel  contour chest wall with bilateral  Insp/exp sonorous rhonchi   without cough on insp or exp maneuver and mild  Hyperresonant  to  percussion bilaterally     CV:  RRR  no s3 or murmur or increase in P2, and no edema   ABD:  soft and nontender with pos late insp Hoover's  in the supine position. No bruits or organomegaly appreciated, bowel sounds nl  MS:   Nl gait/  ext warm without deformities, calf tenderness, cyanosis or clubbing No obvious joint restrictions   SKIN: warm and dry without lesions    NEURO:  alert, approp, nl sensorium with  no motor or cerebellar deficits apparent.                    Assessment & Plan:

## 2018-08-31 NOTE — Patient Instructions (Addendum)
Work on inhaler technique:  relax and gently blow all the way out then take a nice smooth deep breath back in, triggering the inhaler at same time you start breathing in.  Hold for up to 5 seconds if you can. Blow symbicort  out thru nose. Rinse and gargle with water when done  The key is to stop smoking completely before smoking completely stops you! -  For smoking cessation counseling/ coordination  call [phone]    If not doing better we may need to change you over to the same combination entirely in a nebulizer = performist/yupelri/budesonide    We will see you back here on the next trip to see [facility]

## 2018-09-02 ENCOUNTER — Encounter: Payer: Self-pay | Admitting: Internal Medicine

## 2018-09-02 NOTE — Assessment & Plan Note (Signed)
Counseled re importance of smoking cessation but did not meet time criteria for separate billing    I had an extended discussion with the patient reviewing all relevant studies completed to date and  lasting 15 to 20 minutes of a 25 minute visit    See device teaching which extended face to face time for this visit.  Each maintenance medication was reviewed in detail including emphasizing most importantly the difference between maintenance and prns and under what circumstances the prns are to be triggered using an action plan format that is not reflected in the computer generated alphabetically organized AVS which I have not found useful in most complex patients, especially with respiratory illnesses  Please see AVS for specific instructions unique to this visit that I personally wrote and verbalized to the the pt in detail and then reviewed with pt  by my nurse highlighting any  changes in therapy recommended at today's visit to their plan of care.     F/u  q 3 m to correlate with trips to see his DM doctor here.

## 2018-09-02 NOTE — Assessment & Plan Note (Signed)
Active smoker PFTs 05/06/14   FEV1  1.12 (25%) ratio 65 with 23% improvement p saba in SVC and dlco 62% corrects to 102%  FEV1/SVC = 46%   - 02/16/2016 changed spiriva to respimat/ d/c'd acei  - alpha one screen 02/16/2016 >  MM - PFT's  03/29/2016  FEV1 2.07 (48 % ) ratio 63  p 39 % improvement from saba p nothing prior to study with DLCO  66 % corrects to 102  % for alv volume   - 03/29/2016   try symb 160 2bid instead of advair - 05/13/2018 Add Singulair. Spiriva changed back to handihaler d/t insurance coverage.  - 08/31/2018  After extensive coaching inhaler device,  effectiveness =    75% hfa   Group D in terms of symptom/risk and laba/lama/ICS  therefore appropriate rx at this point >>>  Continue symb/spiriva for now   Despite actively smoking, his baseline insp technique was not very good and will need to have low threshold to change to Neb equivalent for symb/spiriva

## 2018-09-14 ENCOUNTER — Other Ambulatory Visit: Payer: Self-pay | Admitting: Endocrinology

## 2018-09-15 ENCOUNTER — Telehealth: Payer: Self-pay | Admitting: Endocrinology

## 2018-09-15 ENCOUNTER — Ambulatory Visit: Payer: Medicaid Other | Admitting: Endocrinology

## 2018-09-15 NOTE — Telephone Encounter (Signed)
[facility] - [place], [place] - [address] metFORMIN (GLUCOPHAGE) 500 MG tablet 60 tablet 0 09/15/2018    Sig: TAKE (1) TABLET TWICE DAILY.   Sent to pharmacy as: metFORMIN (GLUCOPHAGE) 500 MG tablet   E-Prescribing Status: Receipt confirmed by pharmacy (09/15/2018  7:18 AM EDT)    Already sent this morning as indicated above. Called pt and advised to follow up with his pharmacy re: status if refill

## 2018-09-15 NOTE — Telephone Encounter (Signed)
MEDICATION: metFORMIN (GLUCOPHAGE) 500 MG tablet  PHARMACY:  [facility] - [place], [place] - [address]  IS THIS A 90 DAY SUPPLY :   IS PATIENT OUT OF MEDICATION:  Yes.  IF NOT; HOW MUCH IS LEFT:   LAST APPOINTMENT DATE: @3 /03/2019  NEXT APPOINTMENT DATE:@3 /17/2020  DO WE HAVE YOUR PERMISSION TO LEAVE A DETAILED MESSAGE:  OTHER COMMENTS:    **Let patient know to contact pharmacy at the end of the day to make sure medication is ready. **  ** Please notify patient to allow 48-72 hours to process**  **Encourage patient to contact the pharmacy for refills or they can request refills through [facility]**

## 2018-09-22 ENCOUNTER — Other Ambulatory Visit: Payer: Self-pay

## 2018-09-22 ENCOUNTER — Ambulatory Visit (INDEPENDENT_AMBULATORY_CARE_PROVIDER_SITE_OTHER): Payer: Medicaid Other | Admitting: Endocrinology

## 2018-09-22 ENCOUNTER — Encounter: Payer: Self-pay | Admitting: Endocrinology

## 2018-09-22 VITALS — BP 92/60 | HR 107 | Ht 71.0 in | Wt 208.8 lb

## 2018-09-22 DIAGNOSIS — R200 Anesthesia of skin: Secondary | ICD-10-CM | POA: Diagnosis not present

## 2018-09-22 DIAGNOSIS — E1165 Type 2 diabetes mellitus with hyperglycemia: Secondary | ICD-10-CM | POA: Diagnosis not present

## 2018-09-22 DIAGNOSIS — Z794 Long term (current) use of insulin: Secondary | ICD-10-CM

## 2018-09-22 DIAGNOSIS — IMO0002 Reserved for concepts with insufficient information to code with codable children: Secondary | ICD-10-CM

## 2018-09-22 DIAGNOSIS — E118 Type 2 diabetes mellitus with unspecified complications: Secondary | ICD-10-CM | POA: Diagnosis not present

## 2018-09-22 LAB — POCT GLYCOSYLATED HEMOGLOBIN (HGB A1C): Hemoglobin A1C: 9.5 % — AB (ref 4.0–5.6)

## 2018-09-22 MED ORDER — METFORMIN HCL ER 500 MG PO TB24: 2000.0000 mg | ORAL_TABLET | Freq: Every day | ORAL | 11 refills | Status: DC

## 2018-09-22 MED ORDER — INSULIN GLARGINE 100 UNIT/ML SOLOSTAR PEN: 170.0000 [IU] | PEN_INJECTOR | SUBCUTANEOUS | 11 refills | Status: DC

## 2018-09-22 NOTE — Progress Notes (Signed)
Subjective:    Patient ID: Matthew Dougherty, male    DOB: 1976/06/21, 43 y.o.   MRN: [id number]  HPI Pt returns for f/u of diabetes mellitus: DM type: Insulin-requiring type 2.   Dx'ed: 0867 Complications: polyneuropathy Therapy: insulin since 2009, [place], and 2 oral meds.   DKA: never Severe hypoglycemia: never.  Pancreatitis: never.  Pancreatic imaging: never.  Other: he did not tolerate novolog (cramps); he takes intermitt prednisone, for COPD; wife declines for him to resume invokana (proteinuria); he declines multiple daily injections Interval history: no cbg record, but states cbg's vary from 125-250.  It is in general higher as the day goes on.  He recently got more steroid injections into the right hip.  He also has recently had oral steroids for AB.  Past Medical History:  Diagnosis Date  . Arthritis   . Asthma   . Cancer ([place])    kidney  . Chronic kidney disease    partial removal of left kidney; cancer  . Chronic knee pain   . COPD (chronic obstructive pulmonary disease) ([place])   . Depression   . Diabetes mellitus without complication ([place])   . Gout   . Hypertension   . Neuropathy   . Osteoarthritis   . Renal cancer ([place])    2010 left partial nephrectomy  . Restless leg syndrome   . Seizures ([place])    few years ago; states that he aspirated and had a seizure; no meds for seizures and none since then    Past Surgical History:  Procedure Laterality Date  . KNEE SURGERY Left    as child; removal of straight pin from under knee cap.  Marland Kitchen PARTIAL NEPHRECTOMY Left   . pinched nerves in back    . SEPTOPLASTY      Social History   Socioeconomic History  . Marital status: Unknown    Spouse name: Not on file  . Number of children: Not on file  . Years of education: Not on file  . Highest education level: Not on file  Occupational History  . Not on file  Social Needs  . Financial resource strain: Not on file  . Food insecurity:    Worry: Not on file   Inability: Not on file  . Transportation needs:    Medical: Not on file    Non-medical: Not on file  Tobacco Use  . Smoking status: Current Every Day Smoker    Packs/day: 1.00    Years: 31.00    Pack years: 31.00    Types: Cigarettes  . Smokeless tobacco: Never Used  Substance and Sexual Activity  . Alcohol use: Yes    Comment: occasional  . Drug use: No  . Sexual activity: Yes  Lifestyle  . Physical activity:    Days per week: Not on file    Minutes per session: Not on file  . Stress: Not on file  Relationships  . Social connections:    Talks on phone: Not on file    Gets together: Not on file    Attends religious service: Not on file    Active member of club or organization: Not on file    Attends meetings of clubs or organizations: Not on file    Relationship status: Not on file  . Intimate partner violence:    Fear of current or ex partner: Not on file    Emotionally abused: Not on file    Physically abused: Not on file    Forced  sexual activity: Not on file  Other Topics Concern  . Not on file  Social History Narrative   Works as a Dealer, Careers adviser    Current Outpatient Medications on File Prior to Visit  Medication Sig Dispense Refill  . albuterol (PROAIR HFA) 108 (90 Base) MCG/ACT inhaler Inhale 2 puffs into the lungs every 6 (six) hours as needed for wheezing or shortness of breath. 1 Inhaler 4  . albuterol (PROVENTIL) (2.5 MG/3ML) 0.083% nebulizer solution Take 2.5 mg by nebulization every 6 (six) hours as needed for wheezing or shortness of breath.    . budesonide (PULMICORT) 0.25 MG/2ML nebulizer solution Take 0.25 mg by nebulization 2 (two) times daily as needed.    . budesonide-formoterol (SYMBICORT) 160-4.5 MCG/ACT inhaler Inhale 2 puffs into the lungs 2 (two) times daily. 10.2 g 5  . citalopram (CELEXA) 40 MG tablet Take 40 mg by mouth daily.    . diazepam (VALIUM) 5 MG tablet   0  . empagliflozin (JARDIANCE) 25 MG TABS tablet Take 25 mg by  mouth daily. 30 tablet 11  . exenatide (BYETTA 10 MCG PEN) 10 MCG/0.04ML SOPN injection Inject 0.04 mLs (10 mcg total) into the skin 2 (two) times daily with a meal. 1 pen 11  . gabapentin (NEURONTIN) 300 MG capsule Take 600 mg by mouth 3 (three) times daily. Reported on 01/25/2016    . glucose blood (ACCU-CHEK AVIVA) test strip 1 each by Other route 2 (two) times daily. And lancets 2/day 200 each 3  . HYDROcodone-acetaminophen (NORCO) 10-325 MG tablet   0  . metoprolol succinate (TOPROL-XL) 25 MG 24 hr tablet Take 25 mg by mouth daily.    . montelukast (SINGULAIR) 10 MG tablet Take 1 tablet (10 mg total) by mouth at bedtime. 30 tablet 11  . naproxen (NAPROSYN) 500 MG tablet Take 1 tablet (500 mg total) by mouth 2 (two) times daily. 30 tablet 0  . OXYGEN 2lpm with sleep and occ with exertion [name]    . SPIRIVA RESPIMAT 2.5 MCG/ACT AERS INHALE 2 PUFFS ONCE DAILY. 4 g 11  . topiramate (TOPAMAX) 25 MG tablet 50 mg 2 (two) times daily.   0   No current facility-administered medications on file prior to visit.     Allergies  Allergen Reactions  . Penicillins     Family History  Problem Relation Age of Onset  . Breast cancer Mother   . Melanoma Father   . Diabetes Sister     BP 92/60 (BP Location: Left Arm, Patient Position: Sitting, Cuff Size: Normal)   Pulse (!) 107   Ht 5\' 11"  (1.803 m)   Wt 208 lb 12.8 oz (94.7 kg)   SpO2 93%   BMI 29.12 kg/m    Review of Systems He denies hypoglycemia.  He has lost a few lbs, due to his efforts.      Objective:   Physical Exam VITAL SIGNS:  See vs page GENERAL: no distress Pulses: dorsalis pedis intact bilat.   MSK: no deformity of the feet CV: trace bilat leg edema.   Skin:  no ulcer on the feet.  normal color and temp on the feet. Neuro: sensation is intact to touch on the feet, but decreased from normal.    Lab Results  Component Value Date   HGBA1C 9.5 (A) 09/22/2018   Lab Results  Component Value Date   CREATININE 0.91  07/23/2018   BUN 11 07/23/2018   NA 136 07/23/2018   K 4.6 07/23/2018  CL 102 07/23/2018   CO2 22 07/23/2018       Assessment & Plan:  Insulin-requiring type 2 DM, with PN: he needs increased rx.  we discussed.  he declines to add Novolog with supper.  He again declines multiple daily injections.   Hypotension: new Hip pain: steroid shots are affecting glycemic control  Patient Instructions  I have sent a prescription to your pharmacy, to increase the Lantus Please continue the same other diabetes medications.   Please stop the Valsartan for now, and also please see your primary care provider soon, to have it rechecked.   check your blood sugar twice a day.  vary the time of day when you check, between before the 3 meals, and at bedtime.  also check if you have symptoms of your blood sugar being too high or too low.  please keep a record of the readings and bring it to your next appointment here (or you can bring the meter itself).  You can write it on any piece of paper.  please call us sooner if your blood sugar goes below 70, or if you have a lot of readings over 200.  Please come back for a follow-up appointment in 2 months.

## 2018-09-22 NOTE — Patient Instructions (Addendum)
I have sent a prescription to your pharmacy, to increase the Lantus Please continue the same other diabetes medications.   Please stop the Valsartan for now, and also please see your primary care provider soon, to have it rechecked.   check your blood sugar twice a day.  vary the time of day when you check, between before the 3 meals, and at bedtime.  also check if you have symptoms of your blood sugar being too high or too low.  please keep a record of the readings and bring it to your next appointment here (or you can bring the meter itself).  You can write it on any piece of paper.  please call us sooner if your blood sugar goes below 70, or if you have a lot of readings over 200.  Please come back for a follow-up appointment in 2 months.

## 2018-09-29 ENCOUNTER — Telehealth: Payer: Self-pay | Admitting: Endocrinology

## 2018-09-29 NOTE — Telephone Encounter (Signed)
Patient stated that he was changed to a long last Metformin and said this is messing his stomach up  He would like to be switch to the metformin that he was on before

## 2018-09-30 MED ORDER — METFORMIN HCL 500 MG PO TABS: 1000.0000 mg | ORAL_TABLET | Freq: Two times a day (BID) | ORAL | 3 refills | Status: DC

## 2018-09-30 NOTE — Telephone Encounter (Signed)
Please advise 

## 2018-09-30 NOTE — Telephone Encounter (Signed)
Ok, I have sent a prescription to your pharmacy 

## 2018-10-01 ENCOUNTER — Other Ambulatory Visit: Payer: Self-pay | Admitting: Endocrinology

## 2018-11-06 ENCOUNTER — Telehealth: Payer: Self-pay | Admitting: Endocrinology

## 2018-11-06 NOTE — Telephone Encounter (Signed)
Patient has called our office requesting Dr.Ellison to write him an RX for diabetic shoes. [facility]  Please Advise, Thanks

## 2018-11-06 NOTE — Telephone Encounter (Signed)
Called pt and made him aware. Verbalized acceptance and understanding. 

## 2018-11-06 NOTE — Telephone Encounter (Signed)
Please advise 

## 2018-11-06 NOTE — Telephone Encounter (Signed)
Sorry, pt does not qualify

## 2018-11-24 ENCOUNTER — Encounter: Payer: Self-pay | Admitting: Endocrinology

## 2018-11-25 ENCOUNTER — Other Ambulatory Visit: Payer: Self-pay

## 2018-11-25 ENCOUNTER — Ambulatory Visit (INDEPENDENT_AMBULATORY_CARE_PROVIDER_SITE_OTHER): Payer: Medicaid Other | Admitting: Endocrinology

## 2018-11-25 DIAGNOSIS — Z794 Long term (current) use of insulin: Secondary | ICD-10-CM | POA: Diagnosis not present

## 2018-11-25 DIAGNOSIS — E118 Type 2 diabetes mellitus with unspecified complications: Secondary | ICD-10-CM | POA: Diagnosis not present

## 2018-11-25 DIAGNOSIS — E1165 Type 2 diabetes mellitus with hyperglycemia: Secondary | ICD-10-CM | POA: Diagnosis not present

## 2018-11-25 DIAGNOSIS — IMO0002 Reserved for concepts with insufficient information to code with codable children: Secondary | ICD-10-CM

## 2018-11-25 NOTE — Patient Instructions (Addendum)
Please continue the same diabetes medications. check your blood sugar twice a day.  vary the time of day when you check, between before the 3 meals, and at bedtime.  also check if you have symptoms of your blood sugar being too high or too low.  please keep a record of the readings and bring it to your next appointment here (or you can bring the meter itself).  You can write it on any piece of paper.  please call us sooner if your blood sugar goes below 70, or if you have a lot of readings over 200. Please come back for a follow-up appointment in 2 months.    

## 2018-11-25 NOTE — Progress Notes (Signed)
Subjective:    Patient ID: Matthew Dougherty, male    DOB: 29-Apr-1976, 43 y.o.   MRN: [id number]  HPI  telehealth visit today via doxy video visit.  Alternatives to telehealth are presented to this patient, and the patient agrees to the telehealth visit. Pt is advised of the cost of the visit, and agrees to this, also.   Patient is at home, and I am at the office.   Persons attending the telehealth visit: the patient and I Pt returns for f/u of diabetes mellitus: DM type: Insulin-requiring type 2.   Dx'ed: 6767 Complications: polyneuropathy Therapy: insulin since 2009, Byetta, and 2 oral meds.   DKA: never Severe hypoglycemia: never.  Pancreatitis: never.  Pancreatic imaging: never.  Other: he did not tolerate novolog (cramps); he takes intermitt prednisone, for COPD; wife declines for him to resume invokana (proteinuria); he declines multiple daily injections Interval history: no cbg record, but states cbg's vary from 75-295.  He also has recently had more oral steroids for AB.  Other than while on the steroids, cbg's have been in the 100's.   Past Medical History:  Diagnosis Date  . Arthritis   . Asthma   . Cancer ([place])    kidney  . Chronic kidney disease    partial removal of left kidney; cancer  . Chronic knee pain   . COPD (chronic obstructive pulmonary disease) ([place])   . Depression   . Diabetes mellitus without complication ([place])   . Gout   . Hypertension   . Neuropathy   . Osteoarthritis   . Renal cancer ([place])    2010 left partial nephrectomy  . Restless leg syndrome   . Seizures ([place])    few years ago; states that he aspirated and had a seizure; no meds for seizures and none since then    Past Surgical History:  Procedure Laterality Date  . KNEE SURGERY Left    as child; removal of straight pin from under knee cap.  Marland Kitchen PARTIAL NEPHRECTOMY Left   . pinched nerves in back    . SEPTOPLASTY      Social History   Socioeconomic History  . Marital status:  Unknown    Spouse name: Not on file  . Number of children: Not on file  . Years of education: Not on file  . Highest education level: Not on file  Occupational History  . Not on file  Social Needs  . Financial resource strain: Not on file  . Food insecurity:    Worry: Not on file    Inability: Not on file  . Transportation needs:    Medical: Not on file    Non-medical: Not on file  Tobacco Use  . Smoking status: Current Every Day Smoker    Packs/day: 1.00    Years: 31.00    Pack years: 31.00    Types: Cigarettes  . Smokeless tobacco: Never Used  Substance and Sexual Activity  . Alcohol use: Yes    Comment: occasional  . Drug use: No  . Sexual activity: Yes  Lifestyle  . Physical activity:    Days per week: Not on file    Minutes per session: Not on file  . Stress: Not on file  Relationships  . Social connections:    Talks on phone: Not on file    Gets together: Not on file    Attends religious service: Not on file    Active member of club or organization: Not on file  Attends meetings of clubs or organizations: Not on file    Relationship status: Not on file  . Intimate partner violence:    Fear of current or ex partner: Not on file    Emotionally abused: Not on file    Physically abused: Not on file    Forced sexual activity: Not on file  Other Topics Concern  . Not on file  Social History Narrative   Works as a Dealer, Careers adviser    Current Outpatient Medications on File Prior to Visit  Medication Sig Dispense Refill  . albuterol (PROAIR HFA) 108 (90 Base) MCG/ACT inhaler Inhale 2 puffs into the lungs every 6 (six) hours as needed for wheezing or shortness of breath. 1 Inhaler 4  . albuterol (PROVENTIL) (2.5 MG/3ML) 0.083% nebulizer solution Take 2.5 mg by nebulization every 6 (six) hours as needed for wheezing or shortness of breath.    . budesonide (PULMICORT) 0.25 MG/2ML nebulizer solution Take 0.25 mg by nebulization 2 (two) times daily as  needed.    . budesonide-formoterol (SYMBICORT) 160-4.5 MCG/ACT inhaler Inhale 2 puffs into the lungs 2 (two) times daily. 10.2 g 5  . citalopram (CELEXA) 40 MG tablet Take 40 mg by mouth daily.    . diazepam (VALIUM) 5 MG tablet   0  . empagliflozin (JARDIANCE) 25 MG TABS tablet Take 25 mg by mouth daily. 30 tablet 11  . exenatide (BYETTA 10 MCG PEN) 10 MCG/0.04ML SOPN injection Inject 0.04 mLs (10 mcg total) into the skin 2 (two) times daily with a meal. 1 pen 11  . gabapentin (NEURONTIN) 300 MG capsule Take 600 mg by mouth 3 (three) times daily. Reported on 01/25/2016    . glucose blood (ACCU-CHEK AVIVA) test strip 1 each by Other route 2 (two) times daily. And lancets 2/day 200 each 3  . HYDROcodone-acetaminophen (NORCO) 10-325 MG tablet   0  . Insulin Glargine (LANTUS SOLOSTAR) 100 UNIT/ML Solostar Pen Inject 170 Units into the skin every morning. And pen needles 1/day 20 pen 11  . metFORMIN (GLUCOPHAGE) 500 MG tablet Take 2 tablets (1,000 mg total) by mouth 2 (two) times daily with a meal. 360 tablet 3  . metoprolol succinate (TOPROL-XL) 25 MG 24 hr tablet Take 25 mg by mouth daily.    . montelukast (SINGULAIR) 10 MG tablet Take 1 tablet (10 mg total) by mouth at bedtime. 30 tablet 11  . naproxen (NAPROSYN) 500 MG tablet Take 1 tablet (500 mg total) by mouth 2 (two) times daily. 30 tablet 0  . OXYGEN 2lpm with sleep and occ with exertion [name]    . SPIRIVA RESPIMAT 2.5 MCG/ACT AERS INHALE 2 PUFFS ONCE DAILY. 4 g 11  . topiramate (TOPAMAX) 25 MG tablet 50 mg 2 (two) times daily.   0   No current facility-administered medications on file prior to visit.     Allergies  Allergen Reactions  . Penicillins     Family History  Problem Relation Age of Onset  . Breast cancer Mother   . Melanoma Father   . Diabetes Sister     Review of Systems He has lost weight, due to his efforts.     Objective:   Physical Exam      Assessment & Plan:  Insulin-requiring type 2 DM, with PN.   AB: steroid rx is affecting glycemic control.    Patient Instructions  Please continue the same diabetes medications.   check your blood sugar twice a day.  vary the time of  day when you check, between before the 3 meals, and at bedtime.  also check if you have symptoms of your blood sugar being too high or too low.  please keep a record of the readings and bring it to your next appointment here (or you can bring the meter itself).  You can write it on any piece of paper.  please call us sooner if your blood sugar goes below 70, or if you have a lot of readings over 200.  Please come back for a follow-up appointment in 2 months.

## 2019-01-26 ENCOUNTER — Ambulatory Visit: Payer: Medicaid Other | Admitting: Endocrinology

## 2019-02-24 LAB — HM DIABETES EYE EXAM

## 2019-02-26 ENCOUNTER — Ambulatory Visit (HOSPITAL_COMMUNITY)
Admission: RE | Admit: 2019-02-26 | Discharge: 2019-02-26 | Disposition: A | Discharge status: Home / Self Care | Payer: Medicaid Other | Source: Ambulatory Visit | Attending: Neurology | Admitting: Neurology

## 2019-02-26 ENCOUNTER — Other Ambulatory Visit (HOSPITAL_COMMUNITY): Payer: Self-pay | Admitting: Neurology

## 2019-02-26 ENCOUNTER — Other Ambulatory Visit: Payer: Self-pay

## 2019-02-26 DIAGNOSIS — M542 Cervicalgia: Secondary | ICD-10-CM | POA: Insufficient documentation

## 2019-02-26 DIAGNOSIS — M5440 Lumbago with sciatica, unspecified side: Secondary | ICD-10-CM | POA: Insufficient documentation

## 2019-03-02 ENCOUNTER — Encounter: Payer: Self-pay | Admitting: Endocrinology

## 2019-03-02 ENCOUNTER — Ambulatory Visit (INDEPENDENT_AMBULATORY_CARE_PROVIDER_SITE_OTHER): Payer: Medicaid Other | Admitting: Endocrinology

## 2019-03-02 ENCOUNTER — Other Ambulatory Visit: Payer: Self-pay

## 2019-03-02 VITALS — BP 110/80 | HR 111 | Ht 71.0 in | Wt 189.2 lb

## 2019-03-02 DIAGNOSIS — R000 Tachycardia, unspecified: Secondary | ICD-10-CM | POA: Diagnosis not present

## 2019-03-02 DIAGNOSIS — E1142 Type 2 diabetes mellitus with diabetic polyneuropathy: Secondary | ICD-10-CM | POA: Diagnosis not present

## 2019-03-02 DIAGNOSIS — E118 Type 2 diabetes mellitus with unspecified complications: Secondary | ICD-10-CM | POA: Diagnosis not present

## 2019-03-02 DIAGNOSIS — Z794 Long term (current) use of insulin: Secondary | ICD-10-CM

## 2019-03-02 DIAGNOSIS — IMO0002 Reserved for concepts with insufficient information to code with codable children: Secondary | ICD-10-CM

## 2019-03-02 DIAGNOSIS — E1165 Type 2 diabetes mellitus with hyperglycemia: Secondary | ICD-10-CM

## 2019-03-02 LAB — TSH: TSH: 0.87 u[IU]/mL (ref 0.35–4.50)

## 2019-03-02 LAB — POCT GLYCOSYLATED HEMOGLOBIN (HGB A1C): Hemoglobin A1C: 9.1 % — AB (ref 4.0–5.6)

## 2019-03-02 MED ORDER — LANTUS SOLOSTAR 100 UNIT/ML ~~LOC~~ SOPN: 190.0000 [IU] | PEN_INJECTOR | SUBCUTANEOUS | 11 refills | Status: DC

## 2019-03-02 NOTE — Progress Notes (Signed)
Subjective:    Patient ID: Matthew Dougherty, male    DOB: 08-08-1975, 43 y.o.   MRN: [id number]  HPI Pt returns for f/u of diabetes mellitus:  DM type: Insulin-requiring type 2.   Dx'ed: 123456 Complications: polyneuropathy Therapy: insulin since 2009, Byetta, and 2 oral meds.   DKA: never Severe hypoglycemia: never.  Pancreatitis: never.  Pancreatic imaging: never.  Other: he did not tolerate novolog (cramps); he takes intermitt prednisone, for COPD; wife declines for him to resume invokana (proteinuria); he declines multiple daily injections.   Interval history: no cbg record, but states cbg's vary from 150-260.  He again took oral steroids for AB.  Other than while on the steroids, cbg's have been in the 100's.  Pt says he never misses meds Past Medical History:  Diagnosis Date  . Arthritis   . Asthma   . Cancer ([place])    kidney  . Chronic kidney disease    partial removal of left kidney; cancer  . Chronic knee pain   . COPD (chronic obstructive pulmonary disease) ([place])   . Depression   . Diabetes mellitus without complication ([place])   . Gout   . Hypertension   . Neuropathy   . Osteoarthritis   . Renal cancer ([place])    2010 left partial nephrectomy  . Restless leg syndrome   . Seizures ([place])    few years ago; states that he aspirated and had a seizure; no meds for seizures and none since then    Past Surgical History:  Procedure Laterality Date  . KNEE SURGERY Left    as child; removal of straight pin from under knee cap.  Marland Kitchen PARTIAL NEPHRECTOMY Left   . pinched nerves in back    . SEPTOPLASTY      Social History   Socioeconomic History  . Marital status: Unknown    Spouse name: Not on file  . Number of children: Not on file  . Years of education: Not on file  . Highest education level: Not on file  Occupational History  . Not on file  Social Needs  . Financial resource strain: Not on file  . Food insecurity    Worry: Not on file    Inability: Not on file   . Transportation needs    Medical: Not on file    Non-medical: Not on file  Tobacco Use  . Smoking status: Current Every Day Smoker    Packs/day: 1.00    Years: 31.00    Pack years: 31.00    Types: Cigarettes  . Smokeless tobacco: Never Used  Substance and Sexual Activity  . Alcohol use: Yes    Comment: occasional  . Drug use: No  . Sexual activity: Yes  Lifestyle  . Physical activity    Days per week: Not on file    Minutes per session: Not on file  . Stress: Not on file  Relationships  . Social Herbalist on phone: Not on file    Gets together: Not on file    Attends religious service: Not on file    Active member of club or organization: Not on file    Attends meetings of clubs or organizations: Not on file    Relationship status: Not on file  . Intimate partner violence    Fear of current or ex partner: Not on file    Emotionally abused: Not on file    Physically abused: Not on file    Forced  sexual activity: Not on file  Other Topics Concern  . Not on file  Social History Narrative   Works as a Dealer, Careers adviser    Current Outpatient Medications on File Prior to Visit  Medication Sig Dispense Refill  . albuterol (PROAIR HFA) 108 (90 Base) MCG/ACT inhaler Inhale 2 puffs into the lungs every 6 (six) hours as needed for wheezing or shortness of breath. 1 Inhaler 4  . albuterol (PROVENTIL) (2.5 MG/3ML) 0.083% nebulizer solution Take 2.5 mg by nebulization every 6 (six) hours as needed for wheezing or shortness of breath.    . budesonide (PULMICORT) 0.25 MG/2ML nebulizer solution Take 0.25 mg by nebulization 2 (two) times daily as needed.    . budesonide-formoterol (SYMBICORT) 160-4.5 MCG/ACT inhaler Inhale 2 puffs into the lungs 2 (two) times daily. 10.2 g 5  . busPIRone (BUSPAR) 7.5 MG tablet Take 7.5 mg by mouth 2 (two) times daily as needed.    . Butalbital-APAP-Caffeine 50-300-40 MG CAPS Take 1 capsule by mouth every 4 (four) hours as needed.     . citalopram (CELEXA) 40 MG tablet Take 40 mg by mouth daily.    . diazepam (VALIUM) 5 MG tablet   0  . empagliflozin (JARDIANCE) 25 MG TABS tablet Take 25 mg by mouth daily. 30 tablet 11  . exenatide (BYETTA 10 MCG PEN) 10 MCG/0.04ML SOPN injection Inject 0.04 mLs (10 mcg total) into the skin 2 (two) times daily with a meal. 1 pen 11  . gabapentin (NEURONTIN) 300 MG capsule Take 600 mg by mouth 3 (three) times daily. Reported on 01/25/2016    . glucose blood (ACCU-CHEK AVIVA) test strip 1 each by Other route 2 (two) times daily. And lancets 2/day 200 each 3  . HYDROcodone-acetaminophen (NORCO) 10-325 MG tablet   0  . metFORMIN (GLUCOPHAGE) 500 MG tablet Take 2 tablets (1,000 mg total) by mouth 2 (two) times daily with a meal. 360 tablet 3  . metoprolol succinate (TOPROL-XL) 25 MG 24 hr tablet Take 25 mg by mouth daily.    . montelukast (SINGULAIR) 10 MG tablet Take 1 tablet (10 mg total) by mouth at bedtime. 30 tablet 11  . naproxen (NAPROSYN) 500 MG tablet Take 1 tablet (500 mg total) by mouth 2 (two) times daily. 30 tablet 0  . OXYGEN 2lpm with sleep and occ with exertion [name]    . SPIRIVA RESPIMAT 2.5 MCG/ACT AERS INHALE 2 PUFFS ONCE DAILY. 4 g 11  . topiramate (TOPAMAX) 25 MG tablet 50 mg 2 (two) times daily.   0   No current facility-administered medications on file prior to visit.     Allergies  Allergen Reactions  . Penicillins     Family History  Problem Relation Age of Onset  . Breast cancer Mother   . Melanoma Father   . Diabetes Sister     BP 110/80 (BP Location: Left Arm, Patient Position: Sitting, Cuff Size: Normal)   Pulse (!) 111   Ht 5\' 11"  (1.803 m)   Wt 189 lb 3.2 oz (85.8 kg)   SpO2 93%   BMI 26.39 kg/m   Review of Systems He denies hypoglycemia.      Objective:   Physical Exam VITAL SIGNS:  See vs page GENERAL: no distress Pulses: dorsalis pedis intact bilat.   MSK: no deformity of the feet CV: no leg edema Skin:  no ulcer on the feet.  normal  color and temp on the feet. Neuro: sensation is intact to touch on the  feet.   Lab Results  Component Value Date   HGBA1C 9.1 (A) 03/02/2019   Lab Results  Component Value Date   CREATININE 0.91 07/23/2018   BUN 11 07/23/2018   NA 136 07/23/2018   K 4.6 07/23/2018   CL 102 07/23/2018   CO2 22 07/23/2018       Assessment & Plan:  Insulin-requiring type 2 DM:, with PN: he needs increased rx.  tachycardia, new to me.  Check TSH.   Patient Instructions  I have sent a prescription to your pharmacy, to increase the Lantus to 190 units each morning. Please continue the same other diabetes medications.   check your blood sugar twice a day.  vary the time of day when you check, between before the 3 meals, and at bedtime.  also check if you have symptoms of your blood sugar being too high or too low.  please keep a record of the readings and bring it to your next appointment here (or you can bring the meter itself).  You can write it on any piece of paper.  please call us sooner if your blood sugar goes below 70, or if you have a lot of readings over 200.  Please come back for a follow-up appointment in 2 months.

## 2019-03-02 NOTE — Patient Instructions (Addendum)
I have sent a prescription to your pharmacy, to increase the Lantus to 190 units each morning. Please continue the same other diabetes medications.   check your blood sugar twice a day.  vary the time of day when you check, between before the 3 meals, and at bedtime.  also check if you have symptoms of your blood sugar being too high or too low.  please keep a record of the readings and bring it to your next appointment here (or you can bring the meter itself).  You can write it on any piece of paper.  please call us sooner if your blood sugar goes below 70, or if you have a lot of readings over 200.  Please come back for a follow-up appointment in 2 months.

## 2019-03-12 ENCOUNTER — Other Ambulatory Visit: Payer: Self-pay | Admitting: Endocrinology

## 2019-04-29 ENCOUNTER — Other Ambulatory Visit: Payer: Self-pay

## 2019-05-03 ENCOUNTER — Ambulatory Visit: Payer: Medicaid Other | Admitting: Endocrinology

## 2019-05-06 ENCOUNTER — Encounter: Payer: Self-pay | Admitting: Endocrinology

## 2019-05-07 NOTE — Telephone Encounter (Signed)
Please refer to Dr. Ellison's response 

## 2019-05-17 ENCOUNTER — Ambulatory Visit (INDEPENDENT_AMBULATORY_CARE_PROVIDER_SITE_OTHER): Payer: Medicaid Other | Admitting: Endocrinology

## 2019-05-17 ENCOUNTER — Encounter: Payer: Self-pay | Admitting: Endocrinology

## 2019-05-17 ENCOUNTER — Other Ambulatory Visit: Payer: Self-pay

## 2019-05-17 VITALS — Ht 71.0 in

## 2019-05-17 DIAGNOSIS — E118 Type 2 diabetes mellitus with unspecified complications: Secondary | ICD-10-CM | POA: Diagnosis not present

## 2019-05-17 DIAGNOSIS — Z794 Long term (current) use of insulin: Secondary | ICD-10-CM

## 2019-05-17 DIAGNOSIS — E1165 Type 2 diabetes mellitus with hyperglycemia: Secondary | ICD-10-CM | POA: Diagnosis not present

## 2019-05-17 DIAGNOSIS — IMO0002 Reserved for concepts with insufficient information to code with codable children: Secondary | ICD-10-CM

## 2019-05-17 MED ORDER — SILDENAFIL CITRATE 100 MG PO TABS: 50.0000 mg | ORAL_TABLET | Freq: Every day | ORAL | 11 refills | Status: DC | PRN

## 2019-05-17 MED ORDER — BYDUREON BCISE 2 MG/0.85ML ~~LOC~~ AUIJ: 2.0000 mg | AUTO-INJECTOR | SUBCUTANEOUS | 11 refills | Status: AC

## 2019-05-17 NOTE — Patient Instructions (Addendum)
Please continue the same medications.   When you have blood drawn soon, please make sure they check the A1c.  They will send results here.  check your blood sugar twice a day.  vary the time of day when you check, between before the 3 meals, and at bedtime.  also check if you have symptoms of your blood sugar being too high or too low.  please keep a record of the readings and bring it to your next appointment here (or you can bring the meter itself).  You can write it on any piece of paper.  please call us sooner if your blood sugar goes below 70, or if you have a lot of readings over 200. I have sent a prescription to your pharmacy, for the Viagra.   Please come back for a follow-up appointment in 3 months.

## 2019-05-17 NOTE — Progress Notes (Signed)
Subjective:    Patient ID: Matthew Dougherty, male    DOB: 08/27/1975, 43 y.o.   MRN: [id number]  HPI  telehealth visit today via doxy video visit.  Alternatives to telehealth are presented to this patient, and the patient agrees to the telehealth visit.   Pt is advised of the cost of the visit, and agrees to this, also.   Patient is at home, and I am at the office.   Persons attending the telehealth visit: the patient and I Pt returns for f/u of diabetes mellitus:  DM type: Insulin-requiring type 2.   Dx'ed: 123456 Complications: polyneuropathy Therapy: insulin since 2009, Bydureon, and 2 oral meds.   DKA: never Severe hypoglycemia: never.  Pancreatitis: never.  Pancreatic imaging: never.  Other: he did not tolerate novolog (cramps); he takes intermitt prednisone, for COPD; wife declines for him to resume invokana (proteinuria); he declines multiple daily injections.   Interval history: He once again took oral steroids for AB.  Pt says he never misses meds. PCP changed byetta to bydureon.  Pt says cbg varies from 75-185.  pt states he feels well in general.  He will have labs at PCP soon.   Past Medical History:  Diagnosis Date  . Arthritis   . Asthma   . Cancer ([place])    kidney  . Chronic kidney disease    partial removal of left kidney; cancer  . Chronic knee pain   . COPD (chronic obstructive pulmonary disease) ([place])   . Depression   . Diabetes mellitus without complication ([place])   . Gout   . Hypertension   . Neuropathy   . Osteoarthritis   . Renal cancer ([place])    2010 left partial nephrectomy  . Restless leg syndrome   . Seizures ([place])    few years ago; states that he aspirated and had a seizure; no meds for seizures and none since then    Past Surgical History:  Procedure Laterality Date  . KNEE SURGERY Left    as child; removal of straight pin from under knee cap.  Marland Kitchen PARTIAL NEPHRECTOMY Left   . pinched nerves in back    . SEPTOPLASTY      Social History    Socioeconomic History  . Marital status: Unknown    Spouse name: Not on file  . Number of children: Not on file  . Years of education: Not on file  . Highest education level: Not on file  Occupational History  . Not on file  Social Needs  . Financial resource strain: Not on file  . Food insecurity    Worry: Not on file    Inability: Not on file  . Transportation needs    Medical: Not on file    Non-medical: Not on file  Tobacco Use  . Smoking status: Current Every Day Smoker    Packs/day: 1.00    Years: 31.00    Pack years: 31.00    Types: Cigarettes  . Smokeless tobacco: Never Used  Substance and Sexual Activity  . Alcohol use: Yes    Comment: occasional  . Drug use: No  . Sexual activity: Yes  Lifestyle  . Physical activity    Days per week: Not on file    Minutes per session: Not on file  . Stress: Not on file  Relationships  . Social Herbalist on phone: Not on file    Gets together: Not on file    Attends religious service:  Not on file    Active member of club or organization: Not on file    Attends meetings of clubs or organizations: Not on file    Relationship status: Not on file  . Intimate partner violence    Fear of current or ex partner: Not on file    Emotionally abused: Not on file    Physically abused: Not on file    Forced sexual activity: Not on file  Other Topics Concern  . Not on file  Social History Narrative   Works as a Dealer, Careers adviser    Current Outpatient Medications on File Prior to Visit  Medication Sig Dispense Refill  . ACCU-CHEK AVIVA PLUS test strip CHECK BLOOD SUGAR TWICE DAILY. 50 strip 0  . albuterol (PROAIR HFA) 108 (90 Base) MCG/ACT inhaler Inhale 2 puffs into the lungs every 6 (six) hours as needed for wheezing or shortness of breath. 1 Inhaler 4  . albuterol (PROVENTIL) (2.5 MG/3ML) 0.083% nebulizer solution Take 2.5 mg by nebulization every 6 (six) hours as needed for wheezing or shortness of breath.     . budesonide (PULMICORT) 0.25 MG/2ML nebulizer solution Take 0.25 mg by nebulization 2 (two) times daily as needed.    . budesonide-formoterol (SYMBICORT) 160-4.5 MCG/ACT inhaler Inhale 2 puffs into the lungs 2 (two) times daily. 10.2 g 5  . busPIRone (BUSPAR) 7.5 MG tablet Take 7.5 mg by mouth 2 (two) times daily as needed.    . Butalbital-APAP-Caffeine 50-300-40 MG CAPS Take 1 capsule by mouth every 4 (four) hours as needed.    . citalopram (CELEXA) 40 MG tablet Take 40 mg by mouth daily.    . diazepam (VALIUM) 5 MG tablet   0  . empagliflozin (JARDIANCE) 25 MG TABS tablet Take 25 mg by mouth daily. 30 tablet 11  . gabapentin (NEURONTIN) 300 MG capsule Take 600 mg by mouth 3 (three) times daily. Reported on 01/25/2016    . HYDROcodone-acetaminophen (NORCO) 10-325 MG tablet   0  . Insulin Glargine (LANTUS SOLOSTAR) 100 UNIT/ML Solostar Pen Inject 190 Units into the skin every morning. And pen needles 1/day 25 pen 11  . metFORMIN (GLUCOPHAGE) 500 MG tablet Take 2 tablets (1,000 mg total) by mouth 2 (two) times daily with a meal. 360 tablet 3  . metoprolol succinate (TOPROL-XL) 25 MG 24 hr tablet Take 25 mg by mouth daily.    . montelukast (SINGULAIR) 10 MG tablet Take 1 tablet (10 mg total) by mouth at bedtime. 30 tablet 11  . naproxen (NAPROSYN) 500 MG tablet Take 1 tablet (500 mg total) by mouth 2 (two) times daily. 30 tablet 0  . OXYGEN 2lpm with sleep and occ with exertion [name]    . SPIRIVA RESPIMAT 2.5 MCG/ACT AERS INHALE 2 PUFFS ONCE DAILY. 4 g 11  . topiramate (TOPAMAX) 25 MG tablet 50 mg 2 (two) times daily.   0   No current facility-administered medications on file prior to visit.     Allergies  Allergen Reactions  . Penicillins     Family History  Problem Relation Age of Onset  . Breast cancer Mother   . Melanoma Father   . Diabetes Sister     Ht 5\' 11"  (1.803 m)   BMI 26.39 kg/m   Review of Systems She denies hypoglycemia.  He has ED sxs (Viagra helps).  He  denies nausea.      Objective:   Physical Exam      Assessment & Plan:  Insulin-requiring type  2 DM, with PN: uncertain glycemic control.  ED: well-controlled.  AB: steroid rx will affect upcoming A1c  Patient Instructions  Please continue the same medications.   When you have blood drawn soon, please make sure they check the A1c.  They will send results here.  check your blood sugar twice a day.  vary the time of day when you check, between before the 3 meals, and at bedtime.  also check if you have symptoms of your blood sugar being too high or too low.  please keep a record of the readings and bring it to your next appointment here (or you can bring the meter itself).  You can write it on any piece of paper.  please call us sooner if your blood sugar goes below 70, or if you have a lot of readings over 200. I have sent a prescription to your pharmacy, for the Viagra.   Please come back for a follow-up appointment in 3 months.

## 2019-06-15 ENCOUNTER — Telehealth: Payer: Self-pay | Admitting: Endocrinology

## 2019-06-15 ENCOUNTER — Other Ambulatory Visit: Payer: Self-pay

## 2019-06-15 DIAGNOSIS — E1165 Type 2 diabetes mellitus with hyperglycemia: Secondary | ICD-10-CM

## 2019-06-15 DIAGNOSIS — IMO0002 Reserved for concepts with insufficient information to code with codable children: Secondary | ICD-10-CM

## 2019-06-15 MED ORDER — ACCU-CHEK AVIVA PLUS VI STRP: 1.0000 | ORAL_STRIP | Freq: Two times a day (BID) | 2 refills | Status: AC

## 2019-06-15 MED ORDER — ACCU-CHEK AVIVA PLUS W/DEVICE KIT: 1.0000 | PACK | Freq: Two times a day (BID) | 0 refills | Status: AC

## 2019-06-15 MED ORDER — METFORMIN HCL 500 MG PO TABS: 1000.0000 mg | ORAL_TABLET | Freq: Two times a day (BID) | ORAL | 2 refills | Status: DC

## 2019-06-15 MED ORDER — ACCU-CHEK FASTCLIX LANCETS MISC: 1.0000 | Freq: Two times a day (BID) | 2 refills | Status: AC

## 2019-06-15 NOTE — Telephone Encounter (Signed)
E-Prescribing Status: Receipt confirmed by pharmacy (06/15/2019 11:12 AM EST)

## 2019-06-15 NOTE — Telephone Encounter (Signed)
MEDICATION: New Meter, Strips, lancing devise and lancets    Metformin 500 MG  PHARMACY:  [facility] in [place] :   IS PATIENT OUT OF MEDICATION: yes, his meter etc were damage  IF NOT; HOW MUCH IS LEFT: 1 Metformin tablet - test kit is inoperable  LAST APPOINTMENT DATE: @11 /03/2019  NEXT APPOINTMENT DATE:@Visit  date not found  DO WE HAVE YOUR PERMISSION TO LEAVE A DETAILED MESSAGE: yes [phone]  OTHER COMMENTS: Patient also wants to know if the labs he had done at [facility] had been received by  Dr Loanne Drilling. Would like a call to confirm receipt or non-receipt   **Let patient know to contact pharmacy at the end of the day to make sure medication is ready. **  ** Please notify patient to allow 48-72 hours to process**  **Encourage patient to contact the pharmacy for refills or they can request refills through [facility]**

## 2019-07-24 ENCOUNTER — Other Ambulatory Visit: Payer: Self-pay | Admitting: Endocrinology

## 2019-09-06 ENCOUNTER — Other Ambulatory Visit: Payer: Self-pay | Admitting: Endocrinology

## 2019-09-06 NOTE — Telephone Encounter (Signed)
Please advise 

## 2019-09-06 NOTE — Telephone Encounter (Signed)
1.  Please schedule f/u appt 2.  Then please refill x 1, pending that appt.  

## 2019-09-13 ENCOUNTER — Other Ambulatory Visit: Payer: Self-pay | Admitting: Endocrinology

## 2019-09-14 ENCOUNTER — Other Ambulatory Visit: Payer: Self-pay | Admitting: Endocrinology

## 2019-09-14 ENCOUNTER — Telehealth: Payer: Self-pay | Admitting: Endocrinology

## 2019-09-14 NOTE — Telephone Encounter (Signed)
MEDICATION: jardiance  PHARMACY:   [facility], [place] Phone:  [phone]  Fax:  [phone]     IS THIS A 90 DAY SUPPLY : no, 30   IS PATIENT OUT OF MEDICATION: yes  IF NOT; HOW MUCH IS LEFT:   LAST APPOINTMENT DATE: 05/17/19  NEXT APPOINTMENT DATE: 09/21/19  DO WE HAVE YOUR PERMISSION TO LEAVE A DETAILED MESSAGE: yes  OTHER COMMENTS:    **Let patient know to contact pharmacy at the end of the day to make sure medication is ready. **  ** Please notify patient to allow 48-72 hours to process**  **Encourage patient to contact the pharmacy for refills or they can request refills through [facility]**

## 2019-09-14 NOTE — Telephone Encounter (Signed)
Outpatient Medication Detail   Disp Refills Start End   JARDIANCE 25 MG TABS tablet 30 tablet 0 09/14/2019    Sig: TAKE 1 TABLET BY MOUTH ONCE DAILY.   Sent to pharmacy as: JARDIANCE 25 MG Tab tablet   E-Prescribing Status: Receipt confirmed by pharmacy (09/14/2019 12:56 PM EST)

## 2019-09-21 ENCOUNTER — Other Ambulatory Visit: Payer: Self-pay

## 2019-09-21 ENCOUNTER — Ambulatory Visit (INDEPENDENT_AMBULATORY_CARE_PROVIDER_SITE_OTHER): Payer: Medicaid Other | Admitting: Endocrinology

## 2019-09-21 DIAGNOSIS — E1165 Type 2 diabetes mellitus with hyperglycemia: Secondary | ICD-10-CM

## 2019-09-21 DIAGNOSIS — Z794 Long term (current) use of insulin: Secondary | ICD-10-CM | POA: Diagnosis not present

## 2019-09-21 DIAGNOSIS — E118 Type 2 diabetes mellitus with unspecified complications: Secondary | ICD-10-CM | POA: Diagnosis not present

## 2019-09-21 DIAGNOSIS — IMO0002 Reserved for concepts with insufficient information to code with codable children: Secondary | ICD-10-CM

## 2019-09-21 MED ORDER — BYETTA 10 MCG PEN 10 MCG/0.04ML ~~LOC~~ SOPN: 10.0000 ug | PEN_INJECTOR | Freq: Two times a day (BID) | SUBCUTANEOUS | 11 refills | Status: AC

## 2019-09-21 MED ORDER — FLUCONAZOLE 150 MG PO TABS: 150.0000 mg | ORAL_TABLET | Freq: Every day | ORAL | 5 refills | Status: DC

## 2019-09-21 NOTE — Progress Notes (Signed)
Subjective:    Patient ID: Matthew Dougherty, male    DOB: 1976/02/16, 44 y.o.   MRN: [id number]  HPI telehealth visit today via doxy video visit.  Alternatives to telehealth are presented to this patient, and the patient agrees to the telehealth visit.   Pt is advised of the cost of the visit, and agrees to this, also.   Patient is at home, and I am at the office.   Persons attending the telehealth visit: the patient and I Pt returns for f/u of diabetes mellitus:  DM type: Insulin-requiring type 2.   Dx'ed: 7673 Complications: polyneuropathy Therapy: insulin since 2009, Bydureon, Byetta, and 2 oral meds.   DKA: never Severe hypoglycemia: never.  Pancreatitis: never.  Pancreatic imaging: never.  Other: he did not tolerate novolog (cramps); he takes intermitt prednisone, for COPD; wife declines for him to resume invokana (proteinuria); he declines multiple daily injections.   Interval history: last steroids were 6 weeks ago, for AB.  Pt says he never misses meds. Pt says cbg varies from 75-185.  pt states he feels well in general.  He takes just 80-180 units/d of Lantus (he reduced due to hypoglycemia).  He varies according to cbg, but he usually takes 80.  He takes both byetta and bydureon.  He will have A1c soon at [facility].   Past Medical History:  Diagnosis Date  . Arthritis   . Asthma   . Cancer ([place])    kidney  . Chronic kidney disease    partial removal of left kidney; cancer  . Chronic knee pain   . COPD (chronic obstructive pulmonary disease) ([place])   . Depression   . Diabetes mellitus without complication ([place])   . Gout   . Hypertension   . Neuropathy   . Osteoarthritis   . Renal cancer ([place])    2010 left partial nephrectomy  . Restless leg syndrome   . Seizures ([place])    few years ago; states that he aspirated and had a seizure; no meds for seizures and none since then    Past Surgical History:  Procedure Laterality Date  . KNEE SURGERY Left    as child;  removal of straight pin from under knee cap.  Marland Kitchen PARTIAL NEPHRECTOMY Left   . pinched nerves in back    . SEPTOPLASTY      Social History   Socioeconomic History  . Marital status: Unknown    Spouse name: Not on file  . Number of children: Not on file  . Years of education: Not on file  . Highest education level: Not on file  Occupational History  . Not on file  Tobacco Use  . Smoking status: Current Every Day Smoker    Packs/day: 1.00    Years: 31.00    Pack years: 31.00    Types: Cigarettes  . Smokeless tobacco: Never Used  Substance and Sexual Activity  . Alcohol use: Yes    Comment: occasional  . Drug use: No  . Sexual activity: Yes  Other Topics Concern  . Not on file  Social History Narrative   Works as a Dealer, Careers adviser   Social Determinants of Radio broadcast assistant Strain:   . Difficulty of Paying Living Expenses:   Food Insecurity:   . Worried About Charity fundraiser in the Last Year:   . Arboriculturist in the Last Year:   Transportation Needs:   . Film/video editor (Medical):   Marland Kitchen  Lack of Transportation (Non-Medical):   Physical Activity:   . Days of Exercise per Week:   . Minutes of Exercise per Session:   Stress:   . Feeling of Stress :   Social Connections:   . Frequency of Communication with Friends and Family:   . Frequency of Social Gatherings with Friends and Family:   . Attends Religious Services:   . Active Member of Clubs or Organizations:   . Attends Archivist Meetings:   Marland Kitchen Marital Status:   Intimate Partner Violence:   . Fear of Current or Ex-Partner:   . Emotionally Abused:   Marland Kitchen Physically Abused:   . Sexually Abused:     Current Outpatient Medications on File Prior to Visit  Medication Sig Dispense Refill  . JARDIANCE 25 MG TABS tablet TAKE 1 TABLET BY MOUTH ONCE DAILY. 30 tablet 0  . Accu-Chek FastClix Lancets MISC 1 each by Does not apply route 2 (two) times daily. E11.9 102 each 2  .  albuterol (PROAIR HFA) 108 (90 Base) MCG/ACT inhaler Inhale 2 puffs into the lungs every 6 (six) hours as needed for wheezing or shortness of breath. 1 Inhaler 4  . albuterol (PROVENTIL) (2.5 MG/3ML) 0.083% nebulizer solution Take 2.5 mg by nebulization every 6 (six) hours as needed for wheezing or shortness of breath.    . Blood Glucose Monitoring Suppl (ACCU-CHEK AVIVA PLUS) w/Device KIT 1 each by Does not apply route 2 (two) times daily. E11.9 1 kit 0  . budesonide (PULMICORT) 0.25 MG/2ML nebulizer solution Take 0.25 mg by nebulization 2 (two) times daily as needed.    . budesonide-formoterol (SYMBICORT) 160-4.5 MCG/ACT inhaler Inhale 2 puffs into the lungs 2 (two) times daily. 10.2 g 5  . busPIRone (BUSPAR) 7.5 MG tablet Take 7.5 mg by mouth 2 (two) times daily as needed.    . Butalbital-APAP-Caffeine 50-300-40 MG CAPS Take 1 capsule by mouth every 4 (four) hours as needed.    . citalopram (CELEXA) 40 MG tablet Take 40 mg by mouth daily.    . diazepam (VALIUM) 5 MG tablet   0  . Exenatide ER (BYDUREON BCISE) 2 MG/0.85ML AUIJ Inject 2 mg into the skin once a week. 4 pen 11  . gabapentin (NEURONTIN) 300 MG capsule Take 600 mg by mouth 3 (three) times daily. Reported on 01/25/2016    . GLOBAL EASE INJECT PEN NEEDLES 31G X 8 MM MISC USE UP TO 4 TIMES A DAY AS DIRECTED. 100 each PRN  . glucose blood (ACCU-CHEK AVIVA PLUS) test strip 1 each by Other route 2 (two) times daily. E11.9 100 strip 2  . HYDROcodone-acetaminophen (NORCO) 10-325 MG tablet   0  . Insulin Glargine (LANTUS SOLOSTAR) 100 UNIT/ML Solostar Pen Inject 190 Units into the skin every morning. And pen needles 1/day 25 pen 11  . metFORMIN (GLUCOPHAGE) 500 MG tablet Take 2 tablets (1,000 mg total) by mouth 2 (two) times daily with a meal. 120 tablet 2  . metoprolol succinate (TOPROL-XL) 25 MG 24 hr tablet Take 25 mg by mouth daily.    . montelukast (SINGULAIR) 10 MG tablet Take 1 tablet (10 mg total) by mouth at bedtime. 30 tablet 11  .  naproxen (NAPROSYN) 500 MG tablet Take 1 tablet (500 mg total) by mouth 2 (two) times daily. 30 tablet 0  . OXYGEN 2lpm with sleep and occ with exertion [name]    . sildenafil (VIAGRA) 100 MG tablet Take 0.5-1 tablets (50-100 mg total) by mouth daily as  needed for erectile dysfunction. 10 tablet 11  . SPIRIVA RESPIMAT 2.5 MCG/ACT AERS INHALE 2 PUFFS ONCE DAILY. 4 g 11  . topiramate (TOPAMAX) 25 MG tablet 50 mg 2 (two) times daily.   0   No current facility-administered medications on file prior to visit.    Allergies  Allergen Reactions  . Penicillins     Family History  Problem Relation Age of Onset  . Breast cancer Mother   . Melanoma Father   . Diabetes Sister     There were no vitals taken for this visit.   Review of Systems Denies LOC.  He has lost more weight.  He has intermitt dysuria.     Objective:   Physical Exam      Assessment & Plan:  Insulin-requiring type 2 DM, with PN: uncertain glycemic control.  I told pt he can take both Byetta and Bydureon Dysuria, new, poss due to yeast urethritis AB: steroid rx could still affect a1c  Patient Instructions  Please ask that a copy of the A1c result be sent here. Based on that result, we'll decide on a consistent number of units of the Lantus. check your blood sugar twice a day.  vary the time of day when you check, between before the 3 meals, and at bedtime.  also check if you have symptoms of your blood sugar being too high or too low.  please keep a record of the readings and bring it to your next appointment here (or you can bring the meter itself).  You can write it on any piece of paper.  please call us sooner if your blood sugar goes below 70, or if you have a lot of readings over 200. I have sent a prescription to your pharmacy, for a pill against a yeast infection, to see if that helps the urine symptoms.   Please come back for a follow-up appointment in 2 months.

## 2019-09-21 NOTE — Patient Instructions (Addendum)
Please ask that a copy of the A1c result be sent here. Based on that result, we'll decide on a consistent number of units of the Lantus. check your blood sugar twice a day.  vary the time of day when you check, between before the 3 meals, and at bedtime.  also check if you have symptoms of your blood sugar being too high or too low.  please keep a record of the readings and bring it to your next appointment here (or you can bring the meter itself).  You can write it on any piece of paper.  please call us sooner if your blood sugar goes below 70, or if you have a lot of readings over 200. I have sent a prescription to your pharmacy, for a pill against a yeast infection, to see if that helps the urine symptoms.   Please come back for a follow-up appointment in 2 months.

## 2019-09-30 ENCOUNTER — Other Ambulatory Visit: Payer: Self-pay | Admitting: Endocrinology

## 2019-12-13 ENCOUNTER — Other Ambulatory Visit: Payer: Self-pay | Admitting: Endocrinology

## 2019-12-13 DIAGNOSIS — IMO0002 Reserved for concepts with insufficient information to code with codable children: Secondary | ICD-10-CM

## 2019-12-15 ENCOUNTER — Telehealth: Payer: Self-pay | Admitting: Orthopedic Surgery

## 2019-12-15 NOTE — Telephone Encounter (Signed)
Patient called to inquire about records from our clinic from 2017. Relayed protocol of authorization form to be signed. We will provide records internally upon receiving signed release as mainly office visits. Patient aware.

## 2019-12-27 ENCOUNTER — Other Ambulatory Visit (HOSPITAL_COMMUNITY): Payer: Self-pay | Admitting: Neurology

## 2019-12-27 ENCOUNTER — Other Ambulatory Visit: Payer: Self-pay | Admitting: Neurology

## 2019-12-27 DIAGNOSIS — M5412 Radiculopathy, cervical region: Secondary | ICD-10-CM

## 2020-01-10 ENCOUNTER — Other Ambulatory Visit: Payer: Self-pay | Admitting: Endocrinology

## 2020-01-10 DIAGNOSIS — IMO0002 Reserved for concepts with insufficient information to code with codable children: Secondary | ICD-10-CM

## 2020-01-10 NOTE — Telephone Encounter (Signed)
1.  Please schedule f/u appt 2.  Then please refill x 2, pending that appt.

## 2020-01-17 ENCOUNTER — Other Ambulatory Visit: Payer: Self-pay | Admitting: Internal Medicine

## 2020-01-25 ENCOUNTER — Ambulatory Visit (HOSPITAL_COMMUNITY): Payer: Medicaid Other

## 2020-01-25 ENCOUNTER — Encounter (HOSPITAL_COMMUNITY): Payer: Self-pay

## 2020-02-15 ENCOUNTER — Ambulatory Visit: Payer: Medicaid Other | Admitting: Internal Medicine

## 2020-02-15 NOTE — Progress Notes (Deleted)
Subjective:    Patient ID: Matthew Dougherty, male    DOB: 04-09-1976     MRN: [id number]    Brief patient profile:  44  yowm active smoker/MM  from [place] good ex tol as teenager though had dx of asthma starting around age 46-14 and able to play to football/ cross country and had inhaler but didn't use it much until around 2010 need for saba increased and around 2014 placed on maint rx with advair but not better with criteria for severe copd in 04/2014 referred to pulmonary clinic 02/16/2016 by Dr Woody Seller      History of Present Illness  02/16/2016 1st [facility] office visit/ Matthew Dougherty  maint rx advair hfa  spiriva dpi / acei Chief Complaint  Patient presents with  . Pulmonary Consult    Referred by Dr. Woody Seller. Pt states dxed with COPD in 2015. He c/o increased cough and SOB over the past year. He states that some days he gets winded just walking around his house. His cough is prod with clear sputum and tends to be worse first thing in the am and then at night. He states that his symptoms can flare when exposed to certain smells and also with weather change.   placed on acei x 10 y and cough worse x around 2 years prior to OV  - no classic pleuritic or ex cp Doe = MMRC4  = sob if tries to leave home or while getting dressed   Some better with saba rec Plan A = Automatic = Advair 115 Take 2 puffs first thing in am and then another 2 puffs about 12 hours later.                                      Spiriva respimat 2 pffs each am  Plan B = Backup Only use your albuterol as a rescue medication to be used if you can't catch your breath by resting or doing a relaxed purse lip breathing pattern.  Stop lisinopril and start diovan (valsartan) 80 mg daily  The key is to stop smoking completely before smoking completely stops you - at least cut down if you can.       03/29/2016  f/u ov/Matthew Dougherty re: COPD III/ AB /still smoking on advair /spiriva respimat still actively smoking  Chief Complaint    Patient presents with  . Follow-up    PFT's done today. He still smokes 1 ppd. He states that he is interested in quitting. His breathing is unchanged since the last visit.   cough is better, amble to walk full wm = MMRC2 = can't walk a nl pace on a flat grade s sob but does fine slow and flat  Only using albuterol when over does it  rec Stop advair and symbicort 160 Take 2 puffs first thing in am and then another 2 puffs about 12 hours later.  Continue spiriva 2puff each am  Call me if problems acquiring spiriva  The key is to stop smoking completely before smoking completely stops you - it's not too late     01/10/2017  f/u ov/Matthew Dougherty re: Copd III / AB  / still actively smoking / ran out of symb/spiriva ? when Chief Complaint  Patient presents with  . Follow-up    Breathing has been worse "since the last time I had a lung infection"- march 2018. He continues to smoke.  mucus is clear now after stirs in am doesn't wake him Last albuterol  4.5h and much more use since ran out of maint rx He didn't realize he could get symb for free Doe = MMRC3 = can't walk 100 yards even at a slow pace at a flat grade s stopping due to sob  RA rec Plan A = Automatic = symbicort 160 / spiriva respimat Take 2 puffs first thing in am and then another 2 puffs of just the symbicort about 12 hours later.  Plan B = Backup Only use your albuterol  Plan C = Crisis - only use your albuterol nebulizer if you first try Plan B  The key is to stop smoking completely before smoking completely stops you!    11/10/2017  f/u ov/Matthew Dougherty re: copd III/ AB plus MO and still smoking on symb 160/ spiriva maint rx Chief Complaint  Patient presents with  . Follow-up    Breathing is unchanged since the last visit. He states he is having alot of sinus pressure and PND and he feels this is what is causing his problems. He has some prod cough with yellow sputum.    Dyspnea:  Has not tried to walk on 02 yet or checked sats with  activity on 02 / previously as low as 85% reported Cough: more x dec 2018 assoc with nasal congestion better on doxy in past  Sleep: with concentrator x 3 years  SABA use:  avg two x daily  But also on alb neb with pulmocort  rec Goal is to keep the saturations above 90% during the day with exertion  Plan A = Automatic = symbicort 160 x 2 followed by spiriva first thing in am and then 12 hours repeat the symbicort Work on inhaler technique:  Plan B = Backup Only use your albuterol as a rescue medication  Plan C = Crisis - only use your albuterol nebulizer if you first try Plan B and it fails to help > ok to use the nebulizer up to every 4 hours but if start needing it regularly call for immediate appointment Work on inhaler technique:   Doxycylcine 100 mg twice daily x 10 days take before eating The key is to stop smoking completely before smoking completely stops you!  Please schedule a follow up office visit in 6 weeks, call sooner if needed with all medications /inhalers/ solutions in hand so we can verify exactly what you are taking. This includes all medications from all doctors and over the counters     05/14/19 NP rec Spiriva HandiHaler- TWO puffs (1 capsule) daily  Continue Symbicort 160- 2 puffs twice a day  Start Singulair (RX sent to pharmacy) Goal is to keep the saturations above 90% during the day with exertion  STOP smoking!!! Speak with PCP about options to managing anxiety Also may want to consider counseling       08/31/2018  f/u ov/Matthew Dougherty re:  GOLD III copd/ 02 dep worse since Dec 2019 still smoking/ ? Taking spiriva /symb/ overusing neb saba / did not bring meds/ having trouble getting down here from [facility] for ov's Chief Complaint  Patient presents with  . Follow-up    Still smoking 1 ppd. He c/o increased wheezing and cough off and on since Dec 2019. He is coughing up yellow to green sputum. He is using his nebs about 2 x per day and uses the albuterol inhaler once  per wk on average.   Dyspnea:  MMRC3 =  can't walk 100 yards even at a slow pace at a flat grade s stopping due to sob   Cough: slt yellow thick worse in ams - clears as day goes on Sleeping: 2 pillows/ bed blat  SABA use: as above rec Work on inhaler technique:   The key is to stop smoking completely before smoking completely stops you! If not doing better we may need to change you over to the same combination entirely in a nebulizer = performist/yupelri/budesonide       02/15/2020  f/u ov/[place] office/Albirta Rhinehart re:  No chief complaint on file.    Dyspnea:  *** Cough: *** Sleeping: *** SABA use: *** 02: ***   No obvious day to day or daytime variability or assoc excess/ purulent sputum or mucus plugs or hemoptysis or cp or chest tightness, subjective wheeze or overt sinus or hb symptoms.   *** without nocturnal  or early am exacerbation  of respiratory  c/o's or need for noct saba. Also denies any obvious fluctuation of symptoms with weather or environmental changes or other aggravating or alleviating factors except as outlined above   No unusual exposure hx or h/o childhood pna/ asthma or knowledge of premature birth.  Current Allergies, Complete Past Medical History, Past Surgical History, Family History, and Social History were reviewed in [employer] record.  ROS  The following are not active complaints unless bolded Hoarseness, sore throat, dysphagia, dental problems, itching, sneezing,  nasal congestion or discharge of excess mucus or purulent secretions, ear ache,   fever, chills, sweats, unintended wt loss or wt gain, classically pleuritic or exertional cp,  orthopnea pnd or arm/hand swelling  or leg swelling, presyncope, palpitations, abdominal pain, anorexia, nausea, vomiting, diarrhea  or change in bowel habits or change in bladder habits, change in stools or change in urine, dysuria, hematuria,  rash, arthralgias, visual complaints, headache,  numbness, weakness or ataxia or problems with walking or coordination,  change in mood or  memory.        No outpatient medications have been marked as taking for the 02/15/20 encounter (Appointment) with Tanda Rockers, MD.                      Objective:   Physical Exam     02/15/2020        08/31/2018       210  11/10/2017         238   01/10/2017        244  06/28/2016     249  03/29/2016       239  02/16/16 226 lb (102.5 kg)  02/08/16 229 lb 6 oz (104 kg)  01/25/16 222 lb (100.7 kg)     Vital signs reviewed  02/15/2020  - Note at rest 02 sats  ***% on ***        Mild barrel  Contour                 Assessment & Plan:

## 2020-03-24 ENCOUNTER — Other Ambulatory Visit: Payer: Self-pay | Admitting: Internal Medicine

## 2020-04-28 ENCOUNTER — Ambulatory Visit (INDEPENDENT_AMBULATORY_CARE_PROVIDER_SITE_OTHER): Payer: Medicaid Other | Admitting: Internal Medicine

## 2020-04-28 ENCOUNTER — Other Ambulatory Visit: Payer: Self-pay

## 2020-04-28 ENCOUNTER — Encounter: Payer: Self-pay | Admitting: Internal Medicine

## 2020-04-28 VITALS — BP 120/70 | HR 102 | Temp 97.0°F | Ht 72.0 in | Wt 169.6 lb

## 2020-04-28 DIAGNOSIS — J449 Chronic obstructive pulmonary disease, unspecified: Secondary | ICD-10-CM | POA: Diagnosis not present

## 2020-04-28 DIAGNOSIS — Z23 Encounter for immunization: Secondary | ICD-10-CM | POA: Diagnosis not present

## 2020-04-28 DIAGNOSIS — F17200 Nicotine dependence, unspecified, uncomplicated: Secondary | ICD-10-CM

## 2020-04-28 DIAGNOSIS — J9611 Chronic respiratory failure with hypoxia: Secondary | ICD-10-CM | POA: Diagnosis not present

## 2020-04-28 MED ORDER — BREZTRI AEROSPHERE 160-9-4.8 MCG/ACT IN AERO: INHALATION_SPRAY | RESPIRATORY_TRACT | 11 refills | Status: DC

## 2020-04-28 MED ORDER — BREZTRI AEROSPHERE 160-9-4.8 MCG/ACT IN AERO
2.0000 | INHALATION_SPRAY | Freq: Two times a day (BID) | RESPIRATORY_TRACT | 0 refills | Status: DC
Start: 2020-04-28 — End: 2020-04-28

## 2020-04-28 NOTE — Assessment & Plan Note (Signed)
Placed on 02 x around  2016 by ENT doctor based on sleep study and reported to pulmonary 01/10/2017   - 11/10/2017  Walked RA x 3 laps @ 185 ft each stopped due to  desats to 88% resolved on 2lpm  - 04/28/2020 declined walking sats / says portable equip broker > referred back to dme to solve the issue and monitor sats with ex   For now 2lpm and advised:  Make sure you check your oxygen saturations at highest level of activity to be sure it stays over 90% and keep track of it at least once a week, more often if breathing getting worse, and let me know if losing ground.

## 2020-04-28 NOTE — Assessment & Plan Note (Signed)
Active smoker PFTs 05/06/14   FEV1  1.12 (25%) ratio 65 with 23% improvement p saba in SVC and dlco 62% corrects to 102%  FEV1/SVC = 46%   - 02/16/2016 changed spiriva to respimat/ d/c'd acei  - alpha one screen 02/16/2016 >  MM - PFT's  03/29/2016  FEV1 2.07 (48 % ) ratio 63  p 39 % improvement from saba p nothing prior to study with DLCO  66 % corrects to 102  % for alv volume   - 03/29/2016   try symb 160 2bid instead of advair - 05/13/2018 Added Singulair. Spiriva changed back to handihaler d/t insurance coverage.   - 04/28/2020  After extensive coaching inhaler device,  effectiveness =    75% (short Ti)  Try breztri samples x 6 weeks     Group D in terms of symptom/risk and laba/lama/ICS  therefore appropriate rx at this point >>>  Try breztri and prn saba  I spent extra time with pt today reviewing appropriate use of albuterol for prn use on exertion with the following points: 1) saba is for relief of sob that does not improve by walking a slower pace or resting but rather if the pt does not improve after trying this first. 2) If the pt is convinced, as many are, that saba helps recover from activity faster then it's easy to tell if this is the case by re-challenging : ie stop, take the inhaler, then p 5 minutes try the exact same activity (intensity of workload) that just caused the symptoms and see if they are substantially diminished or not after saba 3) if there is an activity that reproducibly causes the symptoms, try the saba 15 min before the activity on alternate days   If in fact the saba really does help, then fine to continue to use it prn but advised may need to look closer at the maintenance regimen being used to achieve better control of airways disease with exertion.   >>> return 6 weeks with all meds in hand using a trust but verify approach to confirm accurate Medication  Reconciliation The principal here is that until we are certain that the  patients are doing what we've asked,  it makes no sense to ask them to do more.   >>> pfts on return plus cxr if hasn't had one in meantime

## 2020-04-28 NOTE — Assessment & Plan Note (Signed)

## 2020-04-28 NOTE — Patient Instructions (Addendum)
Plan A = Automatic = Always=    Breztri (takes place of symbicort and spiriva) Take 2 puffs first thing in am and then another 2 puffs about 12 hours later.   Work on inhaler technique:  relax and gently blow all the way out then take a nice smooth deep breath back in, triggering the inhaler at same time you start breathing in.  Hold for up to 5 seconds if you can. Blow out thru nose. Rinse and gargle with water when done    Plan B = Backup (to supplement plan A, not to replace it) Only use your albuterol inhaler as a rescue medication to be used if you can't catch your breath by resting or doing a relaxed purse lip breathing pattern.  - The less you use it, the better it will work when you need it. - Ok to use the inhaler up to 2 puffs  every 4 hours if you must but call for appointment if use goes up over your usual need - Don't leave home without it !!  (think of it like the spare tire for your car)   Plan C = Crisis (instead of Plan B but only if Plan B stops working) - only use your albuterol nebulizer if you first try Plan B and it fails to help > ok to use the nebulizer up to every 4 hours but if start needing it regularly call for immediate appointment  The key is to stop smoking completely before smoking completely stops you!   Please remember to go to the  x-ray department at [facility]   for your tests - we will call you with the results when they are available.     Please schedule a follow up office visit in 6 weeks, call sooner if needed with all medications /inhalers/ solutions in hand so we can verify exactly what you are taking. This includes all medications from all doctors and over the counters  >>> PFTs on return

## 2020-04-28 NOTE — Progress Notes (Signed)
Subjective:    Patient ID: Matthew Dougherty, male    DOB: 1975/11/01,    MRN: [id number]    Brief patient profile:  44  yowm active smoker/MM  from [place] good ex tol as teenager though had dx of asthma starting around age 46-14 and able to play to football/ cross country and had inhaler but didn't use it much until around 2010 need for saba increased and around 2014 placed on maint rx with advair but not better with criteria for severe copd in 04/2014 referred to pulmonary clinic 02/16/2016 by Dr Matthew Dougherty      History of Present Illness  02/16/2016 1st [facility] office visit/ Matthew Dougherty  maint rx advair hfa  spiriva dpi / acei Chief Complaint  Patient presents with  . Pulmonary Consult    Referred by Dr. Woody Dougherty. Pt states dxed with COPD in 2015. He c/o increased cough and SOB over the past year. He states that some days he gets winded just walking around his house. His cough is prod with clear sputum and tends to be worse first thing in the am and then at night. He states that his symptoms can flare when exposed to certain smells and also with weather change.   placed on acei x 10 y and cough worse x around 2 years prior to OV  - no classic pleuritic or ex cp Doe = MMRC4  = sob if tries to leave home or while getting dressed   Some better with saba rec Plan A = Automatic = Advair 115 Take 2 puffs first thing in am and then another 2 puffs about 12 hours later.                                      Spiriva respimat 2 pffs each am  Plan B = Backup Only use your albuterol as a rescue medication to be used if you can't catch your breath by resting or doing a relaxed purse lip breathing pattern.  Stop lisinopril and start diovan (valsartan) 80 mg daily  The key is to stop smoking completely before smoking completely stops you - at least cut down if you can.       03/29/2016  f/u ov/Matthew Dougherty re: COPD III/ AB /still smoking on advair /spiriva respimat still actively smoking  Chief Complaint    Patient presents with  . Follow-up    PFT's done today. He still smokes 1 ppd. He states that he is interested in quitting. His breathing is unchanged since the last visit.   cough is better, amble to walk full wm = MMRC2 = can't walk a nl pace on a flat grade s sob but does fine slow and flat  Only using albuterol when over does it  rec Stop advair and symbicort 160 Take 2 puffs first thing in am and then another 2 puffs about 12 hours later.  Continue spiriva 2puff each am  Call me if problems acquiring spiriva  The key is to stop smoking completely before smoking completely stops you - it's not too late     01/10/2017  f/u ov/Matthew Dougherty re: Copd III / AB  / still actively smoking / ran out of symb/spiriva ? when Chief Complaint  Patient presents with  . Follow-up    Breathing has been worse "since the last time I had a lung infection"- march 2018. He continues to smoke.  mucus is clear now after stirs in am doesn't wake him Last albuterol  4.5h and much more use since ran out of maint rx He didn't realize he could get symb for free Doe = MMRC3 = can't walk 100 yards even at a slow pace at a flat grade s stopping due to sob  RA rec Plan A = Automatic = symbicort 160 / spiriva respimat Take 2 puffs first thing in am and then another 2 puffs of just the symbicort about 12 hours later.  Plan B = Backup Only use your albuterol  Plan C = Crisis - only use your albuterol nebulizer if you first try Plan B  The key is to stop smoking completely before smoking completely stops you!    11/10/2017  f/u ov/Matthew Dougherty re: copd III/ AB plus MO and still smoking on symb 160/ spiriva maint rx Chief Complaint  Patient presents with  . Follow-up    Breathing is unchanged since the last visit. He states he is having alot of sinus pressure and PND and he feels this is what is causing his problems. He has some prod cough with yellow sputum.    Dyspnea:  Has not tried to walk on 02 yet or checked sats with  activity on 02 / previously as low as 85% reported Cough: more x dec 2018 assoc with nasal congestion better on doxy in past  Sleep: with concentrator x 3 years  SABA use:  avg two x daily  But also on alb neb with pulmocort  rec Goal is to keep the saturations above 90% during the day with exertion  Plan A = Automatic = symbicort 160 x 2 followed by spiriva first thing in am and then 12 hours repeat the symbicort Work on inhaler technique:  Plan B = Backup Only use your albuterol as a rescue medication  Plan C = Crisis - only use your albuterol nebulizer if you first try Plan B and it fails to help > ok to use the nebulizer up to every 4 hours but if start needing it regularly call for immediate appointment Work on inhaler technique:   Doxycylcine 100 mg twice daily x 10 days take before eating The key is to stop smoking completely before smoking completely stops you!  Please schedule a follow up office visit in 6 weeks, call sooner if needed with all medications /inhalers/ solutions in hand so we can verify exactly what you are taking. This includes all medications from all doctors and over the counters     05/14/19 NP rec Spiriva HandiHaler- TWO puffs (1 capsule) daily  Continue Symbicort 160- 2 puffs twice a day  Start Singulair (RX sent to pharmacy) Goal is to keep the saturations above 90% during the day with exertion  STOP smoking!!! Speak with PCP about options to managing anxiety Also may want to consider counseling       08/31/2018  f/u ov/Matthew Dougherty re:  GOLD III copd/ 02 dep worse since Dec 2019 still smoking/ ? Taking spiriva /symb/ overusing neb saba / did not bring meds/ having trouble getting down from [facility] for ov's Chief Complaint  Patient presents with  . Follow-up    Still smoking 1 ppd. He c/o increased wheezing and cough off and on since Dec 2019. He is coughing up yellow to green sputum. He is using his nebs about 2 x per day and uses the albuterol inhaler once per  wk on average.   Dyspnea:  MMRC3 = can't  walk 100 yards even at a slow pace at a flat grade s stopping due to sob   Cough: slt yellow thick worse in ams - clears as day goes on Sleeping: 2 pillows/ bed blat  SABA use: as above rec Work on inhaler technique:   The key is to stop smoking completely before smoking completely stops you! -  For smoking cessation counseling/ coordination  call [phone]  If not doing better we may need to change you over to the same combination entirely in a nebulizer = performist/yupelri/budesonide    04/28/2020  f/u ov/[facility]/Jami Ohlin re: copd  GOLD 3 / still smoking maint on symb/spriva dpi plus finishing doxy/pred from recent flare s cxr yet Chief Complaint  Patient presents with  . Follow-up    chest congestion breaking up (on prednisone/abx)  Dyspnea:  foodlion x 2 aisles s 02 / does not check sats Cough: about usual level am congestion, x 30 min  Sleeping: bed is flat 10 degrees on pillows  SABA use: proair avg once few days, just with flares  02: 2lpm bedtime, no portable x one year and not interested in requalifying    No obvious day to day or daytime variability or assoc excess/ purulent sputum or mucus plugs or hemoptysis or cp or chest tightness, subjective wheeze or overt sinus or hb symptoms.   Sleeping  without nocturnal  or early am exacerbation  of respiratory  c/o's or need for noct saba. Also denies any obvious fluctuation of symptoms with weather or environmental changes or other aggravating or alleviating factors except as outlined above   No unusual exposure hx or h/o childhood pna/ asthma or knowledge of premature birth.  Current Allergies, Complete Past Medical History, Past Surgical History, Family History, and Social History were reviewed in [employer] record.  ROS  The following are not active complaints unless bolded Hoarseness, sore throat, dysphagia, dental problems, itching, sneezing,  nasal  congestion or discharge of excess mucus or purulent secretions, ear ache,   fever, chills, sweats, unintended wt loss or wt gain, classically pleuritic or exertional cp,  orthopnea pnd or arm/hand swelling  or leg swelling, presyncope, palpitations, abdominal pain, anorexia, nausea, vomiting, diarrhea  or change in bowel habits or change in bladder habits, change in stools or change in urine, dysuria, hematuria,  rash, arthralgias, visual complaints, headache, numbness, weakness or ataxia or problems with walking or coordination,  change in mood or  memory.        Current Meds  Medication Sig  . Accu-Chek FastClix Lancets MISC 1 each by Does not apply route 2 (two) times daily. E11.9  . albuterol (PROAIR HFA) 108 (90 Base) MCG/ACT inhaler Inhale 2 puffs into the lungs every 6 (six) hours as needed for wheezing or shortness of breath.  Marland Kitchen albuterol (PROVENTIL) (2.5 MG/3ML) 0.083% nebulizer solution Take 2.5 mg by nebulization every 6 (six) hours as needed for wheezing or shortness of breath.  . Blood Glucose Monitoring Suppl (ACCU-CHEK AVIVA PLUS) w/Device KIT 1 each by Does not apply route 2 (two) times daily. E11.9  . budesonide (PULMICORT) 0.25 MG/2ML nebulizer solution Take 0.25 mg by nebulization 2 (two) times daily as needed.  . budesonide-formoterol (SYMBICORT) 160-4.5 MCG/ACT inhaler Inhale 2 puffs into the lungs 2 (two) times daily.  . busPIRone (BUSPAR) 7.5 MG tablet Take 7.5 mg by mouth 2 (two) times daily as needed.  . Butalbital-APAP-Caffeine 50-300-40 MG CAPS Take 1 capsule by mouth every 4 (four) hours as  needed.  . citalopram (CELEXA) 40 MG tablet Take 40 mg by mouth daily.  . diazepam (VALIUM) 5 MG tablet   . doxycycline (VIBRA-TABS) 100 MG tablet Take 100 mg by mouth 2 (two) times daily.  Eduard Roux (AIMOVIG) 140 MG/ML SOAJ Aimovig Autoinjector 140 mg/mL subcutaneous auto-injector  Inject 1 mL every month by subcutaneous route.  Marland Kitchen exenatide (BYETTA 10 MCG PEN) 10 MCG/0.04ML  SOPN injection Inject 0.04 mLs (10 mcg total) into the skin 2 (two) times daily with a meal.  . Exenatide ER (BYDUREON BCISE) 2 MG/0.85ML AUIJ Inject 2 mg into the skin once a week.  . fluconazole (DIFLUCAN) 150 MG tablet Take 1 tablet (150 mg total) by mouth daily.  Marland Kitchen gabapentin (NEURONTIN) 300 MG capsule Take 600 mg by mouth 3 (three) times daily. Reported on 01/25/2016  . GLOBAL EASE INJECT PEN NEEDLES 31G X 8 MM MISC USE UP TO 4 TIMES A DAY AS DIRECTED.  Marland Kitchen glucose blood (ACCU-CHEK AVIVA PLUS) test strip 1 each by Other route 2 (two) times daily. E11.9  . HYDROcodone-acetaminophen (NORCO) 10-325 MG tablet   . Insulin Glargine (LANTUS SOLOSTAR) 100 UNIT/ML Solostar Pen Inject 190 Units into the skin every morning. And pen needles 1/day  . JARDIANCE 25 MG TABS tablet TAKE 1 TABLET BY MOUTH ONCE DAILY.  . metFORMIN (GLUCOPHAGE) 500 MG tablet TAKE 2 TABLETS TWICE DAILY WITH MEALS.  . metoprolol succinate (TOPROL-XL) 25 MG 24 hr tablet Take 25 mg by mouth daily.  . montelukast (SINGULAIR) 10 MG tablet Take 1 tablet (10 mg total) by mouth at bedtime.  . naproxen (NAPROSYN) 500 MG tablet Take 1 tablet (500 mg total) by mouth 2 (two) times daily.  . OXYGEN 2lpm with sleep and occ with exertion [name]  . predniSONE (DELTASONE) 10 MG tablet Take 10 mg by mouth 2 (two) times daily.  . sildenafil (VIAGRA) 100 MG tablet Take 0.5-1 tablets (50-100 mg total) by mouth daily as needed for erectile dysfunction.  Marland Kitchen SPIRIVA HANDIHALER 18 MCG inhalation capsule BREATH IN THE CONTENTS OF 1 CAPSULE ONCE DAILY VIA THE HANDIHALER DEVICE  . SPIRIVA RESPIMAT 2.5 MCG/ACT AERS INHALE 2 PUFFS ONCE DAILY.  Marland Kitchen vortioxetine HBr (TRINTELLIX) 20 MG TABS tablet Take 20 mg by mouth daily.                      Objective:   Physical Exam     04/28/2020    169 08/31/2018       210  11/10/2017         238   01/10/2017        244  06/28/2016     249  03/29/2016       239  02/16/16 226 lb (102.5 kg)  02/08/16 229 lb 6 oz  (104 kg)  01/25/16 222 lb (100.7 kg)    amb chronically ill appearing  wm nad    Vital signs reviewed  04/28/2020  - Note at rest 02 sats  97% on RA        HEENT : pt wearing mask not removed for exam due to covid - 19 concerns.    NECK :  without JVD/Nodes/TM/ nl carotid upstrokes bilaterally   LUNGS: no acc muscle use,  Mild barrel  contour chest wall with bilateral  Isnp/ exp sonorous rhonchi and  without cough on insp or exp maneuvers  and mild  Hyperresonant  to  percussion bilaterally     CV:  RRR  no s3 or murmur or increase in P2, and no edema   ABD:  soft and nontender with pos end  insp Hoover's  in the supine position. No bruits or organomegaly appreciated, bowel sounds nl  MS:   Nl gait/  ext warm without deformities, calf tenderness, cyanosis or clubbing No obvious joint restrictions   SKIN: warm and dry without lesions    NEURO:  alert, approp, nl sensorium with  no motor or cerebellar deficits apparent.          CXR PA and Lateral:   04/28/2020 :    I personally reviewed images and agree with radiology impression as follows:    Did not go for cxr as rec        Assessment & Plan:

## 2020-05-01 ENCOUNTER — Other Ambulatory Visit: Payer: Self-pay

## 2020-05-01 ENCOUNTER — Ambulatory Visit (HOSPITAL_COMMUNITY)
Admission: RE | Admit: 2020-05-01 | Discharge: 2020-05-01 | Disposition: A | Discharge status: Home / Self Care | Payer: Medicaid Other | Source: Ambulatory Visit | Attending: Internal Medicine | Admitting: Internal Medicine

## 2020-05-01 DIAGNOSIS — J449 Chronic obstructive pulmonary disease, unspecified: Secondary | ICD-10-CM | POA: Insufficient documentation

## 2020-06-07 ENCOUNTER — Other Ambulatory Visit: Payer: Self-pay | Admitting: Endocrinology

## 2020-06-07 NOTE — Telephone Encounter (Signed)
Okay to refill, thanks!

## 2020-06-07 NOTE — Telephone Encounter (Signed)
1.  Please schedule f/u appt 2.  Then please refill x 2 mos, pending that appt.  

## 2020-07-11 ENCOUNTER — Other Ambulatory Visit: Payer: Self-pay | Admitting: Endocrinology

## 2021-06-12 ENCOUNTER — Telehealth: Payer: Self-pay

## 2021-06-12 ENCOUNTER — Other Ambulatory Visit: Payer: Self-pay

## 2021-06-12 MED ORDER — PREDNISONE 10 MG PO TABS: ORAL_TABLET | ORAL | 0 refills | Status: AC

## 2021-06-12 MED ORDER — AZITHROMYCIN 250 MG PO TABS: ORAL_TABLET | ORAL | 0 refills | Status: AC

## 2021-06-12 NOTE — Telephone Encounter (Signed)
Video visit is fine

## 2021-06-12 NOTE — Telephone Encounter (Signed)
Fine to do PA and if we have a sample give that to tide him over and if not let him know symbicort is probably covered and has 2 of the 3 ingredients in breztri at dose of 160 2 q 12h   In meantime: Zpak Prednisone 10 mg take  4 each am x 2 days,   2 each am x 2 days,  1 each am x 2 days and stop

## 2021-06-12 NOTE — Telephone Encounter (Signed)
Primary Pulmonologist: Dr. Melvyn Novas  Last office visit and with whom: Dr. Melvyn Novas 04-27-2020 What do we see them for (pulmonary problems): COPD Gold III Last OV assessment/plan: See Below   Was appointment offered to patient (explain)?    Patient is requesting a video visit with Dr. Melvyn Novas. States he has issues with transportation currently and cannot physically come to the office.    Reason for call: Patient called stating he has some congestion, cough with yellow and brown mucus, wheezing. No fever. No OTC meds. Has taken a covid test and was negative. 93% sats on room air. Patient would like to know if he can have prednisone and an antibiotic called into [facility] in [place].   Also wants to make Dr. Melvyn Novas aware that his breztri inhaler was helping him but he stopped using due to insurance. States his insurance told him they would cover it if we did a PA on breztri and wants to know if it can be reordered.    Allergies  Allergen Reactions   Penicillins     Immunization History  Administered Date(s) Administered   Influenza,inj,Quad PF,6+ Mos 03/29/2016, 05/13/2018, 04/28/2020   Influenza,inj,quad, With Preservative 05/02/2017   Pneumococcal Conjugate-13 05/13/2018   Pneumococcal Polysaccharide-23 03/17/2012   Pneumococcal-Unspecified 07/08/2012   Tdap 02/16/2016    Assessment & Plan:         Assessment & Plan Note by Tanda Rockers, MD at 04/28/2020 2:51 PM  Author: Tanda Rockers, MD Author Type: Physician Filed: 04/28/2020  2:52 PM  Note Status: Written Cosign: Cosign Not Required Encounter Date: 04/28/2020  Problem: Current smoker  Editor: Tanda Rockers, MD (Physician)             Counseled re importance of smoking cessation but did not meet time criteria for separate billing             Each maintenance medication was reviewed in detail including emphasizing most importantly the difference between maintenance and prns and under what circumstances the prns are to be  triggered using an action plan format where appropriate.   Total time for H and P, chart review, counseling, teaching device and generating customized AVS unique to this office visit / charting = 40 min         Assessment & Plan Note by Tanda Rockers, MD at 04/28/2020 2:51 PM  Author: Tanda Rockers, MD Author Type: Physician Filed: 04/28/2020  2:51 PM  Note Status: Written Cosign: Cosign Not Required Encounter Date: 04/28/2020  Problem: Chronic respiratory failure with hypoxia (HCC)/ nocturnal 02 only   Editor: Tanda Rockers, MD (Physician)             Placed on 02 x around  2016 by ENT doctor based on sleep study and reported to pulmonary 01/10/2017   - 11/10/2017  Walked RA x 3 laps @ 185 ft each stopped due to  desats to 88% resolved on 2lpm  - 04/28/2020 declined walking sats / says portable equip broker > referred back to dme to solve the issue and monitor sats with ex    For now 2lpm and advised:  Make sure you check your oxygen saturations at highest level of activity to be sure it stays over 90% and keep track of it at least once a week, more often if breathing getting worse, and let me know if losing ground.         Assessment & Plan Note by Tanda Rockers, MD at 04/28/2020  2:49 PM  Author: Tanda Rockers, MD Author Type: Physician Filed: 04/28/2020  2:50 PM  Note Status: Written Cosign: Cosign Not Required Encounter Date: 04/28/2020  Problem: COPD GOLD III/ still smoking with restrictive component   Editor: Tanda Rockers, MD (Physician)             Active smoker PFTs 05/06/14   FEV1  1.12 (25%) ratio 65 with 23% improvement p saba in SVC and dlco 62% corrects to 102%  FEV1/SVC = 46%   - 02/16/2016 changed spiriva to respimat/ d/c'd acei  - alpha one screen 02/16/2016 >  MM - PFT's  03/29/2016  FEV1 2.07 (48 % ) ratio 63  p 39 % improvement from saba p nothing prior to study with DLCO  66 % corrects to 102  % for alv volume   - 03/29/2016   try symb 160 2bid instead of  advair - 05/13/2018 Added Singulair. Spiriva changed back to handihaler d/t insurance coverage.   - 04/28/2020  After extensive coaching inhaler device,  effectiveness =    75% (short Ti)  Try breztri samples x 6 weeks      Group D in terms of symptom/risk and laba/lama/ICS  therefore appropriate rx at this point >>>  Try breztri and prn saba   I spent extra time with pt today reviewing appropriate use of albuterol for prn use on exertion with the following points: 1) saba is for relief of sob that does not improve by walking a slower pace or resting but rather if the pt does not improve after trying this first. 2) If the pt is convinced, as many are, that saba helps recover from activity faster then it's easy to tell if this is the case by re-challenging : ie stop, take the inhaler, then p 5 minutes try the exact same activity (intensity of workload) that just caused the symptoms and see if they are substantially diminished or not after saba 3) if there is an activity that reproducibly causes the symptoms, try the saba 15 min before the activity on alternate days    If in fact the saba really does help, then fine to continue to use it prn but advised may need to look closer at the maintenance regimen being used to achieve better control of airways disease with exertion.    >>> return 6 weeks with all meds in hand using a trust but verify approach to confirm accurate Medication  Reconciliation The principal here is that until we are certain that the  patients are doing what we've asked, it makes no sense to ask them to do more.    >>> pfts on return plus cxr if hasn't had one in meantime

## 2021-06-12 NOTE — Telephone Encounter (Signed)
Will notify patient through mychart as requested.   Dr. Melvyn Novas please advise if it is okay to schedule the pt with a Video Mychart visit. Patient is having transportation issues and states he cannot come to office for an appt.

## 2021-06-12 NOTE — Telephone Encounter (Signed)
Sent a mychart message to patient regarding response from Dr. Melvyn Novas as requested by patient. Orders sent in to [facility] in [place]. Nothing further needed at this time.

## 2021-07-20 ENCOUNTER — Telehealth: Payer: Self-pay | Admitting: Internal Medicine

## 2021-07-20 MED ORDER — PREDNISONE 10 MG PO TABS: ORAL_TABLET | ORAL | 0 refills | Status: DC

## 2021-07-20 NOTE — Telephone Encounter (Signed)
Spoke with the pt and notified of response per [facility]  He verbalized understanding  I advised that we need to get him scheduled for in person ov since not seen since 2020  He understands this, but states he will have to call back to schedule an appt  Nothing further needed
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
## 2021-07-20 NOTE — Telephone Encounter (Signed)
Yes would recommend he take prednisone taper (I sent Rx). Hx COPD. Continue Breztri two puffs twice daily. Use albuterol inhaler or nebulizer every 4-6 hours for shortness of breath/wheezing. If symptoms do not improve or worsen should present to ED or UC for evaluation with CXR/ Monitor O2 readings goal >90%

## 2021-08-20 ENCOUNTER — Telehealth: Payer: Self-pay | Admitting: Internal Medicine

## 2021-08-20 MED ORDER — DOXYCYCLINE HYCLATE 100 MG PO TABS: 100.0000 mg | ORAL_TABLET | Freq: Two times a day (BID) | ORAL | 0 refills | Status: DC

## 2021-08-20 MED ORDER — PREDNISONE 10 MG PO TABS: ORAL_TABLET | ORAL | 0 refills | Status: DC

## 2021-08-20 NOTE — Telephone Encounter (Signed)
Patient is aware of recommendations and voiced his understanding.  Prednisone and doxy has been sent to preferred pharmacy.  Nothing further needed.

## 2021-08-20 NOTE — Telephone Encounter (Signed)
Prednisone 10 mg take  4 each am x 2 days,   2 each am x 2 days,  1 each am x 2 days and stop   Doxy 100 mg bid x 10 days  Be sure to keep f/u appt - call sooner if notbetter p rx

## 2021-08-20 NOTE — Telephone Encounter (Signed)
Primary Pulmonologist: Dr. Melvyn Novas  Last office visit and with whom: 04/27/2020 Dr. Melvyn Novas What do we see them for (pulmonary problems): COPD Last OV assessment/plan: see below   Was appointment offered to patient (explain)?  Patient has an appt on 09/26/2021. Refused appt this week due to transportation.    Reason for call: Chest congestion, coughing up yellow mucus, no fevers, wheezing. Covid and flu test negative.  Wants to know if patient can have prednisone and doxycycline or clindamycin.   Allergies  Allergen Reactions   Penicillins     Immunization History  Administered Date(s) Administered   Influenza,inj,Quad PF,6+ Mos 03/29/2016, 05/13/2018, 04/28/2020   Influenza,inj,quad, With Preservative 05/02/2017   Pneumococcal Conjugate-13 05/13/2018   Pneumococcal Polysaccharide-23 03/17/2012   Pneumococcal-Unspecified 07/08/2012   Tdap 02/16/2016    Assessment & Plan:         Assessment & Plan Note by Tanda Rockers, MD at 04/28/2020 2:51 PM  Author: Tanda Rockers, MD Author Type: Physician Filed: 04/28/2020  2:52 PM  Note Status: Written Cosign: Cosign Not Required Encounter Date: 04/28/2020  Problem: Current smoker  Editor: Tanda Rockers, MD (Physician)             Counseled re importance of smoking cessation but did not meet time criteria for separate billing             Each maintenance medication was reviewed in detail including emphasizing most importantly the difference between maintenance and prns and under what circumstances the prns are to be triggered using an action plan format where appropriate.   Total time for H and P, chart review, counseling, teaching device and generating customized AVS unique to this office visit / charting = 40 min         Assessment & Plan Note by Tanda Rockers, MD at 04/28/2020 2:51 PM  Author: Tanda Rockers, MD Author Type: Physician Filed: 04/28/2020  2:51 PM  Note Status: Written Cosign: Cosign Not Required Encounter  Date: 04/28/2020  Problem: Chronic respiratory failure with hypoxia (HCC)/ nocturnal 02 only   Editor: Tanda Rockers, MD (Physician)             Placed on 02 x around  2016 by ENT doctor based on sleep study and reported to pulmonary 01/10/2017   - 11/10/2017  Walked RA x 3 laps @ 185 ft each stopped due to  desats to 88% resolved on 2lpm  - 04/28/2020 declined walking sats / says portable equip broker > referred back to dme to solve the issue and monitor sats with ex    For now 2lpm and advised:  Make sure you check your oxygen saturations at highest level of activity to be sure it stays over 90% and keep track of it at least once a week, more often if breathing getting worse, and let me know if losing ground.         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 Spiriva changed back to handihaler d/t insurance coverage.   - 04/28/2020  After extensive coaching inhaler device,  effectiveness =    75% (short Ti)  Try breztri samples x 6 weeks      Group D in terms of symptom/risk and laba/lama/ICS  therefore appropriate rx at this point >>>  Try breztri and prn saba   I spent extra time with pt today reviewing appropriate use of albuterol for  prn use on exertion with the following points: 1) saba is for relief of sob that does not improve by walking a slower pace or resting but rather if the pt does not improve after trying this first. 2) If the pt is convinced, as many are, that saba helps recover from activity faster then it's easy to tell if this is the case by re-challenging : ie stop, take the inhaler, then p 5 minutes try the exact same activity (intensity of workload) that just caused the symptoms and see if they are substantially diminished or not after saba 3) if there is an activity that reproducibly causes the symptoms, try the saba 15 min before the activity on alternate days    If in fact the saba really does help, then fine to continue to use it prn but advised may need to look closer at the maintenance regimen being used to achieve better control of airways disease with exertion.    >>> return 6 weeks with all meds in hand using a trust but verify approach to confirm accurate Medication  Reconciliation The principal here is that until we are certain that the  patients are doing what we've asked, it makes no sense to ask them to do more.    >>> pfts on return plus cxr if hasn't had one in meantime

## 2021-09-26 ENCOUNTER — Other Ambulatory Visit: Payer: Self-pay

## 2021-09-26 ENCOUNTER — Encounter: Payer: Self-pay | Admitting: Internal Medicine

## 2021-09-26 ENCOUNTER — Ambulatory Visit: Payer: Medicaid Other | Admitting: Internal Medicine

## 2021-09-26 DIAGNOSIS — F17200 Nicotine dependence, unspecified, uncomplicated: Secondary | ICD-10-CM

## 2021-09-26 DIAGNOSIS — J449 Chronic obstructive pulmonary disease, unspecified: Secondary | ICD-10-CM

## 2021-09-26 DIAGNOSIS — F17210 Nicotine dependence, cigarettes, uncomplicated: Secondary | ICD-10-CM

## 2021-09-26 DIAGNOSIS — J9611 Chronic respiratory failure with hypoxia: Secondary | ICD-10-CM | POA: Diagnosis not present

## 2021-09-26 MED ORDER — SPIRIVA RESPIMAT 2.5 MCG/ACT IN AERS: 2.0000 | INHALATION_SPRAY | Freq: Every day | RESPIRATORY_TRACT | 11 refills | Status: AC

## 2021-09-26 MED ORDER — PREDNISONE 10 MG PO TABS: ORAL_TABLET | ORAL | 0 refills | Status: DC

## 2021-09-26 MED ORDER — BUDESONIDE-FORMOTEROL FUMARATE 160-4.5 MCG/ACT IN AERO: INHALATION_SPRAY | RESPIRATORY_TRACT | 12 refills | Status: AC

## 2021-09-26 MED ORDER — SPIRIVA RESPIMAT 2.5 MCG/ACT IN AERS: 2.0000 | INHALATION_SPRAY | Freq: Every day | RESPIRATORY_TRACT | 0 refills | Status: AC

## 2021-09-26 NOTE — Patient Instructions (Addendum)
Let me know if 02 saturations are dropping below 89% with activities you enjoy doing  ? ? ?Prednisone 10 mg take  4 each am x 2 days,   2 each am x 2 days,  1 each am x 2 days and stop  ? ?Breztri = symbicort/spiriva and Take 2 puffs first thing in am and then another 2 puffs about 12 hours later.  ? ?Work on inhaler technique:  relax and gently blow all the way out then take a nice smooth full deep breath back in, triggering the inhaler at same time you start breathing in.  Hold for up to 5 seconds if you can. Blow out thru nose. Rinse and gargle with water when done.  If mouth or throat bother you at all,  try brushing teeth/gums/tongue with arm and hammer toothpaste/ make a slurry and gargle and spit out.  ? ?    ? ?Please schedule a follow up visit in 6  months but call sooner if needed - bring your inhalers  ?
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
## 2021-09-28 ENCOUNTER — Telehealth: Payer: Self-pay | Admitting: Internal Medicine

## 2021-09-28 ENCOUNTER — Ambulatory Visit: Payer: Medicaid Other

## 2021-09-28 ENCOUNTER — Encounter: Payer: Self-pay | Admitting: Internal Medicine

## 2021-09-28 DIAGNOSIS — J449 Chronic obstructive pulmonary disease, unspecified: Secondary | ICD-10-CM

## 2021-09-28 DIAGNOSIS — J9611 Chronic respiratory failure with hypoxia: Secondary | ICD-10-CM

## 2021-09-28 NOTE — Telephone Encounter (Signed)
Matthew Rockers, MD ?  ?   6:28 AM ?Note ?Call pt and let him know p review of records I found he has not had cxr in 1.5 y and since breathing worse needs one at his convenience and we will call him with results   ?  ? ? ?Called and spoke with pt letting him know the info stated by Dr. Melvyn Novas and stated to him that he could go to [facility] to get the cxr done at his convenience. Pt verbalized understanding. Order has been placed. Nothing further needed. ?

## 2021-09-28 NOTE — Assessment & Plan Note (Addendum)
Placed on 02 x around  2016 by ENT doctor based on sleep study and reported to pulmonary 01/10/2017   ?- 11/10/2017  Walked RA x 3 laps @ 185 ft each stopped due to  desats to 88% resolved on 2lpm  ?- 04/28/2020 declined walking sats / says portable equip broken > referred back to dme to solve the issue and monitor sats with ex  ? ?Advised ?Make sure you check your oxygen saturation  AT  your highest level of activity (not after you stop)   to be sure it stays over 90% and adjust  02 flow upward to maintain this level if needed but remember to turn it back to previous settings when you stop (to conserve your supply).  ? ?    ?  ? ?Each maintenance medication was reviewed in detail including emphasizing most importantly the difference between maintenance and prns and under what circumstances the prns are to be triggered using an action plan format where appropriate. ? ?Total time for H and P, chart review, counseling, reviewing hfa/02 device(s) and generating customized AVS unique to this office visit / same day charting = 24 min  ?     ?

## 2021-09-28 NOTE — Telephone Encounter (Signed)
ATC patient.  LMTCB. 

## 2021-09-28 NOTE — Telephone Encounter (Signed)
Call pt and let him know p review of records I found he has not had cxr in 1.5 y and since breathing worse needs one at his convenience and we will call him with results  ?

## 2021-09-28 NOTE — Assessment & Plan Note (Addendum)
Active smoker ?PFTs 05/06/14   FEV1  1.12 (25%) ratio 65 with 23% improvement p saba in SVC and dlco 62% corrects to 102%  FEV1/SVC = 46%   ?- 02/16/2016 changed spiriva to respimat/ d/c'd acei  ?- alpha one screen 02/16/2016 >  MM ?- PFT's  03/29/2016  FEV1 2.07 (48 % ) ratio 63  p 39 % improvement from saba p nothing prior to study with DLCO  66 % corrects to 102  % for alv volume   ?- 03/29/2016   try symb 160 2bid instead of advair ?- 05/13/2018 Added Singulair. Spiriva changed back to handihaler d/t insurance coverage.   ?- 04/28/2020  After extensive coaching inhaler device,  effectiveness =    75% (short Ti)  Try breztri samples x 6 weeks   ? ? ?- The proper method of use, as well as anticipated side effects, of a metered-dose inhaler were discussed and demonstrated to the patient using teach back method. Improved effectiveness after extensive coaching during this visit to a level of approximately 75 % from a baseline of 50 % (short Ti)  ? ?Flared p smoke exp and still smoking himself s purulent sputum so rx = ?>>> Prednisone 10 mg take  4 each am x 2 days,   2 each am x 2 days,  1 each am x 2 days and stop  ?>>> Check cxr  ? >>> Group D in terms of symptom/risk and laba/lama/ICS  therefore appropriate rx at this point >>>  Continue spiriva /symb or breztri and approp saba  ? ? ?

## 2021-09-28 NOTE — Assessment & Plan Note (Signed)
4-5 min discussion re active cigarette smoking in addition to office E&M  Ask about tobacco use:   ongoing Advise quitting   I took an extended  opportunity with this patient to outline the consequences of continued cigarette use  in airway disorders based on all the data we have from the multiple national lung health studies (perfomed over decades at millions of dollars in cost)  indicating that smoking cessation, not choice of inhalers or physicians, is the most important aspect of care.   Assess willingness:  Not committed at this point Assist in quit attempt:  Per PCP when ready Arrange follow up:   Follow up per Primary Care planned        

## 2021-10-03 ENCOUNTER — Ambulatory Visit: Payer: Medicaid Other | Admitting: Endocrinology

## 2021-10-03 ENCOUNTER — Encounter: Payer: Self-pay | Admitting: Endocrinology

## 2021-10-03 VITALS — BP 106/70 | HR 96 | Ht 72.0 in | Wt 163.2 lb

## 2021-10-03 DIAGNOSIS — E119 Type 2 diabetes mellitus without complications: Secondary | ICD-10-CM

## 2021-10-03 LAB — POCT GLYCOSYLATED HEMOGLOBIN (HGB A1C): Hemoglobin A1C: 9 % — AB (ref 4.0–5.6)

## 2021-10-03 MED ORDER — DEXCOM G6 TRANSMITTER MISC: 1.0000 | Freq: Once | 1 refills | Status: AC

## 2021-10-03 MED ORDER — NOVOLOG FLEXPEN 100 UNIT/ML ~~LOC~~ SOPN
0.0000 [IU] | PEN_INJECTOR | Freq: Three times a day (TID) | SUBCUTANEOUS | 3 refills | Status: AC
Start: 2021-10-03 — End: ?

## 2021-10-03 MED ORDER — LANTUS SOLOSTAR 100 UNIT/ML ~~LOC~~ SOPN: 20.0000 [IU] | PEN_INJECTOR | SUBCUTANEOUS | 3 refills | Status: AC

## 2021-10-03 NOTE — Patient Instructions (Addendum)
I have sent 2 prescriptions to your pharmacy: for the Lantus, and a reduced amount of Novolog ?check your blood sugar twice a day.  vary the time of day when you check, between before the 3 meals, and at bedtime.  also check if you have symptoms of your blood sugar being too high or too low.  please keep a record of the readings and bring it to your next appointment here (or you can bring the meter itself).  You can write it on any piece of paper.  please call us sooner if your blood sugar goes below 70, or if you have a lot of readings over 200.     ?Please come back for a follow-up appointment in 3 months.   ?

## 2021-10-03 NOTE — Progress Notes (Signed)
? ?Subjective:  ? ? Patient ID: Matthew Dougherty, male    DOB: [birth year], 46 y.o.   MRN: [id number] ? ?HPI ?Pt returns for f/u of diabetes mellitus:  ?DM type: Insulin-requiring type 2.   ?Dx'ed: 2006 ?Complications: PN ?Therapy: insulin since 2009, Bydureon, Byetta, and 2 oral meds.   ?DKA: never ?Severe hypoglycemia: never.  ?Pancreatitis: never.  ?Pancreatic imaging: never.  ?Other: he did not tolerate novolog (cramps); he takes intermitt prednisone, for COPD; wife declines for him to resume invokana (proteinuria); he declines multiple daily injections; He takes both byetta and bydureon.  ?Interval history: last seen here 2021; he was rx'ed prednisone last week, for COPD.  He has not recently taken Lantus.  He takes only PRN Novolog (0-4 doses per day; averages a total of 0-40 units per day).  Pt says primary care provider had told him the pump rx would improve glycemic control.  I reviewed continuous glucose monitor data.  Glucose varies from 130-290.  It is in general highest at [place], and less high at 11PM.  It is lowest at 12 [place].  However, there is little trend throughout the day. ?Past Medical History:  ?Diagnosis Date  ? Arthritis   ? Asthma   ? Cancer [facility])   ? kidney  ? Chronic kidney disease   ? partial removal of left kidney; cancer  ? Chronic knee pain   ? COPD (chronic obstructive pulmonary disease) ([place])   ? Depression   ? Diabetes mellitus without complication ([place])   ? Gout   ? Hypertension   ? Neuropathy   ? Osteoarthritis   ? Renal cancer ([place])   ? 2010 left partial nephrectomy  ? Restless leg syndrome   ? Seizures ([place])   ? few years ago; states that he aspirated and had a seizure; no meds for seizures and none since then  ? ? ?Past Surgical History:  ?Procedure Laterality Date  ? KNEE SURGERY Left   ? as child; removal of straight pin from under knee cap.  ? PARTIAL NEPHRECTOMY Left   ? pinched nerves in back    ? SEPTOPLASTY    ? ? ?Social History  ? ?Socioeconomic History  ? Marital status:  Unknown  ?  Spouse name: Not on file  ? Number of children: Not on file  ? Years of education: Not on file  ? Highest education level: Not on file  ?Occupational History  ? Not on file  ?Tobacco Use  ? Smoking status: Every Day  ?  Packs/day: 1.00  ?  Years: 31.00  ?  Pack years: 31.00  ?  Types: Cigarettes  ? Smokeless tobacco: Never  ? Tobacco comments:  ?  smokes 1.5 packs per day 04/28/2020  ?Vaping Use  ? Vaping Use: Never used  ?Substance and Sexual Activity  ? Alcohol use: Yes  ?  Comment: occasional  ? Drug use: No  ? Sexual activity: Yes  ?Other Topics Concern  ? Not on file  ?Social History Narrative  ? Works as a Dealer, Careers adviser  ? ?Social Determinants of Health  ? ?Financial Resource Strain: Not on file  ?Food Insecurity: Not on file  ?Transportation Needs: Not on file  ?Physical Activity: Not on file  ?Stress: Not on file  ?Social Connections: Not on file  ?Intimate Partner Violence: Not on file  ? ? ?Current Outpatient Medications on File Prior to Visit  ?Medication Sig Dispense Refill  ? Accu-Chek FastClix Lancets MISC 1 each by  Does not apply route 2 (two) times daily. E11.9 102 each 2  ? albuterol (PROAIR HFA) 108 (90 Base) MCG/ACT inhaler Inhale 2 puffs into the lungs every 6 (six) hours as needed for wheezing or shortness of breath. 1 Inhaler 4  ? albuterol (PROVENTIL) (2.5 MG/3ML) 0.083% nebulizer solution Take 2.5 mg by nebulization every 6 (six) hours as needed for wheezing or shortness of breath.    ? Blood Glucose Monitoring Suppl (ACCU-CHEK AVIVA PLUS) w/Device KIT 1 each by Does not apply route 2 (two) times daily. E11.9 1 kit 0  ? budesonide-formoterol (SYMBICORT) 160-4.5 MCG/ACT inhaler Take 2 puffs first thing in am and then another 2 puffs about 12 hours later. 1 each 12  ? busPIRone (BUSPAR) 7.5 MG tablet Take 7.5 mg by mouth 2 (two) times daily as needed.    ? Butalbital-APAP-Caffeine 50-300-40 MG CAPS Take 1 capsule by mouth every 4 (four) hours as needed.    ?  citalopram (CELEXA) 40 MG tablet Take 40 mg by mouth daily.    ? diazepam (VALIUM) 5 MG tablet   0  ? Erenumab-aooe (AIMOVIG) 140 MG/ML SOAJ Aimovig Autoinjector 140 mg/mL subcutaneous auto-injector ? Inject 1 mL every month by subcutaneous route.    ? exenatide (BYETTA 10 MCG PEN) 10 MCG/0.04ML SOPN injection Inject 0.04 mLs (10 mcg total) into the skin 2 (two) times daily with a meal. 1 pen 11  ? Exenatide ER (BYDUREON BCISE) 2 MG/0.85ML AUIJ Inject 2 mg into the skin once a week. 4 pen 11  ? fluconazole (DIFLUCAN) 150 MG tablet Take 1 tablet (150 mg total) by mouth daily. 3 tablet 5  ? gabapentin (NEURONTIN) 300 MG capsule Take 600 mg by mouth 3 (three) times daily. Reported on 01/25/2016    ? GLOBAL EASE INJECT PEN NEEDLES 31G X 8 MM MISC USE UP TO 4 TIMES A DAY AS DIRECTED. 100 each PRN  ? glucose blood (ACCU-CHEK AVIVA PLUS) test strip 1 each by Other route 2 (two) times daily. E11.9 100 strip 2  ? HYDROcodone-acetaminophen (NORCO) 10-325 MG tablet   0  ? JARDIANCE 25 MG TABS tablet TAKE 1 TABLET BY MOUTH ONCE DAILY. 30 tablet 0  ? metFORMIN (GLUCOPHAGE) 500 MG tablet TAKE 2 TABLETS TWICE DAILY WITH MEALS. 120 tablet 0  ? metoprolol succinate (TOPROL-XL) 25 MG 24 hr tablet Take 25 mg by mouth daily.    ? montelukast (SINGULAIR) 10 MG tablet Take 1 tablet (10 mg total) by mouth at bedtime. 30 tablet 11  ? naproxen (NAPROSYN) 500 MG tablet Take 1 tablet (500 mg total) by mouth 2 (two) times daily. 30 tablet 0  ? OXYGEN 2lpm with sleep and occ with exertion ?[name]    ? predniSONE (DELTASONE) 10 MG tablet Take  4 each am x 2 days,   2 each am x 2 days,  1 each am x 2 days and stop 14 tablet 0  ? sildenafil (VIAGRA) 100 MG tablet TAKE 1/2 TO 1 TABLET BY MOUTH DAILY AS NEEDED FOR ERECTILE DYSFUNCTION. 4 tablet 0  ? Tiotropium Bromide Monohydrate (SPIRIVA RESPIMAT) 2.5 MCG/ACT AERS Inhale 2 puffs into the lungs daily. 4 g 11  ? Tiotropium Bromide Monohydrate (SPIRIVA RESPIMAT) 2.5 MCG/ACT AERS Inhale 2 puffs into the  lungs daily. 4 g 0  ? topiramate (TOPAMAX) 25 MG tablet 50 mg 2 (two) times daily.  0  ? vortioxetine HBr (TRINTELLIX) 20 MG TABS tablet Take 20 mg by mouth daily.    ? ?No  current facility-administered medications on file prior to visit.  ? ? ?Allergies  ?Allergen Reactions  ? Penicillins   ? ? ?Family History  ?Problem Relation Age of Onset  ? Breast cancer Mother   ? Melanoma Father   ? Diabetes Sister   ? ? ?BP 106/70 (BP Location: Left Arm, Patient Position: Sitting, Cuff Size: Normal)   Pulse 96   Ht 6' (1.829 m)   Wt 163 lb 3.2 oz (74 kg)   SpO2 95%   BMI 22.13 kg/m?  ? ?Review of Systems ?He denies hypoglycemia.  He has lost 25 lbs since last ov here.   ?   ?Objective:  ? Physical Exam ?VITAL SIGNS:  See vs page.   ?GENERAL: no distress.   ? ? ?A1c=9.0% ?   ?Assessment & Plan:  ?Insulin-requiring type 2 DM: uncontrolled, oss due to prednisone.  ?Noncompliance with f/u.  As of now, he does not qualify for pump.   ? ?Patient Instructions  ?I have sent 2 prescriptions to your pharmacy: for the Lantus, and a reduced amount of Novolog ?check your blood sugar twice a day.  vary the time of day when you check, between before the 3 meals, and at bedtime.  also check if you have symptoms of your blood sugar being too high or too low.  please keep a record of the readings and bring it to your next appointment here (or you can bring the meter itself).  You can write it on any piece of paper.  please call us sooner if your blood sugar goes below 70, or if you have a lot of readings over 200.     ?Please come back for a follow-up appointment in 3 months.   ? ? ?

## 2021-10-04 ENCOUNTER — Ambulatory Visit (HOSPITAL_COMMUNITY)
Admission: RE | Admit: 2021-10-04 | Discharge: 2021-10-04 | Disposition: A | Discharge status: Home / Self Care | Payer: Medicaid Other | Source: Ambulatory Visit | Attending: Internal Medicine | Admitting: Internal Medicine

## 2021-10-04 DIAGNOSIS — J449 Chronic obstructive pulmonary disease, unspecified: Secondary | ICD-10-CM | POA: Insufficient documentation

## 2021-10-04 DIAGNOSIS — J9611 Chronic respiratory failure with hypoxia: Secondary | ICD-10-CM | POA: Diagnosis present

## 2021-11-16 ENCOUNTER — Telehealth: Payer: Self-pay

## 2021-11-16 NOTE — Telephone Encounter (Signed)
Patient called regarding prior authorization for [facility] G6 transmitter. Unable to located Dexcom on patient's med list. Patient says that the Dexcom G6 was prescribed by another provider. Called his pharmacy to receive clarification and the previous prescriber was [employer]. Informed the patient that he may need to contact the office to obtain a PA since she is the current prescriber. ?

## 2021-11-30 ENCOUNTER — Telehealth: Payer: Self-pay | Admitting: Internal Medicine

## 2021-11-30 NOTE — Telephone Encounter (Signed)
Called patient but he did not answer. Left message for him to call back. Will call back this afternoon due to the oxygen levels.

## 2021-11-30 NOTE — Telephone Encounter (Signed)
ATC patient x2, mailbox was full so unable to leave message

## 2021-12-04 ENCOUNTER — Encounter: Payer: Self-pay | Admitting: *Deleted

## 2021-12-04 ENCOUNTER — Telehealth: Payer: Self-pay | Admitting: Internal Medicine

## 2021-12-04 MED ORDER — PREDNISONE 10 MG PO TABS: ORAL_TABLET | ORAL | 0 refills | Status: DC

## 2021-12-04 MED ORDER — AZITHROMYCIN 250 MG PO TABS: 250.0000 mg | ORAL_TABLET | ORAL | 0 refills | Status: DC

## 2021-12-04 NOTE — Telephone Encounter (Signed)
Tried calling the pt and still no answer and mailbox still full  Will mail letter per protocol

## 2021-12-04 NOTE — Telephone Encounter (Signed)
I called and spoke with the pt and notified of response per MW  Pt verbalized understanding  Rx sent to his preferred pharm  Nothing further needed

## 2021-12-04 NOTE — Telephone Encounter (Signed)
Zpak/ Prednisone 10 mg take  4 each am x 2 days,   2 each am x 2 days,  1 each am x 2 days and stop  

## 2021-12-04 NOTE — Telephone Encounter (Signed)
Spoke with the pt He is c/o increased SOB, wheezing, cough with thick, yellow sputum since 11/30/21  He denies fever, aches  He is taking his spiriva and symbicort daily and has been using albuterol inhaler and neb with some relief  He declined appt due to no transportation  He is asking for something to be called in  Please advise, thanks!    Last ov recs:  Let me know if 02 saturations are dropping below 89% with activities you enjoy doing      Prednisone 10 mg take  4 each am x 2 days,   2 each am x 2 days,  1 each am x 2 days and stop    Breztri = symbicort/spiriva and Take 2 puffs first thing in am and then another 2 puffs about 12 hours later.    Work on inhaler technique:  relax and gently blow all the way out then take a nice smooth full deep breath back in, triggering the inhaler at same time you start breathing in.  Hold for up to 5 seconds if you can. Blow out thru nose. Rinse and gargle with water when done.  If mouth or throat bother you at all,  try brushing teeth/gums/tongue with arm and hammer toothpaste/ make a slurry and gargle and spit out.          Please schedule a follow up visit in 6  months but call sooner if needed - bring your inhalers   Allergies  Allergen Reactions   Penicillins

## 2021-12-10 ENCOUNTER — Ambulatory Visit: Payer: Medicaid Other | Admitting: Nutrition

## 2022-01-02 ENCOUNTER — Telehealth: Payer: Self-pay | Admitting: Internal Medicine

## 2022-01-02 NOTE — Telephone Encounter (Signed)
Please give him a prescription for doxycycline 100 mg twice daily for 5 days Give him a prescription for prednisone taper: Take '40mg'$  daily for 3 days, then '30mg'$  daily for 3 days, then '20mg'$  daily for 3 days, then '10mg'$  daily for 3 days, then stop If he is not improving then he needs to call back, be set up with OV

## 2022-01-02 NOTE — Telephone Encounter (Signed)
Primary Pulmonologist:  Last office visit and with whom: Wert 09/26/21 What do we see them for (pulmonary problems): Chronic respiratory failure with hypoxia on nocturnal oxygen only, COPD Gold III, still smoking with restrictive component Last OV assessment/plan:    Assessment & Plan Note by Tanda Rockers, MD at 09/28/2021 6:31 AM  Author: Tanda Rockers, MD Author Type: Physician Filed: 09/28/2021  6:32 AM  Note Status: Written Cosign: Cosign Not Required Encounter Date: 09/26/2021  Problem: Current smoker  Editor: Tanda Rockers, MD (Physician)               4-5 min discussion re active cigarette smoking in addition to office E&M   Ask about tobacco use:   ongoing Advise quitting   I took an extended  opportunity with this patient to outline the consequences of continued cigarette use  in airway disorders based on all the data we have from the multiple national lung health studies (perfomed over decades at millions of dollars in cost)  indicating that smoking cessation, not choice of inhalers or physicians, is the most important aspect of care.   Assess willingness:  Not committed at this point Assist in quit attempt:  Per PCP when ready Arrange follow up:   Follow up per Primary Care planned                Patient Instructions by Tanda Rockers, MD at 09/26/2021 3:15 PM  Author: Tanda Rockers, MD Author Type: Physician Filed: 09/26/2021  3:59 PM  Note Status: Addendum Cosign: Cosign Not Required Encounter Date: 09/26/2021  Editor: Tanda Rockers, MD (Physician)      Prior Versions: 1. Tanda Rockers, MD (Physician) at 09/26/2021  3:58 PM - Signed    Let me know if 02 saturations are dropping below 89% with activities you enjoy doing      Prednisone 10 mg take  4 each am x 2 days,   2 each am x 2 days,  1 each am x 2 days and stop    Breztri = symbicort/spiriva and Take 2 puffs first thing in am and then another 2 puffs about 12 hours later.    Work on inhaler technique:   relax and gently blow all the way out then take a nice smooth full deep breath back in, triggering the inhaler at same time you start breathing in.  Hold for up to 5 seconds if you can. Blow out thru nose. Rinse and gargle with water when done.  If mouth or throat bother you at all,  try brushing teeth/gums/tongue with arm and hammer toothpaste/ make a slurry and gargle and spit out.          Please schedule a follow up visit in 6  months but call sooner if needed - bring your inhalers        Orthostatic Vitals Recorded in This Encounter   09/26/2021  1649     Patient Position: Sitting  BP Location: Left Arm   Instructions  Let me know if 02 saturations are dropping below 89% with activities you enjoy doing      Prednisone 10 mg take  4 each am x 2 days,   2 each am x 2 days,  1 each am x 2 days and stop    Breztri = symbicort/spiriva and Take 2 puffs first thing in am and then another 2 puffs about 12 hours later.    Work on inhaler technique:  relax  and gently blow all the way out then take a nice smooth full deep breath back in, triggering the inhaler at same time you start breathing in.  Hold for up to 5 seconds if you can. Blow out thru nose. Rinse and gargle with water when done.  If mouth or throat bother you at all,  try brushing teeth/gums/tongue with arm and hammer toothpaste/ make a slurry and gargle and spit out.          Please schedule a follow up visit in 6  months but call sooner if needed - bring your inhalers       Was appointment offered to patient (explain)?  Decined OV when offered by front office staff.   Reason for call: Patient reports sob and productive cough for 2-3 days with thick white mucous with a little yellow.  Still smoking.  Starting process of stopping smoking.  Using Symbicort and Spiriva as prescribed and albuterol 2-3/day.  Wheezing and has rattle in chest.  Sats while sitting are 91% on RA.  Denies any fever, chills or body aches.  Patient is  requesting an antibiotic and prednisone, states he has a lung infection.  Advised I would get information to one of our providers and we will call him back once we have recommendations.  Dr. Lamonte Sakai, please advise.  Thank you.  (examples of things to ask: : When did symptoms start? Fever? Cough? Productive? Color to sputum? More sputum than usual? Wheezing? Have you needed increased oxygen? Are you taking your respiratory medications? What over the counter measures have you tried?)  Allergies  Allergen Reactions   Penicillins     Immunization History  Administered Date(s) Administered   Influenza,inj,Quad PF,6+ Mos 03/29/2016, 05/13/2018, 04/28/2020   Influenza,inj,quad, With Preservative 05/02/2017   Pneumococcal Conjugate-13 05/13/2018   Pneumococcal Polysaccharide-23 03/17/2012   Pneumococcal-Unspecified 07/08/2012   Tdap 02/16/2016

## 2022-01-02 NOTE — Telephone Encounter (Signed)
Called and spoke with patient but he did not answer. Left message for him to call back.

## 2022-01-03 MED ORDER — PREDNISONE 10 MG PO TABS: ORAL_TABLET | ORAL | 0 refills | Status: DC

## 2022-01-03 MED ORDER — DOXYCYCLINE HYCLATE 100 MG PO TABS: 100.0000 mg | ORAL_TABLET | Freq: Two times a day (BID) | ORAL | 0 refills | Status: DC

## 2022-01-03 NOTE — Telephone Encounter (Signed)
Patient is returning phone call. Patient phone number is [phone].

## 2022-01-03 NOTE — Telephone Encounter (Signed)
Called and spoke with patient. He verbalized understanding of recommendations. RXs have been sent to pharmacy.   Nothing further needed at time of call.

## 2022-01-31 ENCOUNTER — Other Ambulatory Visit: Payer: Self-pay | Admitting: Family Medicine

## 2022-01-31 ENCOUNTER — Other Ambulatory Visit (HOSPITAL_COMMUNITY): Payer: Self-pay | Admitting: Family Medicine

## 2022-01-31 ENCOUNTER — Ambulatory Visit: Payer: Medicaid Other | Admitting: Nutrition

## 2022-01-31 DIAGNOSIS — R634 Abnormal weight loss: Secondary | ICD-10-CM

## 2022-01-31 DIAGNOSIS — Z85528 Personal history of other malignant neoplasm of kidney: Secondary | ICD-10-CM

## 2022-02-08 ENCOUNTER — Ambulatory Visit: Payer: Medicaid Other | Admitting: Internal Medicine

## 2022-02-22 ENCOUNTER — Other Ambulatory Visit (HOSPITAL_COMMUNITY): Payer: Medicaid Other

## 2022-02-25 ENCOUNTER — Ambulatory Visit (HOSPITAL_COMMUNITY)
Admission: RE | Admit: 2022-02-25 | Discharge: 2022-02-25 | Disposition: A | Discharge status: Home / Self Care | Payer: Medicaid Other | Source: Ambulatory Visit | Attending: Family Medicine | Admitting: Family Medicine

## 2022-02-25 DIAGNOSIS — Z85528 Personal history of other malignant neoplasm of kidney: Secondary | ICD-10-CM | POA: Diagnosis present

## 2022-02-25 DIAGNOSIS — R634 Abnormal weight loss: Secondary | ICD-10-CM | POA: Insufficient documentation

## 2022-02-25 LAB — POCT I-STAT CREATININE: Creatinine, Ser: 0.6 mg/dL — ABNORMAL LOW (ref 0.61–1.24)

## 2022-02-25 MED ORDER — IOHEXOL 300 MG/ML  SOLN
100.0000 mL | Freq: Once | INTRAMUSCULAR | Status: AC | PRN
  Administered 2022-02-25: 100 mL via INTRAVENOUS

## 2022-02-27 ENCOUNTER — Other Ambulatory Visit (HOSPITAL_COMMUNITY): Payer: Self-pay | Admitting: Family Medicine

## 2022-02-27 ENCOUNTER — Other Ambulatory Visit: Payer: Self-pay | Admitting: Family Medicine

## 2022-02-27 DIAGNOSIS — R634 Abnormal weight loss: Secondary | ICD-10-CM

## 2022-02-27 DIAGNOSIS — C649 Malignant neoplasm of unspecified kidney, except renal pelvis: Secondary | ICD-10-CM

## 2022-04-25 ENCOUNTER — Ambulatory Visit: Payer: Medicaid Other | Admitting: Internal Medicine

## 2022-05-10 ENCOUNTER — Ambulatory Visit: Payer: Medicaid Other | Admitting: Internal Medicine

## 2022-07-10 ENCOUNTER — Ambulatory Visit: Payer: Medicaid Other | Admitting: Internal Medicine

## 2022-07-10 NOTE — Progress Notes (Deleted)
Patient ID: Matthew Dougherty, male   DOB: 29-May-1976, 47 y.o.   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 Pt. previously saw Dr. Loanne Drilling, last visit 9 months ago.  Reviewed HbA1c: Lab Results  Component Value Date   HGBA1C 9.0 (A) 10/03/2021   HGBA1C 9.1 (A) 03/02/2019   HGBA1C 9.5 (A) 09/22/2018   HGBA1C 11.6 (A) 06/16/2018   HGBA1C 11.7 (A) 04/30/2018   HGBA1C 10.4 11/10/2017   HGBA1C 10.5 07/08/2017   HGBA1C 10.6 12/10/2016   HGBA1C 9.5 (H) 05/13/2016   HGBA1C 11.6 (H) 01/16/2016   Pt is on a regimen of: - Metformin 1000 mg 2x a day, with meals - Jardiance 25 mg before breakfast - Byetta 10 mg 2x a day before meals - Bydureon BCise 2 mg weekly - Lantus 20 units at bedtime - Novolog 0-5 units 3x a day, before meals - muscle cramps  Pt checks his sugars *** a day and they are: - am: n/c - 2h after b'fast: n/c - before lunch: n/c - 2h after lunch: n/c - before dinner: n/c - 2h after dinner: n/c - bedtime: n/c - nighttime: n/c Lowest sugar was ***; he has hypoglycemia awareness at 70.  Highest sugar was ***.  Glucometer: Accu-Chek Aviva  Pt's meals are: - Breakfast: - Lunch: - Dinner: - Snacks:  He gets intermittent steroid courses for his COPD.  - no CKD, last BUN/creatinine:  Lab Results  Component Value Date   BUN 11 07/23/2018   BUN 10 05/13/2016   CREATININE 0.60 (L) 02/25/2022   CREATININE 0.91 07/23/2018   Lab Results  Component Value Date   MICRALBCREAT 3.6 06/17/2018  He is not on ACE inhibitor or ARB.  -+ HL; last set of lipids: Lab Results  Component Value Date   CHOL 201 (H) 05/13/2016   HDL 34 (L) 05/13/2016   LDLCALC 106 (H) 05/13/2016   TRIG 305 (H) 05/13/2016  He is not on a statin.  - last eye exam was on 02/24/2019. + DR OS.  - + numbness and tingling in his feet.  Last foot  exam was in 2020.  On Neurontin 600 mg 3x a day.  He has a history of kidney cancer-s/p partial nephrectomy in 2010, COPD, restless leg syndrome, osteoarthritis depression, history of seizures.  ROS: + see HPI No increased urination, blurry vision, nausea, chest pain.  Past Medical History:  Diagnosis Date   Arthritis    Asthma    Cancer ([place])    kidney   Chronic kidney disease    partial removal of left kidney; cancer   Chronic knee pain    COPD (chronic obstructive pulmonary disease) (HCC)    Depression    Diabetes mellitus without complication ([place])    Gout    Hypertension    Neuropathy    Osteoarthritis    Renal cancer ([place])    2010 left partial nephrectomy   Restless leg syndrome    Seizures ([place])    few years ago; states that he aspirated and had a seizure; no meds for seizures and none since then   Past Surgical History:  Procedure Laterality Date   KNEE SURGERY Left    as child; removal of straight pin from under knee cap.   PARTIAL NEPHRECTOMY Left    pinched nerves in back     SEPTOPLASTY  Social History   Socioeconomic History   Marital status: Unknown    Spouse name: Not on file   Number of children: Not on file   Years of education: Not on file   Highest education level: Not on file  Occupational History   Not on file  Tobacco Use   Smoking status: Every Day    Packs/day: 1.00    Years: 31.00    Total pack years: 31.00    Types: Cigarettes   Smokeless tobacco: Never   Tobacco comments:    smokes 1.5 packs per day 04/28/2020  Vaping Use   Vaping Use: Never used  Substance and Sexual Activity   Alcohol use: Yes    Comment: occasional   Drug use: No   Sexual activity: Yes  Other Topics Concern   Not on file  Social History Narrative   Works as a Dealer, Careers adviser   Social Determinants of Radio broadcast assistant Strain: Not on file  Food Insecurity: Not on file  Transportation Needs: Not on file  Physical Activity:  Not on file  Stress: Not on file  Social Connections: Not on file  Intimate Partner Violence: Not on file   Current Outpatient Medications on File Prior to Visit  Medication Sig Dispense Refill   Accu-Chek FastClix Lancets MISC 1 each by Does not apply route 2 (two) times daily. E11.9 102 each 2   albuterol (PROAIR HFA) 108 (90 Base) MCG/ACT inhaler Inhale 2 puffs into the lungs every 6 (six) hours as needed for wheezing or shortness of breath. 1 Inhaler 4   albuterol (PROVENTIL) (2.5 MG/3ML) 0.083% nebulizer solution Take 2.5 mg by nebulization every 6 (six) hours as needed for wheezing or shortness of breath.     azithromycin (ZITHROMAX) 250 MG tablet Take 1 tablet (250 mg total) by mouth as directed. 6 tablet 0   Blood Glucose Monitoring Suppl (ACCU-CHEK AVIVA PLUS) w/Device KIT 1 each by Does not apply route 2 (two) times daily. E11.9 1 kit 0   budesonide-formoterol (SYMBICORT) 160-4.5 MCG/ACT inhaler Take 2 puffs first thing in am and then another 2 puffs about 12 hours later. 1 each 12   busPIRone (BUSPAR) 7.5 MG tablet Take 7.5 mg by mouth 2 (two) times daily as needed.     Butalbital-APAP-Caffeine 50-300-40 MG CAPS Take 1 capsule by mouth every 4 (four) hours as needed.     citalopram (CELEXA) 40 MG tablet Take 40 mg by mouth daily.     diazepam (VALIUM) 5 MG tablet   0   doxycycline (VIBRA-TABS) 100 MG tablet Take 1 tablet (100 mg total) by mouth 2 (two) times daily. 10 tablet 0   Erenumab-aooe (AIMOVIG) 140 MG/ML SOAJ Aimovig Autoinjector 140 mg/mL subcutaneous auto-injector  Inject 1 mL every month by subcutaneous route.     exenatide (BYETTA 10 MCG PEN) 10 MCG/0.04ML SOPN injection Inject 0.04 mLs (10 mcg total) into the skin 2 (two) times daily with a meal. 1 pen 11   Exenatide ER (BYDUREON BCISE) 2 MG/0.85ML AUIJ Inject 2 mg into the skin once a week. 4 pen 11   fluconazole (DIFLUCAN) 150 MG tablet Take 1 tablet (150 mg total) by mouth daily. 3 tablet 5   gabapentin (NEURONTIN)  300 MG capsule Take 600 mg by mouth 3 (three) times daily. Reported on 01/25/2016     GLOBAL EASE INJECT PEN NEEDLES 31G X 8 MM MISC USE UP TO 4 TIMES A DAY AS DIRECTED. 100 each PRN  glucose blood (ACCU-CHEK AVIVA PLUS) test strip 1 each by Other route 2 (two) times daily. E11.9 100 strip 2   HYDROcodone-acetaminophen (NORCO) 10-325 MG tablet   0   insulin aspart (NOVOLOG FLEXPEN) 100 UNIT/ML FlexPen Inject 0-5 Units into the skin 3 (three) times daily with meals. 30 mL 3   insulin glargine (LANTUS SOLOSTAR) 100 UNIT/ML Solostar Pen Inject 20 Units into the skin every morning. And pen needles 4/day 30 mL 3   JARDIANCE 25 MG TABS tablet TAKE 1 TABLET BY MOUTH ONCE DAILY. 30 tablet 0   metFORMIN (GLUCOPHAGE) 500 MG tablet TAKE 2 TABLETS TWICE DAILY WITH MEALS. 120 tablet 0   metoprolol succinate (TOPROL-XL) 25 MG 24 hr tablet Take 25 mg by mouth daily.     montelukast (SINGULAIR) 10 MG tablet Take 1 tablet (10 mg total) by mouth at bedtime. 30 tablet 11   naproxen (NAPROSYN) 500 MG tablet Take 1 tablet (500 mg total) by mouth 2 (two) times daily. 30 tablet 0   OXYGEN 2lpm with sleep and occ with exertion [name]     predniSONE (DELTASONE) 10 MG tablet Take 4 tabs by mouth for 3 days, then 3 for 3 days, 2 for 3 days, 1 for 3 days and stop 30 tablet 0   sildenafil (VIAGRA) 100 MG tablet TAKE 1/2 TO 1 TABLET BY MOUTH DAILY AS NEEDED FOR ERECTILE DYSFUNCTION. 4 tablet 0   Tiotropium Bromide Monohydrate (SPIRIVA RESPIMAT) 2.5 MCG/ACT AERS Inhale 2 puffs into the lungs daily. 4 g 11   Tiotropium Bromide Monohydrate (SPIRIVA RESPIMAT) 2.5 MCG/ACT AERS Inhale 2 puffs into the lungs daily. 4 g 0   topiramate (TOPAMAX) 25 MG tablet 50 mg 2 (two) times daily.  0   vortioxetine HBr (TRINTELLIX) 20 MG TABS tablet Take 20 mg by mouth daily.     No current facility-administered medications on file prior to visit.   Allergies  Allergen Reactions   Penicillins    Family History  Problem Relation Age of Onset    Breast cancer Mother    Melanoma Father    Diabetes Sister    PE: There were no vitals taken for this visit. Wt Readings from Last 3 Encounters:  10/03/21 163 lb 3.2 oz (74 kg)  09/26/21 162 lb 12.8 oz (73.8 kg)  04/28/20 169 lb 9.6 oz (76.9 kg)   Constitutional: overweight, in NAD Eyes:  EOMI, no exophthalmos ENT: no neck masses, no cervical lymphadenopathy Cardiovascular: RRR, No MRG Respiratory: CTA B Musculoskeletal: no deformities Skin:no rashes Neurological: no tremor with outstretched hands  ASSESSMENT: 1. DM2, insulin-dependent, uncontrolled, with  complications - DR - PN - on Neurontin - ED - on PDE5 inh. He also has CKD, likely after nephrectomy.  2. HL  PLAN:  1. Patient with long-standing, uncontrolled diabetes, on oral antidiabetic regimen, with still poor control.  Latest HbA1c is from 09/2021 it was 9.0%.  At today's visit, HbA1c is **.  - I suggested to:  There are no Patient Instructions on file for this visit. - check sugars at different times of the day - check 1x a day, rotating checks - discussed about CBG targets for treatment: 80-130 mg/dL before meals and <180 mg/dL after meals; target HbA1c <7%. - given sugar log and advised how to fill it and to bring it at next appt  - given foot care handout  - given instructions for hypoglycemia management "15-15 rule"  - advised for yearly eye exams  - Return to clinic in  3-4 months  2. HL - Reviewed latest lipid panel from 2017: All fractions abnormal: Lab Results  Component Value Date   CHOL 201 (H) 05/13/2016   HDL 34 (L) 05/13/2016   LDLCALC 106 (H) 05/13/2016   TRIG 305 (H) 05/13/2016  - he is not on a statin - He is due for another lipid panel  Philemon Kingdom, MD PhD [facility] Endocrinology

## 2022-09-05 ENCOUNTER — Encounter: Payer: Self-pay | Admitting: Radiology

## 2023-01-20 ENCOUNTER — Encounter: Payer: Self-pay | Admitting: Gastroenterology

## 2023-01-20 ENCOUNTER — Ambulatory Visit: Payer: MEDICAID | Admitting: Gastroenterology

## 2023-01-20 NOTE — Progress Notes (Deleted)
GI Office Note    Referring Provider: Ignatius Specking, MD Primary Care Physician:  Ignatius Specking, MD  Primary Gastroenterologist: ***  Chief Complaint   No chief complaint on file.  History of Present Illness   Matthew Dougherty is a 47 y.o. male presenting today at the request of Vyas, Dhruv B, MD for dysphagia.  Recent hospitalization at [facility] from 7/11 - 7/14 he was treated for right lower extremity cellulitis and probable pneumonia/COPD exacerbation.  Underwent CTA which was negative for PE.  Did show mucoid material in bilateral lung suggesting probable pneumonia.  Discharged on antibiotics for pneumonia as well as RLE cellulitis.  Was treated with Solu-Medrol while inpatient.   Current Outpatient Medications  Medication Sig Dispense Refill   Accu-Chek FastClix Lancets MISC 1 each by Does not apply route 2 (two) times daily. E11.9 102 each 2   albuterol (PROAIR HFA) 108 (90 Base) MCG/ACT inhaler Inhale 2 puffs into the lungs every 6 (six) hours as needed for wheezing or shortness of breath. 1 Inhaler 4   albuterol (PROVENTIL) (2.5 MG/3ML) 0.083% nebulizer solution Take 2.5 mg by nebulization every 6 (six) hours as needed for wheezing or shortness of breath.     azithromycin (ZITHROMAX) 250 MG tablet Take 1 tablet (250 mg total) by mouth as directed. 6 tablet 0   Blood Glucose Monitoring Suppl (ACCU-CHEK AVIVA PLUS) w/Device KIT 1 each by Does not apply route 2 (two) times daily. E11.9 1 kit 0   budesonide-formoterol (SYMBICORT) 160-4.5 MCG/ACT inhaler Take 2 puffs first thing in am and then another 2 puffs about 12 hours later. 1 each 12   busPIRone (BUSPAR) 7.5 MG tablet Take 7.5 mg by mouth 2 (two) times daily as needed.     Butalbital-APAP-Caffeine 50-300-40 MG CAPS Take 1 capsule by mouth every 4 (four) hours as needed.     citalopram (CELEXA) 40 MG tablet Take 40 mg by mouth daily.     diazepam (VALIUM) 5 MG tablet   0   doxycycline (VIBRA-TABS) 100 MG tablet Take 1 tablet  (100 mg total) by mouth 2 (two) times daily. 10 tablet 0   Erenumab-aooe (AIMOVIG) 140 MG/ML SOAJ Aimovig Autoinjector 140 mg/mL subcutaneous auto-injector  Inject 1 mL every month by subcutaneous route.     exenatide (BYETTA 10 MCG PEN) 10 MCG/0.04ML SOPN injection Inject 0.04 mLs (10 mcg total) into the skin 2 (two) times daily with a meal. 1 pen 11   Exenatide ER (BYDUREON BCISE) 2 MG/0.85ML AUIJ Inject 2 mg into the skin once a week. 4 pen 11   fluconazole (DIFLUCAN) 150 MG tablet Take 1 tablet (150 mg total) by mouth daily. 3 tablet 5   gabapentin (NEURONTIN) 300 MG capsule Take 600 mg by mouth 3 (three) times daily. Reported on 01/25/2016     GLOBAL EASE INJECT PEN NEEDLES 31G X 8 MM MISC USE UP TO 4 TIMES A DAY AS DIRECTED. 100 each PRN   glucose blood (ACCU-CHEK AVIVA PLUS) test strip 1 each by Other route 2 (two) times daily. E11.9 100 strip 2   insulin aspart (NOVOLOG FLEXPEN) 100 UNIT/ML FlexPen Inject 0-5 Units into the skin 3 (three) times daily with meals. 30 mL 3   insulin glargine (LANTUS SOLOSTAR) 100 UNIT/ML Solostar Pen Inject 20 Units into the skin every morning. And pen needles 4/day 30 mL 3   JARDIANCE 25 MG TABS tablet TAKE 1 TABLET BY MOUTH ONCE DAILY. 30 tablet 0   metFORMIN (  GLUCOPHAGE) 500 MG tablet TAKE 2 TABLETS TWICE DAILY WITH MEALS. 120 tablet 0   metoprolol succinate (TOPROL-XL) 25 MG 24 hr tablet Take 25 mg by mouth daily.     montelukast (SINGULAIR) 10 MG tablet Take 1 tablet (10 mg total) by mouth at bedtime. 30 tablet 11   naproxen (NAPROSYN) 500 MG tablet Take 1 tablet (500 mg total) by mouth 2 (two) times daily. 30 tablet 0   OXYGEN 2lpm with sleep and occ with exertion [name]     predniSONE (DELTASONE) 10 MG tablet Take 4 tabs by mouth for 3 days, then 3 for 3 days, 2 for 3 days, 1 for 3 days and stop 30 tablet 0   sildenafil (VIAGRA) 100 MG tablet TAKE 1/2 TO 1 TABLET BY MOUTH DAILY AS NEEDED FOR ERECTILE DYSFUNCTION. 4 tablet 0   Tiotropium Bromide  Monohydrate (SPIRIVA RESPIMAT) 2.5 MCG/ACT AERS Inhale 2 puffs into the lungs daily. 4 g 11   Tiotropium Bromide Monohydrate (SPIRIVA RESPIMAT) 2.5 MCG/ACT AERS Inhale 2 puffs into the lungs daily. 4 g 0   topiramate (TOPAMAX) 25 MG tablet 50 mg 2 (two) times daily.  0   vortioxetine HBr (TRINTELLIX) 20 MG TABS tablet Take 20 mg by mouth daily.     No current facility-administered medications for this visit.    Past Medical History:  Diagnosis Date   Arthritis    Asthma    Cancer (HCC)    kidney   Chronic kidney disease    partial removal of left kidney; cancer   Chronic knee pain    COPD (chronic obstructive pulmonary disease) (HCC)    Depression    Diabetes mellitus without complication (HCC)    Gout    Hypertension    Neuropathy    Osteoarthritis    Renal cancer (HCC)    2010 left partial nephrectomy   Restless leg syndrome    Seizures (HCC)    few years ago; states that he aspirated and had a seizure; no meds for seizures and none since then    Past Surgical History:  Procedure Laterality Date   KNEE SURGERY Left    as child; removal of straight pin from under knee cap.   PARTIAL NEPHRECTOMY Left    pinched nerves in back     SEPTOPLASTY      Family History  Problem Relation Age of Onset   Breast cancer Mother    Melanoma Father    Diabetes Sister     Allergies as of 01/20/2023 - Review Complete 10/03/2021  Allergen Reaction Noted   Penicillins  06/20/2014    Social History   Socioeconomic History   Marital status: Unknown    Spouse name: Not on file   Number of children: Not on file   Years of education: Not on file   Highest education level: Not on file  Occupational History   Not on file  Tobacco Use   Smoking status: Every Day    Current packs/day: 1.00    Average packs/day: 1 pack/day for 31.0 years (31.0 ttl pk-yrs)    Types: Cigarettes   Smokeless tobacco: Never   Tobacco comments:    smokes 1.5 packs per day 04/28/2020  Vaping Use    Vaping status: Never Used  Substance and Sexual Activity   Alcohol use: Yes    Comment: occasional   Drug use: No   Sexual activity: Yes  Other Topics Concern   Not on file  Social History Narrative  Works as a Curator, Biomedical engineer   Social Determinants of Health   Financial Resource Strain: Medium Risk (01/17/2023)   Received from [facility]   Overall Financial Resource Strain (CARDIA)    Difficulty of Paying Living Expenses: Somewhat hard  Food Insecurity: Food Insecurity Present (01/17/2023)   [facility]   Hunger Vital Sign    Worried About Running Out of Food in the Last Year: Sometimes true    Ran Out of Food in the Last Year: Sometimes true  Transportation Needs: No Transportation Needs (01/17/2023)   Received from [facility]   PRAPARE - Transportation    Lack of Transportation (Medical): No    Lack of Transportation (Non-Medical): No  Physical Activity: Not on file  Stress: Not on file  Social Connections: Not on file  Intimate Partner Violence: Not on file     Review of Systems   Gen: Denies any fever, chills, fatigue, weight loss, lack of appetite.  CV: Denies chest pain, heart palpitations, peripheral edema, syncope.  Resp: Denies shortness of breath at rest or with exertion. Denies wheezing or cough.  GI: see HPI GU : Denies urinary burning, urinary frequency, urinary hesitancy MS: Denies joint pain, muscle weakness, cramps, or limitation of movement.  Derm: Denies rash, itching, dry skin Psych: Denies depression, anxiety, memory loss, and confusion Heme: Denies bruising, bleeding, and enlarged lymph nodes.   Physical Exam   There were no vitals taken for this visit.  General:   Alert and oriented. Pleasant and cooperative. Well-nourished and well-developed.  Head:  Normocephalic and atraumatic. Eyes:  Without icterus, sclera clear and conjunctiva pink.  Ears:  Normal auditory acuity. Mouth:  No deformity or lesions,  oral mucosa pink.  Lungs:  Clear to auscultation bilaterally. No wheezes, rales, or rhonchi. No distress.  Heart:  S1, S2 present without murmurs appreciated.  Abdomen:  +BS, soft, non-tender and non-distended. No HSM noted. No guarding or rebound. No masses appreciated.  Rectal:  Deferred  Msk:  Symmetrical without gross deformities. Normal posture. Extremities:  Without edema. Neurologic:  Alert and  oriented x4;  grossly normal neurologically. Skin:  Intact without significant lesions or rashes. Psych:  Alert and cooperative. Normal mood and affect.   Assessment   Matthew Dougherty is a 47 y.o. male with a history of COPD on home oxygen, renal cell carcinoma, chronic joint pain/osteoarthritis presenting today evaluation of dysphagia.  Dysphagia:   PLAN   ***    Brooke Bonito, MSN, FNP-BC, AGACNP-BC [facility]

## 2023-02-04 NOTE — Progress Notes (Signed)
GI Office Note    Referring Provider: Ignatius Specking, MD Primary Care Physician:  Ignatius Specking, MD  Primary Gastroenterologist: Hennie Duos. Marletta Lor, DO  Chief Complaint   Chief Complaint  Patient presents with   New Patient (Initial Visit)    Referred for Dysphagia for past 2 years   History of Present Illness   Matthew Dougherty is a 47 y.o. male presenting today at the request of Vyas, Dhruv B, MD for dysphagia.  CT A/P in August 2023: -No acute intra-abdominal or pelvic pathology -Postsurgical changes of partial left upper pole nephrectomy without recurrence or metastatic disease -Indeterminate 2 cm left adrenal nodule  Recent hospitalization at [facility] from 7/11 - 7/14 he was treated for right lower extremity cellulitis and probable pneumonia/COPD exacerbation.  Underwent CTA which was negative for PE.  Did show mucoid material in bilateral lung suggesting probable pneumonia.  Discharged on antibiotics for pneumonia as well as RLE cellulitis.  Was treated with Solu-Medrol while inpatient.   Feels like foods and tiny medication feel like they get stuck mid neck. Has to drink a little bit to get it to go down. Sometimes has globus sensation after that. Has been going on for about 2 years. Does report a frequent cough at times. Does report reflux but has been on pantoprazole 40 mg once daily for about 13 years. No prior EGD. At times has some N/V - occurs rarely but also mood anxiety related. Has needed to regurgitate at times.   About age 25 he reports a history of ulcers.   No prior colonoscopy, PCP mentioned age 38. No melena, brbpr. Gets some intermittent lower abdominal pain. Had a partial kidney removal in 2010 and wonders if that is phantom pain. Has some intermittent tenderness that occurs daily but short lived but has gotten used to it. Does report some constipation - BM about 6 days per week. Does have to strain. Stools are hard. Has tried increase fiber in diet. Rarely uses  milk of mag or mag citrate. Will take gas ex as needed. Avoids NSAIDs usually. Since his cellulitis he has admitted to some occasional use of NSAIDs  States his mom had to get a one way valve within her esophagus. Still has his tonsils in place.   Working on quitting smoking. Has tried chantix and had been cutting back and then was unable to get it for a while and working on trying to get back on it.   Still having some pain and leakage from the wounds to his LLE. Still quite red and peeling.   Has had some chest pain and shortness of breath. Depends on temperature outside and humidity. Chest pain is very intermittent.  Has not seen cardiology. Has known he has had a high heart for a while and has been on metoprolol. Uses home oxygen.   Wt Readings from Last 3 Encounters:  02/05/23 166 lb 6.4 oz (75.5 kg)  10/03/21 163 lb 3.2 oz (74 kg)  09/26/21 162 lb 12.8 oz (73.8 kg)   Current Outpatient Medications  Medication Sig Dispense Refill   Accu-Chek FastClix Lancets MISC 1 each by Does not apply route 2 (two) times daily. E11.9 102 each 2   albuterol (PROAIR HFA) 108 (90 Base) MCG/ACT inhaler Inhale 2 puffs into the lungs every 6 (six) hours as needed for wheezing or shortness of breath. 1 Inhaler 4   albuterol (PROVENTIL) (2.5 MG/3ML) 0.083% nebulizer solution Take 2.5 mg by nebulization every 6 (  six) hours as needed for wheezing or shortness of breath.     Blood Glucose Monitoring Suppl (ACCU-CHEK AVIVA PLUS) w/Device KIT 1 each by Does not apply route 2 (two) times daily. E11.9 1 kit 0   budesonide-formoterol (SYMBICORT) 160-4.5 MCG/ACT inhaler Take 2 puffs first thing in am and then another 2 puffs about 12 hours later. 1 each 12   Butalbital-APAP-Caffeine 50-300-40 MG CAPS Take 1 capsule by mouth every 4 (four) hours as needed.     citalopram (CELEXA) 40 MG tablet Take 40 mg by mouth daily.     diazepam (VALIUM) 5 MG tablet   0   Erenumab-aooe (AIMOVIG) 140 MG/ML SOAJ Aimovig Autoinjector  140 mg/mL subcutaneous auto-injector  Inject 1 mL every month by subcutaneous route.     Exenatide ER (BYDUREON BCISE) 2 MG/0.85ML AUIJ Inject 2 mg into the skin once a week. 4 pen 11   gabapentin (NEURONTIN) 300 MG capsule Take 600 mg by mouth 3 (three) times daily. Reported on 01/25/2016     GLOBAL EASE INJECT PEN NEEDLES 31G X 8 MM MISC USE UP TO 4 TIMES A DAY AS DIRECTED. 100 each PRN   glucose blood (ACCU-CHEK AVIVA PLUS) test strip 1 each by Other route 2 (two) times daily. E11.9 100 strip 2   insulin aspart (NOVOLOG FLEXPEN) 100 UNIT/ML FlexPen Inject 0-5 Units into the skin 3 (three) times daily with meals. 30 mL 3   insulin glargine (LANTUS SOLOSTAR) 100 UNIT/ML Solostar Pen Inject 20 Units into the skin every morning. And pen needles 4/day 30 mL 3   JARDIANCE 25 MG TABS tablet TAKE 1 TABLET BY MOUTH ONCE DAILY. 30 tablet 0   levofloxacin (LEVAQUIN) 500 MG tablet Take 500 mg by mouth daily.     metFORMIN (GLUCOPHAGE) 500 MG tablet TAKE 2 TABLETS TWICE DAILY WITH MEALS. 120 tablet 0   montelukast (SINGULAIR) 10 MG tablet Take 1 tablet (10 mg total) by mouth at bedtime. 30 tablet 11   naproxen (NAPROSYN) 500 MG tablet Take 1 tablet (500 mg total) by mouth 2 (two) times daily. 30 tablet 0   OXYGEN 2lpm with sleep and occ with exertion [name]     sildenafil (VIAGRA) 100 MG tablet TAKE 1/2 TO 1 TABLET BY MOUTH DAILY AS NEEDED FOR ERECTILE DYSFUNCTION. 4 tablet 0   Tiotropium Bromide Monohydrate (SPIRIVA RESPIMAT) 2.5 MCG/ACT AERS Inhale 2 puffs into the lungs daily. 4 g 11   Vitamin D, Ergocalciferol, 50000 units CAPS Take 1 capsule by mouth once a week.     atorvastatin (LIPITOR) 10 MG tablet Take 10 mg by mouth daily.     cetirizine (ZYRTEC) 10 MG tablet Take 10 mg by mouth daily.     exenatide (BYETTA 10 MCG PEN) 10 MCG/0.04ML SOPN injection Inject 0.04 mLs (10 mcg total) into the skin 2 (two) times daily with a meal. (Patient not taking: Reported on 02/05/2023) 1 pen 11   hydrOXYzine  (ATARAX) 25 MG tablet Take by mouth.     pantoprazole (PROTONIX) 40 MG tablet Take 40 mg by mouth daily.     tamsulosin (FLOMAX) 0.4 MG CAPS capsule Take by mouth daily.     Tiotropium Bromide Monohydrate (SPIRIVA RESPIMAT) 2.5 MCG/ACT AERS Inhale 2 puffs into the lungs daily. (Patient not taking: Reported on 02/05/2023) 4 g 0   vortioxetine HBr (TRINTELLIX) 20 MG TABS tablet Take 20 mg by mouth daily. (Patient not taking: Reported on 02/05/2023)     VYVANSE 20 MG capsule Take by  mouth.     No current facility-administered medications for this visit.    Past Medical History:  Diagnosis Date   Arthritis    Asthma    Cancer (HCC)    kidney   Chronic kidney disease    partial removal of left kidney; cancer   Chronic knee pain    COPD (chronic obstructive pulmonary disease) (HCC)    Depression    Diabetes mellitus without complication (HCC)    Gout    Hypertension    Neuropathy    Osteoarthritis    Renal cancer (HCC)    2010 left partial nephrectomy   Restless leg syndrome    Seizures (HCC)    few years ago; states that he aspirated and had a seizure; no meds for seizures and none since then    Past Surgical History:  Procedure Laterality Date   KNEE SURGERY Left    as child; removal of straight pin from under knee cap.   PARTIAL NEPHRECTOMY Left    pinched nerves in back     SEPTOPLASTY      Family History  Problem Relation Age of Onset   Breast cancer Mother    Melanoma Father    Diabetes Sister     Allergies as of 02/05/2023 - Review Complete 02/05/2023  Allergen Reaction Noted   Lyrica [pregabalin] Swelling 02/05/2023   Morphine Itching 02/05/2023   Penicillins  06/20/2014    Social History   Socioeconomic History   Marital status: Unknown    Spouse name: Not on file   Number of children: Not on file   Years of education: Not on file   Highest education level: Not on file  Occupational History   Not on file  Tobacco Use   Smoking status: Every Day     Current packs/day: 1.00    Average packs/day: 1 pack/day for 31.0 years (31.0 ttl pk-yrs)    Types: Cigarettes   Smokeless tobacco: Never   Tobacco comments:    smokes 1.5 packs per day 04/28/2020  Vaping Use   Vaping status: Never Used  Substance and Sexual Activity   Alcohol use: Yes    Comment: occasional   Drug use: No   Sexual activity: Yes  Other Topics Concern   Not on file  Social History Narrative   Works as a Curator, Biomedical engineer   Social Determinants of Health   Financial Resource Strain: Medium Risk (01/17/2023)   Received from [facility]   Overall Financial Resource Strain (CARDIA)    Difficulty of Paying Living Expenses: Somewhat hard  Food Insecurity: Food Insecurity Present (01/17/2023)   Received from [facility]   Hunger Vital Sign    Worried About Running Out of Food in the Last Year: Sometimes true    Ran Out of Food in the Last Year: Sometimes true  Transportation Needs: No Transportation Needs (01/17/2023)   Received from [facility]   PRAPARE - Transportation    Lack of Transportation (Medical): No    Lack of Transportation (Non-Medical): No  Physical Activity: Not on file  Stress: Not on file  Social Connections: Not on file  Intimate Partner Violence: Not on file     Review of Systems   Gen: + weight loss, lack of appetite. Denies any fever, chills, fatigue.  CV: Denies chest pain, heart palpitations, peripheral edema, syncope.  Resp: Denies shortness of breath at rest or with exertion. Denies wheezing or cough.  GI: see HPI GU :  Denies urinary burning, urinary frequency, urinary hesitancy MS: Denies joint pain, muscle weakness, cramps, or limitation of movement.  Derm: Denies rash, itching, dry skin Psych: Denies depression, anxiety, memory loss, and confusion Heme: Denies bruising, bleeding, and enlarged lymph nodes.   Physical Exam   BP 108/75   Pulse (!) 106   Temp 98.6 F (37 C)   Ht 6\' 1"  (1.854 m)   Wt 166  lb 6.4 oz (75.5 kg)   BMI 21.95 kg/m   General:   Alert and oriented. Pleasant and cooperative. Well-nourished and well-developed.  Head:  Normocephalic and atraumatic. Eyes:  Without icterus, sclera clear and conjunctiva pink.  Ears:  Normal auditory acuity. Mouth:  No deformity or lesions, oral mucosa pink.  Lungs:  Clear to auscultation bilaterally. No wheezes, rales, or rhonchi. No distress.  Heart:  S1, S2 present without murmurs appreciated.  Abdomen:  +BS, soft, non-tender and non-distended. No HSM noted. No guarding or rebound. No masses appreciated.  Rectal:  Deferred  Msk:  Symmetrical without gross deformities. Normal posture. Extremities:  LLE cellulitis present. Has covered intermittently with dressing but erythema to mid shin with mild edema peresnt,.  Neurologic:  Alert and  oriented x4;  grossly normal neurologically. Skin:  Intact without significant lesions or rashes. Psych:  Alert and cooperative. Normal mood and affect.  Assessment   Matthew Dougherty is a 47 y.o. male with a history of COPD on home oxygen, renal cell carcinoma, chronic joint pain/osteoarthritis, diabetes, Svt,  presenting today evaluation of dysphagia.   GERD, Dysphagia: Symptoms ongoing for about 2 years but becoming more frequent. Has globus sensation after foods feel like they are stuck mid neck, sometimes clears with liquids. Has frequent cough. Has been on PPI once daily for about 13 years. He notes being told he had ulcers at age 30 but denies a previous EGD. Feels need to regurgitate foods at times. States his mother has had an esophageal stent placed previously. Denies frequent NSAID use but is a daily smoker although working on quitting. Given symptoms and severity will schedule an EGD with dilation, this will also help identify any potential cause for weight loss and lack of appetite. Will in increase PPI to BID. Differentials include esophageal ring, web, stricture, stenosis, malignancy,  gastritis, duodenitis, peptic ulcers, h. Pylori.   Weight loss, lack of appetite: Reported a large amount of weight loss recently. Weight stable since May per review. At times can go all afternoon without having eaten for the day.  To assess remote weight loss and current symptoms, EGD will be obtained. Given these symptoms and reported weight loss there remains concern for malignancy which will be evaluated with EGD.   Constipation: Has BM majority of the days of the week but stools are hard and does admit to straining. Has tried increasing fiber in diet. Has used milk of mag and mag citrate in the past. Uses gas ex as needed for cramping. Has intermittent lower abdominal pain that is short interval and mild in nature. Advised daily stool softener as well as fiber supplementation.   Screening colon cancer: Has not had colonoscopy. Denies family history of colon cancer or colon polyps. Denies any melena or brbpr and states constipation is chronic. Will schedule at time of EGD.   Given intermittent chest pain and shortness of breath will need PCP clearance prior to proceeding with scheduling procedures.   PLAN   Proceed with upper endoscopy with dilation and colonoscopy with propofol by Dr. Marletta Lor  in near future: the risks, benefits, and alternatives have been discussed with the patient in detail. The patient states understanding and desires to proceed. ASA 4 Clearance from PCP for procedures given chest pain Hold Jardiance for 3 days Hold metformin night prior to morning of procedure Half dose of Lantus night prior Increase PPI to BID Dysphagia precautions GERD diet Start daily stool softener Benefiber daily Follow up in 3 months.    Brooke Bonito, MSN, FNP-BC, AGACNP-BC [facility]

## 2023-02-05 ENCOUNTER — Encounter: Payer: Self-pay | Admitting: Gastroenterology

## 2023-02-05 ENCOUNTER — Telehealth: Payer: Self-pay | Admitting: *Deleted

## 2023-02-05 ENCOUNTER — Ambulatory Visit (INDEPENDENT_AMBULATORY_CARE_PROVIDER_SITE_OTHER): Payer: MEDICAID | Admitting: Gastroenterology

## 2023-02-05 VITALS — BP 108/75 | HR 106 | Temp 98.6°F | Ht 73.0 in | Wt 166.4 lb

## 2023-02-05 DIAGNOSIS — K5900 Constipation, unspecified: Secondary | ICD-10-CM

## 2023-02-05 DIAGNOSIS — R634 Abnormal weight loss: Secondary | ICD-10-CM | POA: Diagnosis not present

## 2023-02-05 DIAGNOSIS — R630 Anorexia: Secondary | ICD-10-CM

## 2023-02-05 DIAGNOSIS — Z1211 Encounter for screening for malignant neoplasm of colon: Secondary | ICD-10-CM

## 2023-02-05 NOTE — Telephone Encounter (Signed)
Faxed clearance to PCP

## 2023-02-05 NOTE — Patient Instructions (Addendum)
Please increase your pantoprazole to 40 mg twice daily.  When it comes to eating and taking medications please try to take 1 medication at a time if you have issues with swallowing or if you are able to crush certain meds you could do this as well.  Please chew food is much as possible prior to swallowing and take smaller bites and alternate with sips of liquids.  If food gets stuck and you are unable to tolerate liquids afterward you should go to the ER immediately.  We will get you scheduled for an upper endoscopy with dilation as well as a colonoscopy in the near future.  We will get you scheduled after we receive clearance from your primary care provider.  For your constipation I want you to start taking a daily stool softener such as Colace (docusate sodium).  You would also likely benefit from extra fiber in your diet with something like Benefiber.  You can take 2-3 teaspoons daily in 8 ounces of water.  I will see for follow-up in about 3 months.  It was a pleasure to see you today. I want to create trusting relationships with patients. If you receive a survey regarding your visit,  I greatly appreciate you taking time to fill this out on paper or through your MyChart. I value your feedback.  Brooke Bonito, MSN, FNP-BC, AGACNP-BC [facility]

## 2023-02-05 NOTE — Telephone Encounter (Signed)
  Request for patient to stop medication prior to procedure or is needing cleareance  02/05/23  Matthew Dougherty 10-09-1975  What type of surgery is being performed? Colonoscopy/ Esophagogastroduodenoscopy (EGD) with esophageal dilation  When is surgery scheduled? TBD  What type of clearance is required (medical or pharmacy to hold medication or both? medical  Patient in needing clearance prior to being scheduled for procedures due to chest pain.  Name of physician performing surgery?  Dr.Carver [facility] [employer]: [phone] Fax: [phone]  Anethesia type (none, local, MAC, general)? MAC

## 2023-02-25 ENCOUNTER — Encounter: Payer: Self-pay | Admitting: *Deleted

## 2023-02-25 ENCOUNTER — Other Ambulatory Visit: Payer: Self-pay | Admitting: *Deleted

## 2023-02-25 MED ORDER — PEG 3350-KCL-NA BICARB-NACL 420 G PO SOLR: 4000.0000 mL | Freq: Once | ORAL | 0 refills | Status: AC

## 2023-02-25 NOTE — Telephone Encounter (Signed)
Clearance scanned under media tab. Please advise. Thank you.

## 2023-02-25 NOTE — Telephone Encounter (Signed)
Pt has been scheduled for 03/31/23. Instructions mailed and prep sent to the pharmacy

## 2023-02-25 NOTE — Telephone Encounter (Signed)
[facility] regarding the clearance. They will be faxing the clearance over.

## 2023-02-26 ENCOUNTER — Encounter: Payer: Self-pay | Admitting: *Deleted

## 2023-03-26 NOTE — Patient Instructions (Signed)
Matthew Dougherty  03/26/2023     @PREFPERIOPPHARMACY @   Your procedure is scheduled on  03/31/2023.   Report to [facility] at  0830  A.M.   Call this number if you have problems the morning of surgery:  [phone]  If you experience any cold or flu symptoms such as cough, fever, chills, shortness of breath, etc. between now and your scheduled surgery, please notify us at the above number.   Remember:  Follow the diet and prep instructions given to you by the office.     Your last dose of jardiance should be on 03/27/2023.      DO NOT take any medications for diabetes the morning of your procedure.     Use your nebulizer and your inhalers before you come and bring your rescue inhaler with you.     Take these medicines the morning of surgery with A SIP OF WATER             zyrtec, celexa, diazepam (if needed), gabapentin, hydroxyzine, pantoprazole, flomax, vyvanse.     Do not wear jewelry, make-up or nail polish, including gel polish,  artificial nails, or any other type of covering on natural nails (fingers and  toes).  Do not wear lotions, powders, or perfumes, or deodorant.  Do not shave 48 hours prior to surgery.  Men may shave face and neck.  Do not bring valuables to the hospital.  [facility] is not responsible for any belongings or valuables.  Contacts, dentures or bridgework may not be worn into surgery.  Leave your suitcase in the car.  After surgery it may be brought to your room.  For patients admitted to the hospital, discharge time will be determined by your treatment team.  Patients discharged the day of surgery will not be allowed to drive home and must have someone with them for 24 hours.    Special instructions:   DO NOT smoke tobacco or vape for 24 hours before your procedure.  Please read over the following fact sheets that you were given. Anesthesia Post-op Instructions and Care and Recovery After Surgery      Upper Endoscopy,  Adult, Care After After the procedure, it is common to have a sore throat. It is also common to have: Mild stomach pain or discomfort. Bloating. Nausea. Follow these instructions at home: The instructions below may help you care for yourself at home. Your health care provider may give you more instructions. If you have questions, ask your health care provider. If you were given a sedative during the procedure, it can affect you for several hours. Do not drive or operate machinery until your health care provider says that it is safe. If you will be going home right after the procedure, plan to have a responsible adult: Take you home from the hospital or clinic. You will not be allowed to drive. Care for you for the time you are told. Follow instructions from your health care provider about what you may eat and drink. Return to your normal activities as told by your health care provider. Ask your health care provider what activities are safe for you. Take over-the-counter and prescription medicines only as told by your health care provider. Contact a health care provider if you: Have a sore throat that lasts longer than one day. Have trouble swallowing. Have a fever. Get help right away if you: Vomit blood or your vomit looks like  coffee grounds. Have bloody, black, or tarry stools. Have a very bad sore throat or you cannot swallow. Have difficulty breathing or very bad pain in your chest or abdomen. These symptoms may be an emergency. Get help right away. Call 911. Do not wait to see if the symptoms will go away. Do not drive yourself to the hospital. Summary After the procedure, it is common to have a sore throat, mild stomach discomfort, bloating, and nausea. If you were given a sedative during the procedure, it can affect you for several hours. Do not drive until your health care provider says that it is safe. Follow instructions from your health care provider about what you may eat  and drink. Return to your normal activities as told by your health care provider. This information is not intended to replace advice given to you by your health care provider. Make sure you discuss any questions you have with your health care provider. Document Revised: 10/03/2021 Document Reviewed: 10/03/2021 Elsevier Patient Education  2024 [employer] Esophageal Dilatation Esophageal dilatation, also called esophageal dilation, is a procedure to widen or open a blocked or narrowed part of the esophagus. The esophagus is the part of the body that moves food and liquid from the mouth to the stomach. You may need this procedure if: You have a buildup of scar tissue in your esophagus that makes it difficult, painful, or impossible to swallow. This can be caused by gastroesophageal reflux disease (GERD). You have cancer of the esophagus. There is a problem with how food moves through your esophagus. In some cases, you may need this procedure repeated at a later time to dilate the esophagus gradually. Tell a health care provider about: Any allergies you have. All medicines you are taking, including vitamins, herbs, eye drops, creams, and over-the-counter medicines. Any problems you or family members have had with anesthetic medicines. Any blood disorders you have. Any surgeries you have had. Any medical conditions you have. Any antibiotic medicines you are required to take before dental procedures. Whether you are pregnant or may be pregnant. What are the risks? Generally, this is a safe procedure. However, problems may occur, including: Bleeding due to a tear in the lining of the esophagus. A hole, or perforation, in the esophagus. What happens before the procedure? Ask your health care provider about: Changing or stopping your regular medicines. This is especially important if you are taking diabetes medicines or blood thinners. Taking medicines such as aspirin and ibuprofen. These  medicines can thin your blood. Do not take these medicines unless your health care provider tells you to take them. Taking over-the-counter medicines, vitamins, herbs, and supplements. Follow instructions from your health care provider about eating or drinking restrictions. Plan to have a responsible adult take you home from the hospital or clinic. Plan to have a responsible adult care for you for the time you are told after you leave the hospital or clinic. This is important. What happens during the procedure? You may be given a medicine to help you relax (sedative). A numbing medicine may be sprayed into the back of your throat, or you may gargle the medicine. Your health care provider may perform the dilatation using various surgical instruments, such as: Simple dilators. This instrument is carefully placed in the esophagus to stretch it. Guided wire bougies. This involves using an endoscope to insert a wire into the esophagus. A dilator is passed over this wire to enlarge the esophagus. Then the wire is removed. Balloon dilators.  An endoscope with a small balloon is inserted into the esophagus. The balloon is inflated to stretch the esophagus and open it up. The procedure may vary among health care providers and hospitals. What can I expect after the procedure? Your blood pressure, heart rate, breathing rate, and blood oxygen level will be monitored until you leave the hospital or clinic. Your throat may feel slightly sore and numb. This will get better over time. You will not be allowed to eat or drink until your throat is no longer numb. When you are able to drink, urinate, and sit on the edge of the bed without nausea or dizziness, you may be able to return home. Follow these instructions at home: Take over-the-counter and prescription medicines only as told by your health care provider. If you were given a sedative during the procedure, it can affect you for several hours. Do not drive or  operate machinery until your health care provider says that it is safe. Plan to have a responsible adult care for you for the time you are told. This is important. Follow instructions from your health care provider about any eating or drinking restrictions. Do not use any products that contain nicotine or tobacco, such as cigarettes, e-cigarettes, and chewing tobacco. If you need help quitting, ask your health care provider. Keep all follow-up visits. This is important. Contact a health care provider if: You have a fever. You have pain that is not relieved by medicine. Get help right away if: You have chest pain. You have trouble breathing. You have trouble swallowing. You vomit blood. You have black, tarry, or bloody stools. These symptoms may represent a serious problem that is an emergency. Do not wait to see if the symptoms will go away. Get medical help right away. Call your local emergency services (911 in the [place] Do not drive yourself to the hospital. Summary Esophageal dilatation, also called esophageal dilation, is a procedure to widen or open a blocked or narrowed part of the esophagus. Plan to have a responsible adult take you home from the hospital or clinic. For this procedure, a numbing medicine may be sprayed into the back of your throat, or you may gargle the medicine. Do not drive or operate machinery until your health care provider says that it is safe. This information is not intended to replace advice given to you by your health care provider. Make sure you discuss any questions you have with your health care provider. Document Revised: 11/10/2019 Document Reviewed: 11/10/2019 Elsevier Patient Education  2024 [employer] Colonoscopy, Adult, Care After The following information offers guidance on how to care for yourself after your procedure. Your health care provider may also give you more specific instructions. If you have problems or questions, contact your health  care provider. What can I expect after the procedure? After the procedure, it is common to have: A small amount of blood in your stool for 24 hours after the procedure. Some gas. Mild cramping or bloating of your abdomen. Follow these instructions at home: Eating and drinking  Drink enough fluid to keep your urine pale yellow. Follow instructions from your health care provider about eating or drinking restrictions. Resume your normal diet as told by your health care provider. Avoid heavy or fried foods that are hard to digest. Activity Rest as told by your health care provider. Avoid sitting for a long time without moving. Get up to take short walks every 1-2 hours. This is important to improve blood flow  and breathing. Ask for help if you feel weak or unsteady. Return to your normal activities as told by your health care provider. Ask your health care provider what activities are safe for you. Managing cramping and bloating  Try walking around when you have cramps or feel bloated. If directed, apply heat to your abdomen as told by your health care provider. Use the heat source that your health care provider recommends, such as a moist heat pack or a heating pad. Place a towel between your skin and the heat source. Leave the heat on for 20-30 minutes. Remove the heat if your skin turns bright red. This is especially important if you are unable to feel pain, heat, or cold. You have a greater risk of getting burned. General instructions If you were given a sedative during the procedure, it can affect you for several hours. Do not drive or operate machinery until your health care provider says that it is safe. For the first 24 hours after the procedure: Do not sign important documents. Do not drink alcohol. Do your regular daily activities at a slower pace than normal. Eat soft foods that are easy to digest. Take over-the-counter and prescription medicines only as told by your health care  provider. Keep all follow-up visits. This is important. Contact a health care provider if: You have blood in your stool 2-3 days after the procedure. Get help right away if: You have more than a small spotting of blood in your stool. You have large blood clots in your stool. You have swelling of your abdomen. You have nausea or vomiting. You have a fever. You have increasing pain in your abdomen that is not relieved with medicine. These symptoms may be an emergency. Get help right away. Call 911. Do not wait to see if the symptoms will go away. Do not drive yourself to the hospital. Summary After the procedure, it is common to have a small amount of blood in your stool. You may also have mild cramping and bloating of your abdomen. If you were given a sedative during the procedure, it can affect you for several hours. Do not drive or operate machinery until your health care provider says that it is safe. Get help right away if you have a lot of blood in your stool, nausea or vomiting, a fever, or increased pain in your abdomen. This information is not intended to replace advice given to you by your health care provider. Make sure you discuss any questions you have with your health care provider. Document Revised: 08/06/2022 Document Reviewed: 02/14/2021 Elsevier Patient Education  2024 [employer] Monitored Anesthesia Care, Care After The following information offers guidance on how to care for yourself after your procedure. Your health care provider may also give you more specific instructions. If you have problems or questions, contact your health care provider. What can I expect after the procedure? After the procedure, it is common to have: Tiredness. Little or no memory about what happened during or after the procedure. Impaired judgment when it comes to making decisions. Nausea or vomiting. Some trouble with balance. Follow these instructions at home: For the time period you  were told by your health care provider:  Rest. Do not participate in activities where you could fall or become injured. Do not drive or use machinery. Do not drink alcohol. Do not take sleeping pills or medicines that cause drowsiness. Do not make important decisions or sign legal documents. Do not take care of children  on your own. Medicines Take over-the-counter and prescription medicines only as told by your health care provider. If you were prescribed antibiotics, take them as told by your health care provider. Do not stop using the antibiotic even if you start to feel better. Eating and drinking Follow instructions from your health care provider about what you may eat and drink. Drink enough fluid to keep your urine pale yellow. If you vomit: Drink clear fluids slowly and in small amounts as you are able. Clear fluids include water, ice chips, low-calorie sports drinks, and fruit juice that has water added to it (diluted fruit juice). Eat light and bland foods in small amounts as you are able. These foods include bananas, applesauce, rice, lean meats, toast, and crackers. General instructions  Have a responsible adult stay with you for the time you are told. It is important to have someone help care for you until you are awake and alert. If you have sleep apnea, surgery and some medicines can increase your risk for breathing problems. Follow instructions from your health care provider about wearing your sleep device: When you are sleeping. This includes during daytime naps. While taking prescription pain medicines, sleeping medicines, or medicines that make you drowsy. Do not use any products that contain nicotine or tobacco. These products include cigarettes, chewing tobacco, and vaping devices, such as e-cigarettes. If you need help quitting, ask your health care provider. Contact a health care provider if: You feel nauseous or vomit every time you eat or drink. You feel  light-headed. You are still sleepy or having trouble with balance after 24 hours. You get a rash. You have a fever. You have redness or swelling around the IV site. Get help right away if: You have trouble breathing. You have new confusion after you get home. These symptoms may be an emergency. Get help right away. Call 911. Do not wait to see if the symptoms will go away. Do not drive yourself to the hospital. This information is not intended to replace advice given to you by your health care provider. Make sure you discuss any questions you have with your health care provider. Document Revised: 11/19/2021 Document Reviewed: 11/19/2021 Elsevier Patient Education  2024 [employer].

## 2023-03-27 ENCOUNTER — Other Ambulatory Visit: Payer: Self-pay

## 2023-03-27 ENCOUNTER — Encounter (HOSPITAL_COMMUNITY)
Admission: RE | Admit: 2023-03-27 | Discharge: 2023-03-27 | Disposition: A | Discharge status: Home / Self Care | Payer: MEDICAID | Source: Ambulatory Visit | Attending: Internal Medicine | Admitting: Internal Medicine

## 2023-03-27 ENCOUNTER — Encounter (HOSPITAL_COMMUNITY): Payer: Self-pay

## 2023-03-27 VITALS — BP 101/75 | HR 100 | Temp 98.5°F | Resp 18 | Ht 73.0 in | Wt 166.4 lb

## 2023-03-27 DIAGNOSIS — R001 Bradycardia, unspecified: Secondary | ICD-10-CM | POA: Insufficient documentation

## 2023-03-27 DIAGNOSIS — F17200 Nicotine dependence, unspecified, uncomplicated: Secondary | ICD-10-CM | POA: Insufficient documentation

## 2023-03-27 DIAGNOSIS — Z01810 Encounter for preprocedural cardiovascular examination: Secondary | ICD-10-CM | POA: Insufficient documentation

## 2023-03-27 DIAGNOSIS — I10 Essential (primary) hypertension: Secondary | ICD-10-CM | POA: Insufficient documentation

## 2023-03-27 DIAGNOSIS — Z01818 Encounter for other preprocedural examination: Secondary | ICD-10-CM | POA: Diagnosis present

## 2023-03-31 ENCOUNTER — Encounter (HOSPITAL_COMMUNITY): Payer: Self-pay

## 2023-03-31 ENCOUNTER — Encounter (HOSPITAL_COMMUNITY)
Admission: RE | Disposition: A | Discharge status: Home / Self Care | Payer: Self-pay | Source: Home / Self Care | Attending: Internal Medicine

## 2023-03-31 ENCOUNTER — Ambulatory Visit (HOSPITAL_COMMUNITY)
Admission: RE | Admit: 2023-03-31 | Discharge: 2023-03-31 | Disposition: A | Discharge status: Home / Self Care | Payer: MEDICAID | Attending: Internal Medicine | Admitting: Internal Medicine

## 2023-03-31 ENCOUNTER — Ambulatory Visit (HOSPITAL_COMMUNITY): Payer: MEDICAID | Admitting: Anesthesiology

## 2023-03-31 ENCOUNTER — Encounter: Payer: Self-pay | Admitting: Gastroenterology

## 2023-03-31 ENCOUNTER — Ambulatory Visit (HOSPITAL_BASED_OUTPATIENT_CLINIC_OR_DEPARTMENT_OTHER): Payer: MEDICAID | Admitting: Anesthesiology

## 2023-03-31 DIAGNOSIS — R1310 Dysphagia, unspecified: Secondary | ICD-10-CM | POA: Insufficient documentation

## 2023-03-31 DIAGNOSIS — I10 Essential (primary) hypertension: Secondary | ICD-10-CM | POA: Diagnosis not present

## 2023-03-31 DIAGNOSIS — K2970 Gastritis, unspecified, without bleeding: Secondary | ICD-10-CM

## 2023-03-31 DIAGNOSIS — E1122 Type 2 diabetes mellitus with diabetic chronic kidney disease: Secondary | ICD-10-CM | POA: Insufficient documentation

## 2023-03-31 DIAGNOSIS — K2100 Gastro-esophageal reflux disease with esophagitis, without bleeding: Secondary | ICD-10-CM

## 2023-03-31 DIAGNOSIS — N189 Chronic kidney disease, unspecified: Secondary | ICD-10-CM | POA: Insufficient documentation

## 2023-03-31 DIAGNOSIS — K2289 Other specified disease of esophagus: Secondary | ICD-10-CM | POA: Diagnosis not present

## 2023-03-31 DIAGNOSIS — K3189 Other diseases of stomach and duodenum: Secondary | ICD-10-CM | POA: Diagnosis not present

## 2023-03-31 DIAGNOSIS — F17210 Nicotine dependence, cigarettes, uncomplicated: Secondary | ICD-10-CM

## 2023-03-31 DIAGNOSIS — I129 Hypertensive chronic kidney disease with stage 1 through stage 4 chronic kidney disease, or unspecified chronic kidney disease: Secondary | ICD-10-CM | POA: Insufficient documentation

## 2023-03-31 DIAGNOSIS — K2950 Unspecified chronic gastritis without bleeding: Secondary | ICD-10-CM

## 2023-03-31 DIAGNOSIS — K648 Other hemorrhoids: Secondary | ICD-10-CM | POA: Insufficient documentation

## 2023-03-31 DIAGNOSIS — Z85528 Personal history of other malignant neoplasm of kidney: Secondary | ICD-10-CM | POA: Diagnosis not present

## 2023-03-31 DIAGNOSIS — Z1211 Encounter for screening for malignant neoplasm of colon: Secondary | ICD-10-CM | POA: Diagnosis present

## 2023-03-31 DIAGNOSIS — Z833 Family history of diabetes mellitus: Secondary | ICD-10-CM | POA: Diagnosis not present

## 2023-03-31 HISTORY — PX: BIOPSY: SHX5522

## 2023-03-31 HISTORY — PX: COLONOSCOPY WITH PROPOFOL: SHX5780

## 2023-03-31 HISTORY — PX: ESOPHAGOGASTRODUODENOSCOPY (EGD) WITH PROPOFOL: SHX5813

## 2023-03-31 LAB — GLUCOSE, CAPILLARY: Glucose-Capillary: 189 mg/dL — ABNORMAL HIGH (ref 70–99)

## 2023-03-31 SURGERY — COLONOSCOPY WITH PROPOFOL
Anesthesia: General

## 2023-03-31 MED ORDER — LACTATED RINGERS IV SOLN: INTRAVENOUS | Status: DC

## 2023-03-31 MED ORDER — PROPOFOL 10 MG/ML IV BOLUS
INTRAVENOUS | Status: DC | PRN
  Administered 2023-03-31: 130 mg via INTRAVENOUS
  Administered 2023-03-31 (×2): 20 mg via INTRAVENOUS

## 2023-03-31 MED ORDER — LIDOCAINE HCL (CARDIAC) PF 100 MG/5ML IV SOSY
PREFILLED_SYRINGE | INTRAVENOUS | Status: DC | PRN
  Administered 2023-03-31: 60 mg via INTRAVENOUS

## 2023-03-31 MED ORDER — PHENYLEPHRINE HCL (PRESSORS) 10 MG/ML IV SOLN
INTRAVENOUS | Status: DC | PRN
  Administered 2023-03-31 (×2): 80 ug via INTRAVENOUS

## 2023-03-31 MED ORDER — PROPOFOL 500 MG/50ML IV EMUL
INTRAVENOUS | Status: DC | PRN
  Administered 2023-03-31: 100 ug/kg/min via INTRAVENOUS

## 2023-03-31 MED ORDER — PANTOPRAZOLE SODIUM 40 MG PO TBEC: 40.0000 mg | DELAYED_RELEASE_TABLET | Freq: Two times a day (BID) | ORAL | 11 refills | Status: AC

## 2023-03-31 MED ORDER — STERILE WATER FOR IRRIGATION IR SOLN
Status: DC | PRN
  Administered 2023-03-31: 60 mL

## 2023-03-31 NOTE — H&P (Signed)
Primary Care Physician:  Ignatius Specking, MD Primary Gastroenterologist:  Dr. Marletta Lor  Pre-Procedure History & Physical: HPI:  Matthew Dougherty is a 47 y.o. male is here  for an upper endoscopy with possible dilation for GERD/dysphagia and a colonoscopy to be performed for colon cancer screening purposes.  Past Medical History:  Diagnosis Date   Arthritis    Asthma    Cancer (HCC)    kidney   Chronic kidney disease    partial removal of left kidney; cancer   Chronic knee pain    COPD (chronic obstructive pulmonary disease) (HCC)    Depression    Diabetes mellitus without complication (HCC)    Gout    Hypertension    Neuropathy    Osteoarthritis    Renal cancer (HCC)    2010 left partial nephrectomy   Restless leg syndrome    Seizures (HCC)    few years ago; states that he aspirated and had a seizure; no meds for seizures and none since then    Past Surgical History:  Procedure Laterality Date   KNEE SURGERY Left    as child; removal of straight pin from under knee cap.   PARTIAL NEPHRECTOMY Left    pinched nerves in back     SEPTOPLASTY      Prior to Admission medications   Medication Sig Start Date End Date Taking? Authorizing Provider  Accu-Chek FastClix Lancets MISC 1 each by Does not apply route 2 (two) times daily. E11.9 06/15/19  Yes Romero Belling, MD  albuterol [facility] HFA) 108 (90 Base) MCG/ACT inhaler Inhale 2 puffs into the lungs every 6 (six) hours as needed for wheezing or shortness of breath. 05/13/18  Yes Glenford Bayley, NP  albuterol (PROVENTIL) (2.5 MG/3ML) 0.083% nebulizer solution Take 2.5 mg by nebulization every 6 (six) hours as needed for wheezing or shortness of breath.   Yes [provider]  atorvastatin (LIPITOR) 10 MG tablet Take 10 mg by mouth daily.   Yes [provider]  Blood Glucose Monitoring Suppl (ACCU-CHEK AVIVA PLUS) w/Device KIT 1 each by Does not apply route 2 (two) times daily. E11.9 06/15/19  Yes Romero Belling, MD   budesonide-formoterol [facility]) 160-4.5 MCG/ACT inhaler Take 2 puffs first thing in am and then another 2 puffs about 12 hours later. 09/26/21  Yes Nyoka Cowden, MD  Butalbital-APAP-Caffeine 50-300-40 MG CAPS Take 1 capsule by mouth every 4 (four) hours as needed.   Yes [provider]  cetirizine (ZYRTEC) 10 MG tablet Take 10 mg by mouth daily.   Yes [provider]  citalopram (CELEXA) 40 MG tablet Take 40 mg by mouth daily.   Yes [provider]  diazepam (VALIUM) 5 MG tablet  08/09/17  Yes [provider]  Erenumab-aooe (AIMOVIG) 140 MG/ML SOAJ Aimovig Autoinjector 140 mg/mL subcutaneous auto-injector  Inject 1 mL every month by subcutaneous route.   Yes [provider]  Exenatide ER (BYDUREON BCISE) 2 MG/0.85ML AUIJ Inject 2 mg into the skin once a week. 05/17/19  Yes Romero Belling, MD  gabapentin (NEURONTIN) 300 MG capsule Take 600 mg by mouth 3 (three) times daily. Reported on 01/25/2016   Yes [provider]  GLOBAL EASE INJECT PEN NEEDLES 31G X 8 MM MISC USE UP TO 4 TIMES A DAY AS DIRECTED. 07/24/19  Yes Romero Belling, MD  glucose blood (ACCU-CHEK AVIVA PLUS) test strip 1 each by Other route 2 (two) times daily. E11.9 06/15/19  Yes Romero Belling, MD  hydrOXYzine (ATARAX) 25 MG tablet Take by mouth.   Yes [provider]  insulin aspart (NOVOLOG FLEXPEN) 100 UNIT/ML FlexPen Inject 0-5 Units into the skin 3 (three) times daily with meals. 10/03/21  Yes Romero Belling, MD  insulin glargine (LANTUS SOLOSTAR) 100 UNIT/ML Solostar Pen Inject 20 Units into the skin every morning. And pen needles 4/day 10/03/21  Yes Romero Belling, MD  JARDIANCE 25 MG TABS tablet TAKE 1 TABLET BY MOUTH ONCE DAILY. 09/14/19  Yes Romero Belling, MD  levofloxacin (LEVAQUIN) 500 MG tablet Take 500 mg by mouth daily. 01/30/23  Yes [provider]  metFORMIN (GLUCOPHAGE) 500 MG tablet TAKE 2 TABLETS TWICE DAILY WITH MEALS. 12/13/19  Yes Romero Belling, MD   montelukast (SINGULAIR) 10 MG tablet Take 1 tablet (10 mg total) by mouth at bedtime. 05/13/18  Yes Glenford Bayley, NP  naproxen (NAPROSYN) 500 MG tablet Take 1 tablet (500 mg total) by mouth 2 (two) times daily. 12/05/17  Yes Horton, Mayer Masker, MD  OXYGEN 2lpm with sleep and occ with exertion [name]   Yes [provider]  pantoprazole (PROTONIX) 40 MG tablet Take 40 mg by mouth daily.   Yes [provider]  sildenafil (VIAGRA) 100 MG tablet TAKE 1/2 TO 1 TABLET BY MOUTH DAILY AS NEEDED FOR ERECTILE DYSFUNCTION. 06/07/20  Yes Romero Belling, MD  tamsulosin (FLOMAX) 0.4 MG CAPS capsule Take by mouth daily.   Yes [provider]  Tiotropium Bromide Monohydrate (SPIRIVA RESPIMAT) 2.5 MCG/ACT AERS Inhale 2 puffs into the lungs daily. 09/26/21  Yes Nyoka Cowden, MD  Vitamin D, Ergocalciferol, 50000 units CAPS Take 1 capsule by mouth once a week. 09/16/22  Yes [provider]  VYVANSE 20 MG capsule Take by mouth.   Yes [provider]  exenatide (BYETTA 10 MCG PEN) 10 MCG/0.04ML SOPN injection Inject 0.04 mLs (10 mcg total) into the skin 2 (two) times daily with a meal. Patient not taking: Reported on 02/05/2023 09/21/19   Romero Belling, MD  Tiotropium Bromide Monohydrate (SPIRIVA RESPIMAT) 2.5 MCG/ACT AERS Inhale 2 puffs into the lungs daily. Patient not taking: Reported on 02/05/2023 09/26/21   Nyoka Cowden, MD  vortioxetine HBr (TRINTELLIX) 20 MG TABS tablet Take 20 mg by mouth daily. Patient not taking: Reported on 02/05/2023    [provider]    Allergies as of 02/25/2023 - Review Complete 02/05/2023  Allergen Reaction Noted   Lyrica [pregabalin] Swelling 02/05/2023   Morphine Itching 02/05/2023   Penicillins  06/20/2014    Family History  Problem Relation Age of Onset   Breast cancer Mother    Melanoma Father    Diabetes Sister     Social History   Socioeconomic History   Marital status: Unknown    Spouse name: Not on file    Number of children: Not on file   Years of education: Not on file   Highest education level: Not on file  Occupational History   Not on file  Tobacco Use   Smoking status: Every Day    Current packs/day: 2.00    Average packs/day: 1.4 packs/day for 55.6 years (80.1 ttl pk-yrs)    Types: Cigarettes    Start date: 09/1998   Smokeless tobacco: Never   Tobacco comments:    smokes  2.0 packs per day 04/28/2020  Vaping Use   Vaping status: Never Used  Substance and Sexual Activity   Alcohol use: Yes    Comment: occasional   Drug use: No  Sexual activity: Yes  Other Topics Concern   Not on file  Social History Narrative   Works as a Curator, Biomedical engineer   Social Determinants of Health   Financial Resource Strain: Medium Risk (01/17/2023)   Received from [facility]   Overall Financial Resource Strain (CARDIA)    Difficulty of Paying Living Expenses: Somewhat hard  Food Insecurity: Food Insecurity Present (01/17/2023)   Received [facility]   Hunger Vital Sign    Worried About Running Out of Food in the Last Year: Sometimes true    Ran Out of Food in the Last Year: Sometimes true  Transportation Needs: No Transportation Needs (01/17/2023)   Received from [facility]   PRAPARE - Transportation    Lack of Transportation (Medical): No    Lack of Transportation (Non-Medical): No  Physical Activity: Not on file  Stress: Not on file  Social Connections: Not on file  Intimate Partner Violence: Not on file    Review of Systems: See HPI, otherwise negative ROS  Physical Exam: Vital signs in last 24 hours:     General:   Alert,  Well-developed, well-nourished, pleasant and cooperative in NAD Head:  Normocephalic and atraumatic. Eyes:  Sclera clear, no icterus.   Conjunctiva pink. Ears:  Normal auditory acuity. Nose:  No deformity, discharge,  or lesions. Msk:  Symmetrical without gross deformities. Normal posture. Extremities:  Without clubbing or  edema. Neurologic:  Alert and  oriented x4;  grossly normal neurologically. Skin:  Intact without significant lesions or rashes. Psych:  Alert and cooperative. Normal mood and affect.  Impression/Plan: Matthew Dougherty is here for an upper endoscopy with possible dilation for GERD/dysphagia and a colonoscopy to be performed for colon cancer screening purposes.  The risks of the procedure including infection, bleed, or perforation as well as benefits, limitations, alternatives and imponderables have been reviewed with the patient. Questions have been answered. All parties agreeable.

## 2023-03-31 NOTE — Op Note (Signed)
[facility] Patient Name: Matthew Dougherty Procedure Date: 03/31/2023 10:23 AM MRN: [id number] Date of Birth: 03-24-1976 Attending MD: Hennie Duos. Marletta Lor , [place], [id number] CSN: [id number] Age: 47 Admit Type: Outpatient Procedure:                Colonoscopy Indications:              Screening for colorectal malignant neoplasm Providers:                Hennie Duos. Marletta Lor, DO, Angelica Ran, Lennice Sites                            Technician, Technician Referring MD:             Hennie Duos. Marletta Lor, DO Medicines:                See the Anesthesia note for documentation of the                            administered medications Complications:            No immediate complications. Estimated Blood Loss:     Estimated blood loss: none. Procedure:                Pre-Anesthesia Assessment:                           - The anesthesia plan was to use monitored                            anesthesia care (MAC).                           After obtaining informed consent, the colonoscope                            was passed under direct vision. Throughout the                            procedure, the patient's blood pressure, pulse, and                            oxygen saturations were monitored continuously. The                            [id number]-HQ190L ([id number]) was introduced through the                            anus and advanced to the the cecum, identified by                            appendiceal orifice and ileocecal valve. The                            colonoscopy was performed without difficulty. The                            patient tolerated the procedure  well. The quality                            of the bowel preparation was evaluated using the                            BBPS [facility] Bowel Preparation Scale) with scores                            of: Right Colon = 2 (minor amount of residual                            staining, small fragments of stool and/or opaque                             liquid, but mucosa seen well), Transverse Colon = 2                            (minor amount of residual staining, small fragments                            of stool and/or opaque liquid, but mucosa seen                            well) and Left Colon = 2 (minor amount of residual                            staining, small fragments of stool and/or opaque                            liquid, but mucosa seen well). The total BBPS score                            equals 6. Fair. Scope In: 10:51:31 AM Scope Out: 11:06:13 AM Scope Withdrawal Time: 0 hours 9 minutes 30 seconds  Total Procedure Duration: 0 hours 14 minutes 42 seconds  Findings:      Non-bleeding internal hemorrhoids were found during endoscopy.      A moderate amount of semi-liquid semi-solid stool was found in the       entire colon, making visualization difficult. Lavage of the area was       performed using copious amounts of sterile water, resulting in clearance       with fair visualization. Impression:               - Non-bleeding internal hemorrhoids.                           - Stool in the entire examined colon.                           - No specimens collected. Moderate Sedation:      Per Anesthesia Care Recommendation:           - Patient has a contact number available for  emergencies. The signs and symptoms of potential                            delayed complications were discussed with the                            patient. Return to normal activities tomorrow.                            Written discharge instructions were provided to the                            patient.                           - Resume previous diet.                           - Continue present medications.                           - Repeat colonoscopy in 10 years for screening                            purposes. Recommend 2 day extended prep                           - Return to GI clinic in 3  months. Procedure Code(s):        --- Professional ---                           W0981, Colorectal cancer screening; colonoscopy on                            individual not meeting criteria for high risk Diagnosis Code(s):        --- Professional ---                           Z12.11, Encounter for screening for malignant                            neoplasm of colon                           K64.8, Other hemorrhoids CPT copyright 2022 American Medical Association. All rights reserved. The codes documented in this report are preliminary and upon coder review may  be revised to meet current compliance requirements. Hennie Duos. Marletta Lor, DO Hennie Duos. Marletta Lor, DO 03/31/2023 11:09:39 AM This report has been signed electronically. Number of Addenda: 0

## 2023-03-31 NOTE — Transfer of Care (Signed)
Immediate Anesthesia Transfer of Care Note  Patient: Matthew Dougherty  Procedure(s) Performed: COLONOSCOPY WITH PROPOFOL ESOPHAGOGASTRODUODENOSCOPY (EGD) WITH PROPOFOL BIOPSY  Patient Location: Short Stay  Anesthesia Type:General  Level of Consciousness: awake and patient cooperative  Airway & Oxygen Therapy: Patient Spontanous Breathing  Post-op Assessment: Report given to RN and Post -op Vital signs reviewed and stable  Post vital signs: Reviewed and stable  Last Vitals:  Vitals Value Taken Time  BP 105/64 03/31/23 1111  Temp 36.6 C 03/31/23 1111  Pulse 98 03/31/23 1113  Resp 23 03/31/23 1113  SpO2 100 % 03/31/23 1113  Vitals shown include unfiled device data.  Last Pain:  Vitals:   03/31/23 1111  TempSrc: Oral  PainSc: 0-No pain         Complications: No notable events documented.

## 2023-03-31 NOTE — Op Note (Signed)
[facility] Patient Name: Matthew Dougherty Procedure Date: 03/31/2023 10:28 AM MRN: [id number] Date of Birth: 03/15/1976 Attending MD: Hennie Duos. Marletta Lor , [place], [id number] CSN: [id number] Age: 47 Admit Type: Outpatient Procedure:                Upper GI endoscopy Indications:              Dysphagia Providers:                Hennie Duos. Marletta Lor, DO, Angelica Ran, Lennice Sites                            Technician, Technician Referring MD:             Hennie Duos. Marletta Lor, DO Medicines:                See the Anesthesia note for documentation of the                            administered medications Complications:            No immediate complications. Estimated Blood Loss:     Estimated blood loss was minimal. Procedure:                Pre-Anesthesia Assessment:                           - The anesthesia plan was to use monitored                            anesthesia care (MAC).                           After obtaining informed consent, the endoscope was                            passed under direct vision. Throughout the                            procedure, the patient's blood pressure, pulse, and                            oxygen saturations were monitored continuously. The                            [id number]-[id number] ([id number]) scope was introduced through the                            mouth, and advanced to the second part of duodenum.                            The upper GI endoscopy was accomplished without                            difficulty. The patient tolerated the procedure                            well. Scope In:  10:41:37 AM Scope Out: 10:46:22 AM Total Procedure Duration: 0 hours 4 minutes 45 seconds  Findings:      There is no endoscopic evidence of stenosis or stricture in the entire       esophagus.      The esophagus and gastroesophageal junction were examined with white       light and narrow band imaging (NBI) from a forward view and retroflexed       position. There  were esophageal mucosal changes suspicious for       short-segment Barrett's esophagus. These changes involved the mucosa       extending to the Z-line. One tongue of salmon-colored mucosa was present       and islands of salmon-colored mucosa were present. The maximum       longitudinal extent of these esophageal mucosal changes was 1 cm in       length. Mucosa was biopsied with a cold forceps for histology. One       specimen bottle was sent to pathology.      Diffuse mild inflammation characterized by erythema was found in the       entire examined stomach. Biopsies were taken with a cold forceps for       Helicobacter pylori testing.      The duodenal bulb, first portion of the duodenum and second portion of       the duodenum were normal. Impression:               - Esophageal mucosal changes suspicious for                            short-segment Barrett's esophagus. Biopsied.                           - Gastritis. Biopsied.                           - Normal duodenal bulb, first portion of the                            duodenum and second portion of the duodenum. Moderate Sedation:      Per Anesthesia Care Recommendation:           - Patient has a contact number available for                            emergencies. The signs and symptoms of potential                            delayed complications were discussed with the                            patient. Return to normal activities tomorrow.                            Written discharge instructions were provided to the                            patient.                           -  Resume previous diet.                           - Continue present medications.                           - Await pathology results.                           - Use Protonix (pantoprazole) 40 mg PO BID.                           - Return to GI clinic in 3 months.                           - Consider MBSS Procedure Code(s):        --- Professional  ---                           [phone], Esophagogastroduodenoscopy, flexible,                            transoral; with biopsy, single or multiple Diagnosis Code(s):        --- Professional ---                           K22.89, Other specified disease of esophagus                           K29.70, Gastritis, unspecified, without bleeding                           R13.10, Dysphagia, unspecified CPT copyright 2022 American Medical Association. All rights reserved. The codes documented in this report are preliminary and upon coder review may  be revised to meet current compliance requirements. Hennie Duos. Marletta Lor, DO Hennie Duos. Marletta Lor, DO 03/31/2023 10:50:55 AM This report has been signed electronically. Number of Addenda: 0

## 2023-03-31 NOTE — Discharge Instructions (Signed)
EGD Discharge instructions Please read the instructions outlined below and refer to this sheet in the next few weeks. These discharge instructions provide you with general information on caring for yourself after you leave the hospital. Your doctor may also give you specific instructions. While your treatment has been planned according to the most current medical practices available, unavoidable complications occasionally occur. If you have any problems or questions after discharge, please call your doctor. ACTIVITY You may resume your regular activity but move at a slower pace for the next 24 hours.  Take frequent rest periods for the next 24 hours.  Walking will help expel (get rid of) the air and reduce the bloated feeling in your abdomen.  No driving for 24 hours (because of the anesthesia (medicine) used during the test).  You may shower.  Do not sign any important legal documents or operate any machinery for 24 hours (because of the anesthesia used during the test).  NUTRITION Drink plenty of fluids.  You may resume your normal diet.  Begin with a light meal and progress to your normal diet.  Avoid alcoholic beverages for 24 hours or as instructed by your caregiver.  MEDICATIONS You may resume your normal medications unless your caregiver tells you otherwise.  WHAT YOU CAN EXPECT TODAY You may experience abdominal discomfort such as a feeling of fullness or "gas" pains.  FOLLOW-UP Your doctor will discuss the results of your test with you.  SEEK IMMEDIATE MEDICAL ATTENTION IF ANY OF THE FOLLOWING OCCUR: Excessive nausea (feeling sick to your stomach) and/or vomiting.  Severe abdominal pain and distention (swelling).  Trouble swallowing.  Temperature over 101 F (37.8 C).  Rectal bleeding or vomiting of blood.     Colonoscopy Discharge Instructions  Read the instructions outlined below and refer to this sheet in the next few weeks. These discharge instructions provide you with  general information on caring for yourself after you leave the hospital. Your doctor may also give you specific instructions. While your treatment has been planned according to the most current medical practices available, unavoidable complications occasionally occur.   ACTIVITY You may resume your regular activity, but move at a slower pace for the next 24 hours.  Take frequent rest periods for the next 24 hours.  Walking will help get rid of the air and reduce the bloated feeling in your belly (abdomen).  No driving for 24 hours (because of the medicine (anesthesia) used during the test).   Do not sign any important legal documents or operate any machinery for 24 hours (because of the anesthesia used during the test).  NUTRITION Drink plenty of fluids.  You may resume your normal diet as instructed by your doctor.  Begin with a light meal and progress to your normal diet. Heavy or fried foods are harder to digest and may make you feel sick to your stomach (nauseated).  Avoid alcoholic beverages for 24 hours or as instructed.  MEDICATIONS You may resume your normal medications unless your doctor tells you otherwise.  WHAT YOU CAN EXPECT TODAY Some feelings of bloating in the abdomen.  Passage of more gas than usual.  Spotting of blood in your stool or on the toilet paper.  IF YOU HAD POLYPS REMOVED DURING THE COLONOSCOPY: No aspirin products for 7 days or as instructed.  No alcohol for 7 days or as instructed.  Eat a soft diet for the next 24 hours.  FINDING OUT THE RESULTS OF YOUR TEST Not all test results are  available during your visit. If your test results are not back during the visit, make an appointment with your caregiver to find out the results. Do not assume everything is normal if you have not heard from your caregiver or the medical facility. It is important for you to follow up on all of your test results.  SEEK IMMEDIATE MEDICAL ATTENTION IF: You have more than a spotting of  blood in your stool.  Your belly is swollen (abdominal distention).  You are nauseated or vomiting.  You have a temperature over 101.  You have abdominal pain or discomfort that is severe or gets worse throughout the day.   Your EGD revealed mild amount inflammation in your stomach.  I took biopsies of this to rule out infection with a bacteria called H. pylori.    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  I took biopsies of this as well.  Await pathology results, office will contact you.  Small bowel appeared normal.  I am going to increase your pantoprazole to twice daily and have sent this to your pharmacy.  Your colonoscopy was relatively unremarkable.  I did not find any polyps or evidence of colon cancer.  I recommend repeating colonoscopy in 10 years for colon cancer screening purposes.    You do have internal hemorrhoids. I would recommend increasing fiber in your diet or adding OTC Benefiber/Metamucil. Be sure to drink at least 4 to 6 glasses of water daily. Follow-up with GI in 2-3 months   I hope you have a great rest of your week!  Hennie Duos. Marletta Lor, D.O. Gastroenterology and Hepatology [facility]

## 2023-03-31 NOTE — Anesthesia Preprocedure Evaluation (Signed)
Anesthesia Evaluation  Patient identified by MRN, date of birth, ID band Patient awake    Reviewed: Allergy & Precautions, H&P , NPO status , Patient's Chart, lab work & pertinent test results, reviewed documented beta blocker date and time   Airway Mallampati: II  TM Distance: >3 FB Neck ROM: full    Dental no notable dental hx.    Pulmonary neg pulmonary ROS, asthma , COPD, Current Smoker   Pulmonary exam normal breath sounds clear to auscultation       Cardiovascular Exercise Tolerance: Good hypertension, negative cardio ROS  Rhythm:regular Rate:Normal     Neuro/Psych Seizures -,  PSYCHIATRIC DISORDERS  Depression    negative neurological ROS  negative psych ROS   GI/Hepatic negative GI ROS, Neg liver ROS,,,  Endo/Other  negative endocrine ROSdiabetes    Renal/GU Renal diseasenegative Renal ROS  negative genitourinary   Musculoskeletal   Abdominal   Peds  Hematology negative hematology ROS (+)   Anesthesia Other Findings   Reproductive/Obstetrics negative OB ROS                             Anesthesia Physical Anesthesia Plan  ASA: 3  Anesthesia Plan: General   Post-op Pain Management:    Induction:   PONV Risk Score and Plan: Propofol infusion  Airway Management Planned:   Additional Equipment:   Intra-op Plan:   Post-operative Plan:   Informed Consent: I have reviewed the patients History and Physical, chart, labs and discussed the procedure including the risks, benefits and alternatives for the proposed anesthesia with the patient or authorized representative who has indicated his/her understanding and acceptance.     Dental Advisory Given  Plan Discussed with: CRNA  Anesthesia Plan Comments:        Anesthesia Quick Evaluation

## 2023-03-31 NOTE — Anesthesia Postprocedure Evaluation (Signed)
Anesthesia Post Note  Patient: Matthew Dougherty  Procedure(s) Performed: COLONOSCOPY WITH PROPOFOL ESOPHAGOGASTRODUODENOSCOPY (EGD) WITH PROPOFOL BIOPSY  Patient location during evaluation: Phase II Anesthesia Type: General Level of consciousness: awake Pain management: pain level controlled Vital Signs Assessment: post-procedure vital signs reviewed and stable Respiratory status: spontaneous breathing and respiratory function stable Cardiovascular status: blood pressure returned to baseline and stable Postop Assessment: no headache and no apparent nausea or vomiting Anesthetic complications: no Comments: Late entry   No notable events documented.   Last Vitals:  Vitals:   03/31/23 0924 03/31/23 1111  BP: 116/79 105/64  Pulse: (!) 107 97  Resp: (!) 21 (!) 25  Temp: 36.8 C 36.6 C  SpO2: 93% 100%    Last Pain:  Vitals:   03/31/23 1111  TempSrc: Oral  PainSc: 0-No pain                 Windell Norfolk

## 2023-03-31 NOTE — Anesthesia Procedure Notes (Signed)
Date/Time: 03/31/2023 10:35 AM  Performed by: Franco Nones, CRNAPre-anesthesia Checklist: Patient identified, Emergency Drugs available, Suction available, Timeout performed and Patient being monitored Patient Re-evaluated:Patient Re-evaluated prior to induction Oxygen Delivery Method: Nasal Cannula

## 2023-04-01 LAB — SURGICAL PATHOLOGY

## 2023-04-24 ENCOUNTER — Encounter (HOSPITAL_COMMUNITY): Payer: Self-pay | Admitting: Internal Medicine

## 2023-06-03 ENCOUNTER — Encounter: Payer: Self-pay | Admitting: Gastroenterology
